# Patient Record
Sex: Male | Born: 1965
Health system: Southern US, Community
[De-identification: ages and names within clinical notes are randomized; demographics above are authoritative.]

## PROBLEM LIST (undated history)

## (undated) DIAGNOSIS — R399 Unspecified symptoms and signs involving the genitourinary system: Secondary | ICD-10-CM

## (undated) DIAGNOSIS — K449 Diaphragmatic hernia without obstruction or gangrene: Secondary | ICD-10-CM

## (undated) DIAGNOSIS — N201 Calculus of ureter: Secondary | ICD-10-CM

## (undated) DIAGNOSIS — Z9989 Dependence on other enabling machines and devices: Secondary | ICD-10-CM

## (undated) DIAGNOSIS — I4510 Unspecified right bundle-branch block: Secondary | ICD-10-CM

## (undated) DIAGNOSIS — K219 Gastro-esophageal reflux disease without esophagitis: Secondary | ICD-10-CM

## (undated) DIAGNOSIS — J189 Pneumonia, unspecified organism: Secondary | ICD-10-CM

## (undated) DIAGNOSIS — I2510 Atherosclerotic heart disease of native coronary artery without angina pectoris: Secondary | ICD-10-CM

## (undated) DIAGNOSIS — G4733 Obstructive sleep apnea (adult) (pediatric): Secondary | ICD-10-CM

## (undated) DIAGNOSIS — J309 Allergic rhinitis, unspecified: Secondary | ICD-10-CM

## (undated) DIAGNOSIS — T7840XA Allergy, unspecified, initial encounter: Secondary | ICD-10-CM

## (undated) DIAGNOSIS — E785 Hyperlipidemia, unspecified: Secondary | ICD-10-CM

## (undated) DIAGNOSIS — D649 Anemia, unspecified: Secondary | ICD-10-CM

## (undated) DIAGNOSIS — Z87442 Personal history of urinary calculi: Secondary | ICD-10-CM

## (undated) DIAGNOSIS — N529 Male erectile dysfunction, unspecified: Secondary | ICD-10-CM

## (undated) DIAGNOSIS — Z862 Personal history of diseases of the blood and blood-forming organs and certain disorders involving the immune mechanism: Secondary | ICD-10-CM

## (undated) DIAGNOSIS — G4730 Sleep apnea, unspecified: Secondary | ICD-10-CM

## (undated) DIAGNOSIS — N200 Calculus of kidney: Secondary | ICD-10-CM

## (undated) DIAGNOSIS — N400 Enlarged prostate without lower urinary tract symptoms: Secondary | ICD-10-CM

## (undated) HISTORY — PX: COLONOSCOPY: SHX174

## (undated) HISTORY — PX: POLYPECTOMY: SHX149

## (undated) HISTORY — DX: Allergy, unspecified, initial encounter: T78.40XA

## (undated) HISTORY — DX: Obstructive sleep apnea (adult) (pediatric): G47.33

## (undated) HISTORY — DX: Anemia, unspecified: D64.9

## (undated) HISTORY — DX: Sleep apnea, unspecified: G47.30

---

## 1997-05-29 HISTORY — PX: FOOT GANGLION EXCISION: SHX1660

## 2004-06-27 ENCOUNTER — Ambulatory Visit: Payer: Self-pay | Admitting: Internal Medicine

## 2004-11-01 ENCOUNTER — Ambulatory Visit: Payer: Self-pay | Admitting: Internal Medicine

## 2005-09-14 ENCOUNTER — Ambulatory Visit: Payer: Self-pay | Admitting: Internal Medicine

## 2006-06-28 ENCOUNTER — Ambulatory Visit: Payer: Self-pay | Admitting: Internal Medicine

## 2006-12-28 ENCOUNTER — Ambulatory Visit: Payer: Self-pay | Admitting: Internal Medicine

## 2006-12-28 DIAGNOSIS — K219 Gastro-esophageal reflux disease without esophagitis: Secondary | ICD-10-CM

## 2006-12-28 DIAGNOSIS — L255 Unspecified contact dermatitis due to plants, except food: Secondary | ICD-10-CM | POA: Insufficient documentation

## 2006-12-28 HISTORY — DX: Gastro-esophageal reflux disease without esophagitis: K21.9

## 2006-12-31 ENCOUNTER — Emergency Department (HOSPITAL_COMMUNITY): Admission: EM | Admit: 2006-12-31 | Discharge: 2006-12-31 | Payer: Self-pay | Admitting: Emergency Medicine

## 2007-01-01 ENCOUNTER — Ambulatory Visit: Payer: Self-pay | Admitting: Internal Medicine

## 2007-01-01 DIAGNOSIS — J309 Allergic rhinitis, unspecified: Secondary | ICD-10-CM | POA: Insufficient documentation

## 2007-07-01 DIAGNOSIS — J069 Acute upper respiratory infection, unspecified: Secondary | ICD-10-CM | POA: Insufficient documentation

## 2007-07-08 ENCOUNTER — Ambulatory Visit: Payer: Self-pay | Admitting: Family Medicine

## 2008-06-23 ENCOUNTER — Telehealth: Payer: Self-pay | Admitting: Internal Medicine

## 2008-11-11 ENCOUNTER — Telehealth: Payer: Self-pay | Admitting: Internal Medicine

## 2009-05-11 ENCOUNTER — Ambulatory Visit: Payer: Self-pay | Admitting: Internal Medicine

## 2009-05-11 DIAGNOSIS — J019 Acute sinusitis, unspecified: Secondary | ICD-10-CM

## 2010-04-25 ENCOUNTER — Ambulatory Visit: Payer: Self-pay | Admitting: Internal Medicine

## 2010-06-28 NOTE — Assessment & Plan Note (Signed)
Summary: sinuses//ccm   Vital Signs:  Patient profile:   45 year old male Weight:      186 pounds Temp:     98.1 degrees F oral BP sitting:   110 / 80  (left arm) Cuff size:   regular  Vitals Entered By: Duard Brady LPN (April 25, 2010 1:47 PM) CC: c/o head congestion, productive cough, ears stopped up Is Patient Diabetic? No   CC:  c/o head congestion, productive cough, and ears stopped up.  History of Present Illness: 45 year old patient has a history of seasonal allergic rhinitis.  He was treated about this time.  Last year and has recurrence of sinus congestion, pressure, minimal cough and rhinorrhea for the past several weeks.  He has been using OTC medications with marginal benefit.  There's been no fever or dental pain.  Denies any purulent drainage.  Denies any chest pain, shortness of breath or wheezing  Allergies: 1)  ! Penicillin G Potassium (Penicillin G Potassium) 2)  ! Sulf-10  Past History:  Past Medical History: Reviewed history from 01/01/2007 and no changes required. G E R D Allergic rhinitis  Physical Exam  General:  Well-developed,well-nourished,in no acute distress; alert,appropriate and cooperative throughout examination Head:  Normocephalic and atraumatic without obvious abnormalities. No apparent alopecia or balding. Eyes:  No corneal or conjunctival inflammation noted. EOMI. Perrla. Funduscopic exam benign, without hemorrhages, exudates or papilledema. Vision grossly normal. Ears:  External ear exam shows no significant lesions or deformities.  Otoscopic examination reveals clear canals, tympanic membranes are intact bilaterally without bulging, retraction, inflammation or discharge. Hearing is grossly normal bilaterally. Nose:  External nasal examination shows no deformity or inflammation. Nasal mucosa are pink and moist without lesions or exudates. Mouth:  Oral mucosa and oropharynx without lesions or exudates.  Teeth in good repair. Neck:   No deformities, masses, or tenderness noted. Lungs:  Normal respiratory effort, chest expands symmetrically. Lungs are clear to auscultation, no crackles or wheezes. Heart:  Normal rate and regular rhythm. S1 and S2 normal without gallop, murmur, click, rub or other extra sounds.   Impression & Recommendations:  Problem # 1:  ALLERGIC RHINITIS (ICD-477.9)  His updated medication list for this problem includes:    Fluticasone Propionate 50 Mcg/act Susp (Fluticasone propionate) ..... Use daily  His updated medication list for this problem includes:    Fluticasone Propionate 50 Mcg/act Susp (Fluticasone propionate) ..... Use daily  Complete Medication List: 1)  Prilosec 20 Mg Cpdr (Omeprazole) .Marland Kitchen.. 1 once daily 2)  Allegra-d 12 Hour 60-120 Mg Tb12 (Fexofenadine-pseudoephedrine) .Marland Kitchen.. 1 two times a day as needed 3)  Fluticasone Propionate 50 Mcg/act Susp (Fluticasone propionate) .... Use daily 4)  Prednisone 20 Mg Tabs (Prednisone) .... One twice daily  Other Orders: Depo- Medrol 80mg  (J1040) Admin of Therapeutic Inj  intramuscular or subcutaneous (29562)  Patient Instructions: 1)  Please schedule a follow-up appointment as needed. Prescriptions: FLUTICASONE PROPIONATE 50 MCG/ACT SUSP (FLUTICASONE PROPIONATE) use daily  #1 x 4   Entered and Authorized by:   Gordy Savers  MD   Signed by:   Gordy Savers  MD on 04/25/2010   Method used:   Print then Give to Patient   RxID:   [id number] PREDNISONE 20 MG TABS (PREDNISONE) one twice daily  #14 x 0   Entered and Authorized by:   Gordy Savers  MD   Signed by:   Gordy Savers  MD on 04/25/2010   Method used:   Print then  Give to Patient   RxID:   [id number] FLUTICASONE PROPIONATE 50 MCG/ACT SUSP (FLUTICASONE PROPIONATE) use daily  #1 x 4   Entered and Authorized by:   Gordy Savers  MD   Signed by:   Gordy Savers  MD on 04/25/2010   Method used:   Electronically to        [facility] [address] [id number]* (retail)       [address]       [place], [place]  [address]       Ph: [phone]       Fax: [phone]   RxID:   [id number]    Medication Administration  Injection # 1:    Medication: Depo- Medrol 80mg     Diagnosis: SINUSITIS- ACUTE-NOS (ICD-461.9)    Route: IM    Site: R deltoid    Exp Date: 11/2012    Lot #: [id number] [id number]    Mfr: Pharmacia    Patient tolerated injection without complications    Given by: Duard Brady LPN (April 25, 2010 2:28 PM)  Orders Added: 1)  Est. Patient Level III [93235] 2)  Depo- Medrol 80mg  [J1040] 3)  Admin of Therapeutic Inj  intramuscular or subcutaneous [57322]

## 2010-06-29 ENCOUNTER — Telehealth: Payer: Self-pay | Admitting: Internal Medicine

## 2010-06-29 DIAGNOSIS — K219 Gastro-esophageal reflux disease without esophagitis: Secondary | ICD-10-CM

## 2010-06-29 MED ORDER — OMEPRAZOLE 20 MG PO CPDR: 20.0000 mg | DELAYED_RELEASE_CAPSULE | Freq: Every day | ORAL | Status: DC

## 2010-06-29 NOTE — Telephone Encounter (Signed)
REFILL REQUEST:  OMEPRAZOLE

## 2010-07-01 ENCOUNTER — Other Ambulatory Visit: Payer: Self-pay | Admitting: Internal Medicine

## 2010-07-01 DIAGNOSIS — K219 Gastro-esophageal reflux disease without esophagitis: Secondary | ICD-10-CM

## 2010-07-01 MED ORDER — OMEPRAZOLE 20 MG PO CPDR: 20.0000 mg | DELAYED_RELEASE_CAPSULE | Freq: Every day | ORAL | Status: DC

## 2010-07-01 NOTE — Telephone Encounter (Signed)
Pt wants Rx for omeprazole sent to [facility]...... 90-day supply....was supposed to have been sent to [facility] not [facility].

## 2010-07-06 ENCOUNTER — Telehealth: Payer: Self-pay | Admitting: Internal Medicine

## 2010-07-06 DIAGNOSIS — K219 Gastro-esophageal reflux disease without esophagitis: Secondary | ICD-10-CM

## 2010-07-06 MED ORDER — OMEPRAZOLE 20 MG PO CPDR: 20.0000 mg | DELAYED_RELEASE_CAPSULE | Freq: Every day | ORAL | Status: DC

## 2010-07-06 NOTE — Telephone Encounter (Signed)
Pt called and said that Omeprazole was sent to incorrect pharmacy. Pt is req to pick up written script at office so he cant send to [facility]. Pt req 28month supply for 1 year. Pls notify pt when script is ready for pick up.

## 2010-07-06 NOTE — Telephone Encounter (Signed)
Pt states [facility] did not recv omeprazole efill . I resent rx today - instructed pt to check with [facility] in 2 hours to see if they recv'd it

## 2010-07-12 ENCOUNTER — Other Ambulatory Visit: Payer: Self-pay | Admitting: Internal Medicine

## 2010-07-12 DIAGNOSIS — K219 Gastro-esophageal reflux disease without esophagitis: Secondary | ICD-10-CM

## 2010-08-25 ENCOUNTER — Encounter: Payer: Self-pay | Admitting: Internal Medicine

## 2010-08-25 ENCOUNTER — Ambulatory Visit (INDEPENDENT_AMBULATORY_CARE_PROVIDER_SITE_OTHER): Payer: 59 | Admitting: Internal Medicine

## 2010-08-25 VITALS — BP 110/72 | Temp 98.5°F | Wt 189.0 lb

## 2010-08-25 DIAGNOSIS — S76219A Strain of adductor muscle, fascia and tendon of unspecified thigh, initial encounter: Secondary | ICD-10-CM

## 2010-08-25 DIAGNOSIS — IMO0002 Reserved for concepts with insufficient information to code with codable children: Secondary | ICD-10-CM

## 2010-08-25 NOTE — Progress Notes (Signed)
  Subjective:    Patient ID: Lucas Rocha, male    DOB: 01-04-1966, 45 y.o.   MRN: [id number]  HPI  45 year old patient who presents with right groin pain of 2-3 days duration. He he feels this began after he started jogging with his daughter. He describes a sharp localized paroxysmal pain in the right groin that is intermittent. Symptoms seem  to be aggravated by taking a longer stride than  normal. Review of Systems  Constitutional: Negative for fever, chills, appetite change and fatigue.  HENT: Negative for hearing loss, ear pain, congestion, sore throat, trouble swallowing, neck stiffness, dental problem, voice change and tinnitus.   Eyes: Negative for pain, discharge and visual disturbance.  Respiratory: Negative for cough, chest tightness, wheezing and stridor.   Cardiovascular: Negative for chest pain, palpitations and leg swelling.  Gastrointestinal: Negative for nausea, vomiting, abdominal pain, diarrhea, constipation, blood in stool and abdominal distention.  Genitourinary: Negative for urgency, hematuria, flank pain, discharge, difficulty urinating and genital sores.  Musculoskeletal: Positive for gait problem. Negative for myalgias, back pain, joint swelling and arthralgias.  Skin: Negative for rash.  Neurological: Negative for dizziness, syncope, speech difficulty, weakness, numbness and headaches.  Hematological: Negative for adenopathy. Does not bruise/bleed easily.  Psychiatric/Behavioral: Negative for behavioral problems and dysphoric mood. The patient is not nervous/anxious.        Objective:   Physical Exam  Constitutional: He appears well-developed and well-nourished. No distress.  Abdominal: Soft. Bowel sounds are normal.       Examination of the right groin area revealed no abnormalities. There is no evidence of hernia formation and no localized tenderness no signs of active inflammation Genitourinary exam revealed no evidence of a hernia          Assessment &  Plan:  Right groin strain. Will treat with rest gentle stretching and anti-inflammatory medications. Patient will notify us if there is not clinical improvement quickly

## 2010-08-25 NOTE — Patient Instructions (Signed)
Take Aleve 200 mg twice daily for pain or swelling  Rest and avoid strenuous activities Gentle stretching prior to exercise  Call or return to clinic prn if these symptoms worsen or fail to improve as anticipated.

## 2010-11-10 ENCOUNTER — Encounter: Payer: Self-pay | Admitting: Internal Medicine

## 2010-11-10 ENCOUNTER — Ambulatory Visit (INDEPENDENT_AMBULATORY_CARE_PROVIDER_SITE_OTHER): Payer: 59 | Admitting: Internal Medicine

## 2010-11-10 VITALS — BP 130/80 | Temp 99.9°F | Wt 180.0 lb

## 2010-11-10 DIAGNOSIS — A088 Other specified intestinal infections: Secondary | ICD-10-CM

## 2010-11-10 DIAGNOSIS — A084 Viral intestinal infection, unspecified: Secondary | ICD-10-CM

## 2010-11-10 MED ORDER — DIPHENOXYLATE-ATROPINE 2.5-0.025 MG PO TABS: 1.0000 | ORAL_TABLET | Freq: Four times a day (QID) | ORAL | Status: DC | PRN

## 2010-11-10 NOTE — Patient Instructions (Signed)
The main concern with gastroenteritis is dehydration.  Drink  plenty of  fluids, and advance your diet  slowly to solids as you feel improved.  If you are unable to keep anything down or  Show  signs of dehydration such as dry, cracked lips, or  not urinating, please notify our office.  Call or return to clinic prn if these symptoms worsen or fail to improve as anticipated.

## 2010-11-10 NOTE — Progress Notes (Signed)
  Subjective:    Patient ID: Lucas Rocha, male    DOB: 1966/05/11, 45 y.o.   MRN: [id number]  HPI   45 year old patient who presents with a five-day history of diarrhea.  He has had no vomiting but slight nausea. No significant abdominal pain. Has noticed some mild cramping associated with the diarrhea. He had nocturnal diarrhea last night x2. He has felt  a bit unwell.  No recent antibiotic use. No GI illness in the household. He has taken Pepto-Bismol recently with some improvement   Review of Systems  Constitutional: Negative for fever, chills, appetite change and fatigue.  HENT: Negative for hearing loss, ear pain, congestion, sore throat, trouble swallowing, neck stiffness, dental problem, voice change and tinnitus.   Eyes: Negative for pain, discharge and visual disturbance.  Respiratory: Negative for cough, chest tightness, wheezing and stridor.   Cardiovascular: Negative for chest pain, palpitations and leg swelling.  Gastrointestinal: Positive for diarrhea. Negative for nausea, vomiting, abdominal pain, constipation, blood in stool and abdominal distention.  Genitourinary: Negative for urgency, hematuria, flank pain, discharge, difficulty urinating and genital sores.  Musculoskeletal: Negative for myalgias, back pain, joint swelling, arthralgias and gait problem.  Skin: Negative for rash.  Neurological: Negative for dizziness, syncope, speech difficulty, weakness, numbness and headaches.  Hematological: Negative for adenopathy. Does not bruise/bleed easily.  Psychiatric/Behavioral: Negative for behavioral problems and dysphoric mood. The patient is not nervous/anxious.        Objective:   Physical Exam  Constitutional: He is oriented to person, place, and time. He appears well-developed.       Temperature 99.9. Blood pressure 130/80. Pulse 80  HENT:  Head: Normocephalic.  Right Ear: External ear normal.  Left Ear: External ear normal.  Mouth/Throat: Oropharynx is clear and  moist.  Eyes: Conjunctivae and EOM are normal.  Neck: Normal range of motion.  Cardiovascular: Normal rate and normal heart sounds.   Pulmonary/Chest: Breath sounds normal.  Abdominal: Soft. Bowel sounds are normal. He exhibits no distension. There is no tenderness. There is no guarding.  Musculoskeletal: Normal range of motion. He exhibits no edema and no tenderness.  Neurological: He is alert and oriented to person, place, and time.  Psychiatric: He has a normal mood and affect. His behavior is normal.          Assessment & Plan:   Viral gastroenteritis. Will continue Pepto-Bismol. We'll provide a prescription for antidiarrheal. We'll treat with ALIGN for 7 days and continue PPI therapy.

## 2011-06-14 ENCOUNTER — Other Ambulatory Visit: Payer: Self-pay | Admitting: Internal Medicine

## 2011-10-24 ENCOUNTER — Ambulatory Visit (INDEPENDENT_AMBULATORY_CARE_PROVIDER_SITE_OTHER): Payer: 59 | Admitting: Internal Medicine

## 2011-10-24 ENCOUNTER — Encounter: Payer: Self-pay | Admitting: Internal Medicine

## 2011-10-24 VITALS — BP 116/70 | HR 90 | Temp 98.5°F | Resp 18 | Ht 71.0 in | Wt 177.0 lb

## 2011-10-24 DIAGNOSIS — D509 Iron deficiency anemia, unspecified: Secondary | ICD-10-CM

## 2011-10-24 DIAGNOSIS — Z23 Encounter for immunization: Secondary | ICD-10-CM

## 2011-10-24 DIAGNOSIS — K219 Gastro-esophageal reflux disease without esophagitis: Secondary | ICD-10-CM

## 2011-10-24 DIAGNOSIS — J309 Allergic rhinitis, unspecified: Secondary | ICD-10-CM

## 2011-10-24 DIAGNOSIS — D649 Anemia, unspecified: Secondary | ICD-10-CM

## 2011-10-24 DIAGNOSIS — Z299 Encounter for prophylactic measures, unspecified: Secondary | ICD-10-CM

## 2011-10-24 HISTORY — DX: Iron deficiency anemia, unspecified: D50.9

## 2011-10-24 LAB — CBC WITH DIFFERENTIAL/PLATELET
Eosinophils Absolute: 0.2 10*3/uL (ref 0.0–0.7)
HCT: 33.7 % — ABNORMAL LOW (ref 39.0–52.0)
Lymphs Abs: 1.6 10*3/uL (ref 0.7–4.0)
MCHC: 30.9 g/dL (ref 30.0–36.0)
MCV: 75.5 fl — ABNORMAL LOW (ref 78.0–100.0)
Monocytes Absolute: 0.6 10*3/uL (ref 0.1–1.0)
Neutrophils Relative %: 67.2 % (ref 43.0–77.0)
Platelets: 344 10*3/uL (ref 150.0–400.0)

## 2011-10-24 LAB — FERRITIN: Ferritin: 8 ng/mL — ABNORMAL LOW (ref 22.0–322.0)

## 2011-10-24 MED ORDER — OMEPRAZOLE 20 MG PO CPDR: 20.0000 mg | DELAYED_RELEASE_CAPSULE | Freq: Every day | ORAL | Status: DC

## 2011-10-24 MED ORDER — FLUTICASONE PROPIONATE 50 MCG/ACT NA SUSP: 2.0000 | Freq: Every day | NASAL | Status: DC

## 2011-10-24 NOTE — Progress Notes (Signed)
Subjective:    Patient ID: Lucas Rocha, male    DOB: 1966/02/12, 46 y.o.   MRN: [id number]  HPI  46 year old patient who is seen today for a health maintenance examination. He had a employment physical done recently that revealed the moderate anemia.  Recent hemoglobin was noted to be 10.8. At the time of his last employment physical in May of 2011 hemoglobin 14.6.  He does complain of some diminished exercise capacity and some chronic fatigue. Prior to the diagnosis of anemia he was exercising vigorously 4 times per week but again noted some poor exercise capacity he also noted some episodes of weakness and dizziness during exercise. He has no prior history of anemia and no family history of anemia or any hematologic disorder. He does donate blood on the average of twice annually. In January or February of this year his hematocrit was borderline low but his blood was accepted for donation. In the fall of last year he was not able to donate blood and apparently due to anemia. No change in his bowel habits He does have a history of GERD and does use Prilosec daily. He has rare anti-inflammatory drug use once or twice per month laboratory screen otherwise was unremarkable.  Both parents are approximate age of 2 and in good health there is a history of diabetes with the grandparents on both sides affected. One sister is well  Past Medical History  Diagnosis Date  . ALLERGIC RHINITIS 01/01/2007  . G E R D 12/28/2006    History   Social History  . Marital Status: Married    Spouse Name: N/A    Number of Children: N/A  . Years of Education: N/A   Occupational History  . Not on file.   Social History Main Topics  . Smoking status: Never Smoker   . Smokeless tobacco: Never Used  . Alcohol Use: Not on file  . Drug Use: Not on file  . Sexually Active: Not on file   Other Topics Concern  . Not on file   Social History Narrative  . No narrative on file    Past Surgical History    Procedure Date  . Ganglion cyst excision     foot    History reviewed. No pertinent family history.  Allergies  Allergen Reactions  . Penicillins   . Sulfacetamide Sodium     Current Outpatient Prescriptions on File Prior to Visit  Medication Sig Dispense Refill  . fexofenadine (ALLEGRA) 180 MG tablet Take 180 mg by mouth 2 (two) times daily as needed.        . fluticasone (FLONASE) 50 MCG/ACT nasal spray 2 sprays by Nasal route daily.        Marland Kitchen omeprazole (PRILOSEC) 20 MG capsule TAKE 1 CAPSULE DAILY  90 capsule  1    BP 116/70  Pulse 90  Temp(Src) 98.5 F (36.9 C) (Oral)  Resp 18  Ht 5\' 11"  (1.803 m)  Wt 177 lb (80.287 kg)  BMI 24.69 kg/m2  SpO2 98%        Review of Systems  Constitutional: Positive for fatigue. Negative for fever, chills, activity change and appetite change.  HENT: Negative for hearing loss, ear pain, congestion, rhinorrhea, sneezing, mouth sores, trouble swallowing, neck pain, neck stiffness, dental problem, voice change, sinus pressure and tinnitus.   Eyes: Negative for photophobia, pain, redness and visual disturbance.  Respiratory: Negative for apnea, cough, choking, chest tightness, shortness of breath and wheezing.   Cardiovascular: Negative for  chest pain, palpitations and leg swelling.  Gastrointestinal: Negative for nausea, vomiting, abdominal pain, diarrhea, constipation, blood in stool, abdominal distention, anal bleeding and rectal pain.  Genitourinary: Negative for dysuria, urgency, frequency, hematuria, flank pain, decreased urine volume, discharge, penile swelling, scrotal swelling, difficulty urinating, genital sores and testicular pain.  Musculoskeletal: Negative for myalgias, back pain, joint swelling, arthralgias and gait problem.  Skin: Negative for color change, rash and wound.  Neurological: Positive for light-headedness. Negative for dizziness, tremors, seizures, syncope, facial asymmetry, speech difficulty, weakness, numbness  and headaches.  Hematological: Negative for adenopathy. Does not bruise/bleed easily.  Psychiatric/Behavioral: Negative for suicidal ideas, hallucinations, behavioral problems, confusion, sleep disturbance, self-injury, dysphoric mood, decreased concentration and agitation. The patient is not nervous/anxious.        Objective:   Physical Exam  Constitutional: He appears well-developed and well-nourished.  HENT:  Head: Normocephalic and atraumatic.  Right Ear: External ear normal.  Left Ear: External ear normal.  Nose: Nose normal.  Mouth/Throat: Oropharynx is clear and moist.  Eyes: Conjunctivae and EOM are normal. Pupils are equal, round, and reactive to light. No scleral icterus.  Neck: Normal range of motion. Neck supple. No JVD present. No thyromegaly present.  Cardiovascular: Regular rhythm, normal heart sounds and intact distal pulses.  Exam reveals no gallop and no friction rub.   No murmur heard. Pulmonary/Chest: Effort normal and breath sounds normal. He exhibits no tenderness.  Abdominal: Soft. Bowel sounds are normal. He exhibits no distension and no mass. There is no tenderness.  Genitourinary: Rectum normal, prostate normal and penis normal. Guaiac negative stool.  Musculoskeletal: Normal range of motion. He exhibits no edema and no tenderness.  Lymphadenopathy:    He has no cervical adenopathy.  Neurological: He is alert. He has normal reflexes. No cranial nerve deficit. Coordination normal.  Skin: Skin is warm and dry. No rash noted.  Psychiatric: He has a normal mood and affect. His behavior is normal.          Assessment & Plan:   Preventive health examination Moderate anemia. Will check stool for occult blood;  check a ferritin and B12 level Consider GI evaluation

## 2011-10-24 NOTE — Patient Instructions (Signed)
Return in one month for follow-up  

## 2011-10-25 ENCOUNTER — Other Ambulatory Visit: Payer: Self-pay | Admitting: Internal Medicine

## 2011-10-25 DIAGNOSIS — D509 Iron deficiency anemia, unspecified: Secondary | ICD-10-CM

## 2011-10-25 NOTE — Progress Notes (Signed)
Quick Note:  Spoke with wife - informed of results and GI consult to be done - order placed ______

## 2011-10-26 LAB — METHYLMALONIC ACID, SERUM: Methylmalonic Acid, Quant: 0.17 umol/L (ref <0.40)

## 2011-10-27 ENCOUNTER — Encounter: Payer: Self-pay | Admitting: Internal Medicine

## 2011-11-08 ENCOUNTER — Other Ambulatory Visit: Payer: Self-pay

## 2011-11-08 MED ORDER — OMEPRAZOLE 20 MG PO CPDR: 20.0000 mg | DELAYED_RELEASE_CAPSULE | Freq: Every day | ORAL | Status: DC

## 2011-11-08 NOTE — Telephone Encounter (Signed)
Attempt to call at # left by pt - sent new rx to [facility] - check with them later today to make sure they got it

## 2011-11-17 ENCOUNTER — Ambulatory Visit: Payer: 59 | Admitting: Internal Medicine

## 2011-11-24 ENCOUNTER — Encounter: Payer: Self-pay | Admitting: Internal Medicine

## 2011-11-27 ENCOUNTER — Encounter: Payer: Self-pay | Admitting: Internal Medicine

## 2011-11-27 ENCOUNTER — Ambulatory Visit (INDEPENDENT_AMBULATORY_CARE_PROVIDER_SITE_OTHER): Payer: 59 | Admitting: Internal Medicine

## 2011-11-27 ENCOUNTER — Other Ambulatory Visit (INDEPENDENT_AMBULATORY_CARE_PROVIDER_SITE_OTHER): Payer: 59

## 2011-11-27 VITALS — BP 118/64 | HR 80 | Ht 71.0 in | Wt 180.0 lb

## 2011-11-27 DIAGNOSIS — K219 Gastro-esophageal reflux disease without esophagitis: Secondary | ICD-10-CM

## 2011-11-27 DIAGNOSIS — D509 Iron deficiency anemia, unspecified: Secondary | ICD-10-CM

## 2011-11-27 LAB — IGA: IgA: 400 mg/dL — ABNORMAL HIGH (ref 68–378)

## 2011-11-27 LAB — TSH: TSH: 0.73 u[IU]/mL (ref 0.35–5.50)

## 2011-11-27 MED ORDER — INTEGRA PLUS PO CAPS: 1.0000 | ORAL_CAPSULE | Freq: Every day | ORAL | Status: DC

## 2011-11-27 NOTE — Patient Instructions (Addendum)
You have been scheduled for a Endoscopy/ colonoscopy with propofol. Please follow written instructions given to you at your visit today.  Please pick up your prep kit at the pharmacy within the next 1-3 days.  Your physician has requested that you go to the basement for  lab work before leaving today.  We have sent the following medications to your pharmacy for you to pick up at your convenience:integra; take as directed.

## 2011-11-27 NOTE — Progress Notes (Signed)
Patient ID: Lucas Rocha, male   DOB: 05-12-1966, 46 y.o.   MRN: [id number]  SUBJECTIVE: HPI Lucas Rocha is a 46 year old male with a past medical history of GERD, allergic rhinitis, and a relatively new anemia who seen in consultation at the request of Dr. Amador Cunas for evaluation of iron deficiency anemia. Patient reports he is feeling well has had no complaints. He reports first being told about his anemia at a work-related physical. He then attempted to donate blood, and was told he was anemic with low iron and therefore could not donate again. He denies any GI bleeding. He reports no bright red blood per rectum or melena. He reports no abdominal pain. He does have a history of heartburn, but this is completely controlled with once daily omeprazole. He denies dysphagia, odynophagia, nausea, vomiting. His appetite is good. His weight is stable. His bowel habits are normal for him. No fevers or chills. No blood in his urine. No easy bruising.  He had noted some fatigue when trying to work out.  Review of Systems  As per history of present illness, otherwise negative   Past Medical History  Diagnosis Date  . ALLERGIC RHINITIS 01/01/2007  . G E R D 12/28/2006  . Anemia     Current Outpatient Prescriptions  Medication Sig Dispense Refill  . ferrous sulfate 325 (65 FE) MG tablet Take 325 mg by mouth. Will take but not all the time      . fexofenadine (ALLEGRA) 180 MG tablet Take 180 mg by mouth 2 (two) times daily as needed.        . fluticasone (FLONASE) 50 MCG/ACT nasal spray Place 2 sprays into the nose daily.  16 g  6  . omeprazole (PRILOSEC) 20 MG capsule Take 1 capsule (20 mg total) by mouth daily.  90 capsule  6    Allergies  Allergen Reactions  . Penicillins   . Sulfacetamide Sodium     Family History  Problem Relation Age of Onset  . Colon cancer Neg Hx   . Diabetes Maternal Grandfather   . Heart disease      Dad's side     History  Substance Use Topics  . Smoking  status: Never Smoker   . Smokeless tobacco: Never Used  . Alcohol Use: No    OBJECTIVE: BP 118/64  Pulse 80  Ht 5\' 11"  (1.803 m)  Wt 180 lb (81.647 kg)  BMI 25.10 kg/m2 Constitutional: Well-developed and well-nourished. No distress. HEENT: Normocephalic and atraumatic. Oropharynx is clear and moist. No oropharyngeal exudate. Conjunctivae are normal. Pupils are equal round and reactive to light. No scleral icterus. Neck: Neck supple. Trachea midline. Cardiovascular: Normal rate, regular rhythm and intact distal pulses. No M/R/G Pulmonary/chest: Effort normal and breath sounds normal. No wheezing, rales or rhonchi. Abdominal: Soft, nontender, nondistended. Bowel sounds active throughout. There are no masses palpable. No hepatosplenomegaly. Extremities: no clubbing, cyanosis, or edema Lymphadenopathy: No cervical adenopathy noted. Neurological: Alert and oriented to person place and time. Skin: Skin is warm and dry. No rashes noted. Psychiatric: Normal mood and affect. Behavior is normal.  Labs and Imaging -- Iron/TIBC/Ferritin    Component Value Date/Time   FERRITIN 8.0* 10/24/2011 1421   CBC    Component Value Date/Time   WBC 7.1 10/24/2011 1421   RBC 4.46 10/24/2011 1421   HGB 10.4* 10/24/2011 1421   HCT 33.7* 10/24/2011 1421   PLT 344.0 10/24/2011 1421   MCV 75.5* 10/24/2011 1421   MCHC 30.9 10/24/2011  1421   RDW 16.9* 10/24/2011 1421   LYMPHSABS 1.6 10/24/2011 1421   MONOABS 0.6 10/24/2011 1421   EOSABS 0.2 10/24/2011 1421   BASOSABS 0.0 10/24/2011 1421    ASSESSMENT AND PLAN: Lucas Rocha is a 46 year old male with a past medical history of GERD, allergic rhinitis, and a relatively new anemia who seen in consultation at the request of Dr. Amador Cunas for evaluation of iron deficiency anemia.  1. IDA -- patient is currently asymptomatic from a GI standpoint, but definitely has an iron deficiency anemia. Given the fact that he is male, and this is felt to be relatively new, it  definitely warrants further investigation. I will order a celiac panel today as well as a TSH. His celiac panel is negative, we will proceed to EGD and colonoscopy for further evaluation of iron deficiency anemia. We discussed the test today including the risks and benefits and he is agreeable to proceed. If both of these were negative, we also discussed the possibility of a later video capsule endoscopy. He understands the workup and my recommendations. I have asked that he start Integra for iron replacement 1 capsule daily. He will take this for 3 months, and we can recheck his iron stores. Further recommendations after labs and procedures.  2. GERD -- his heartburn and GERD symptoms are well controlled on omeprazole. He'll continue omeprazole 20 mg daily for now.

## 2011-11-28 ENCOUNTER — Ambulatory Visit (AMBULATORY_SURGERY_CENTER): Payer: 59 | Admitting: Internal Medicine

## 2011-11-28 ENCOUNTER — Encounter: Payer: Self-pay | Admitting: Internal Medicine

## 2011-11-28 ENCOUNTER — Other Ambulatory Visit: Payer: Self-pay | Admitting: Internal Medicine

## 2011-11-28 VITALS — BP 117/81 | HR 83 | Temp 97.8°F | Resp 22 | Ht 71.0 in | Wt 180.0 lb

## 2011-11-28 DIAGNOSIS — D509 Iron deficiency anemia, unspecified: Secondary | ICD-10-CM

## 2011-11-28 DIAGNOSIS — D126 Benign neoplasm of colon, unspecified: Secondary | ICD-10-CM

## 2011-11-28 DIAGNOSIS — K317 Polyp of stomach and duodenum: Secondary | ICD-10-CM

## 2011-11-28 DIAGNOSIS — K635 Polyp of colon: Secondary | ICD-10-CM

## 2011-11-28 DIAGNOSIS — K219 Gastro-esophageal reflux disease without esophagitis: Secondary | ICD-10-CM

## 2011-11-28 DIAGNOSIS — D1330 Benign neoplasm of unspecified part of small intestine: Secondary | ICD-10-CM

## 2011-11-28 DIAGNOSIS — D131 Benign neoplasm of stomach: Secondary | ICD-10-CM

## 2011-11-28 HISTORY — PX: ESOPHAGOGASTRODUODENOSCOPY: SHX1529

## 2011-11-28 MED ORDER — SODIUM CHLORIDE 0.9 % IV SOLN: 500.0000 mL | INTRAVENOUS | Status: DC

## 2011-11-28 MED ORDER — PANTOPRAZOLE SODIUM 40 MG PO TBEC: 40.0000 mg | DELAYED_RELEASE_TABLET | Freq: Every day | ORAL | Status: DC

## 2011-11-28 NOTE — Patient Instructions (Addendum)
YOU HAD AN ENDOSCOPIC PROCEDURE TODAY AT THE [facility]: Refer to the procedure report that was given to you for any specific questions about what was found during the examination.  If the procedure report does not answer your questions, please call your gastroenterologist to clarify.  If you requested that your care partner not be given the details of your procedure findings, then the procedure report has been included in a sealed envelope for you to review at your convenience later.  YOU SHOULD EXPECT: Some feelings of bloating in the abdomen. Passage of more gas than usual.  Walking can help get rid of the air that was put into your GI tract during the procedure and reduce the bloating. If you had a lower endoscopy (such as a colonoscopy or flexible sigmoidoscopy) you may notice spotting of blood in your stool or on the toilet paper. If you underwent a bowel prep for your procedure, then you may not have a normal bowel movement for a few days.  DIET: Your first meal following the procedure should be a light meal and then it is ok to progress to your normal diet.  A half-sandwich or bowl of soup is an example of a good first meal.  Heavy or fried foods are harder to digest and may make you feel nauseous or bloated.  Likewise meals heavy in dairy and vegetables can cause extra gas to form and this can also increase the bloating.  Drink plenty of fluids but you should avoid alcoholic beverages for 24 hours.  ACTIVITY: Your care partner should take you home directly after the procedure.  You should plan to take it easy, moving slowly for the rest of the day.  You can resume normal activity the day after the procedure however you should NOT DRIVE or use heavy machinery for 24 hours (because of the sedation medicines used during the test).    SYMPTOMS TO REPORT IMMEDIATELY: A gastroenterologist can be reached at any hour.  During normal business hours, 8:30 AM to 5:00 PM Monday through Friday,  call [phone].  After hours and on weekends, please call the GI answering service at [phone] who will take a message and have the physician on call contact you.   Following lower endoscopy (colonoscopy or flexible sigmoidoscopy):  Excessive amounts of blood in the stool  Significant tenderness or worsening of abdominal pains  Swelling of the abdomen that is new, acute  Fever of 100F or higher  Following upper endoscopy (EGD)  Vomiting of blood or coffee ground material  New chest pain or pain under the shoulder blades  Painful or persistently difficult swallowing  New shortness of breath  Fever of 100F or higher  Black, tarry-looking stools  FOLLOW UP: If any biopsies were taken you will be contacted by phone or by letter within the next 1-3 weeks.  Call your gastroenterologist if you have not heard about the biopsies in 3 weeks.  Our staff will call the home number listed on your records the next business day following your procedure to check on you and address any questions or concerns that you may have at that time regarding the information given to you following your procedure. This is a courtesy call and so if there is no answer at the home number and we have not heard from you through the emergency physician on call, we will assume that you have returned to your regular daily activities without incident.  SIGNATURES/CONFIDENTIALITY: You and/or your care   partner have signed paperwork which will be entered into your electronic medical record.  These signatures attest to the fact that that the information above on your After Visit Summary has been reviewed and is understood.  Full responsibility of the confidentiality of this discharge information lies with you and/or your care-partner.  

## 2011-11-28 NOTE — Progress Notes (Signed)
Patient did not experience any of the following events: a burn prior to discharge; a fall within the facility; wrong site/side/patient/procedure/implant event; or a hospital transfer or hospital admission upon discharge from the facility. (G8907) Patient did not have preoperative order for IV antibiotic SSI prophylaxis. (G8918)  

## 2011-11-28 NOTE — Op Note (Signed)
[facility] [address] [place], [place]  [address]  ENDOSCOPY PROCEDURE REPORT  PATIENT:  Lucas, Rocha  MR#:  [id number] BIRTHDATE:  Feb 01, 1966, 46 yrs. old  GENDER:  male ENDOSCOPIST:  Carie Caddy. Jamica Woodyard, MD Referred by:  Eleonore Chiquito, M.D. PROCEDURE DATE:  11/28/2011 PROCEDURE:  EGD with biopsy, 43239 ASA CLASS:  Class I INDICATIONS:  iron deficiency anemia MEDICATIONS:    MAC sedation, administered by CRNA, propofol (Diprivan) 300 mg IV TOPICAL ANESTHETIC:  none  DESCRIPTION OF PROCEDURE:   After the risks benefits and alternatives of the procedure were thoroughly explained, informed consent was obtained.  The LB GIF-H180 [id number] endoscope was introduced through the mouth and advanced to the second portion of the duodenum, without limitations.  The instrument was slowly withdrawn as the mucosa was fully examined. <<PROCEDUREIMAGES>>  Mild, LA Grade B esophagitis was found at the gastroesophageal junction.  A 4 cm hiatal hernia was found.  There were multiple sessile polyps identified in the cardia, fundus and gastric body. Multiple biopsies were obtained and sent to pathology for sampling.  The duodenal bulb was normal in appearance, as was the postbulbar duodenum. Multiple biopsies were obtained and sent to pathology to exclude celiac disease.    Retroflexed views revealed findings as previously described.    The scope was then withdrawn from the patient and the procedure completed.  COMPLICATIONS:  None  ENDOSCOPIC IMPRESSION: 1) Mild esophagitis at the gastroesophageal junction 2) Hiatal hernia 3) Polyps, multiple in the cardia, fundus, and gastric body. Several polyps sampled and sent to pathology (likely fundic gland in nature). 4) Normal duodenum  RECOMMENDATIONS: 1) Await pathology results 2) Begin daily PPI for acid suppression given esophagitis (pantoprazole 40 mg daily 30 min - 1 hour before breakfast) 3) Colonoscopy today  Joury Allcorn M.  [name], MD  CC:  Gordy Savers, MD The Patient  n. eSIGNED:   Carie Caddy. Oseph Imburgia at 11/28/2011 10:44 AM  [name], [phone]

## 2011-11-28 NOTE — Op Note (Signed)
[facility] [address] [place], [place]  [address]  COLONOSCOPY PROCEDURE REPORT  PATIENT:  Lucas, Rocha  MR#:  [id number] BIRTHDATE:  05/12/1966, 46 yrs. old  GENDER:  male ENDOSCOPIST:  Carie Caddy. Yeriel Mineo, MD REF. BY:  Eleonore Chiquito, M.D. PROCEDURE DATE:  11/28/2011 PROCEDURE:  Colonoscopy with snare polypectomy, Colon with cold biopsy polypectomy ASA CLASS:  Class I INDICATIONS:  Iron Deficiency Anemia, 1st colonoscopy MEDICATIONS:   MAC sedation, administered by CRNA, propofol (Diprivan) 180 mg IV  DESCRIPTION OF PROCEDURE:   After the risks benefits and alternatives of the procedure were thoroughly explained, informed consent was obtained.  Digital rectal exam was performed and revealed no rectal masses.   The LB CF-H180AL [id number] endoscope was introduced through the anus and advanced to the terminal ileum which was intubated for a short distance, without limitations. The quality of the prep was good, using MoviPrep.  The instrument was then slowly withdrawn as the colon was fully examined. <<PROCEDUREIMAGES>>  FINDINGS:  Normal appearing terminal ileum.  There was a possible polyp seen in the in the proximal transverse colon which was mucus-capped and measured 2 mm. The polyp was removed using cold biopsy forceps.  Two sessile polyps were found in the recto-sigmoid colon. The polyp 2 mm was removed using cold biopsy forceps.  The 5 mm polyp was snared without cautery. Retrieval was successful.  Mild diverticulosis was found in the sigmoid colon. Retroflexed views in the rectum revealed no abnormalities.     The scope was then withdrawn from the cecum and the procedure completed.  COMPLICATIONS:  None ENDOSCOPIC IMPRESSION: 1) Normal terminal ileum. 2) Possible polyp in the proximal transverse colon. Removed and sent to pathology. 3) Two polyps in the recto-sigmoid colon. Removed and sent to pathology. 4) Mild diverticulosis in the sigmoid  colon  RECOMMENDATIONS: 1) Await pathology results. 2) High fiber diet. 3) If the polyps removed today are proven to be adenomatous (pre-cancerous) polyps, you will need a repeat colonoscopy in 5 years. Otherwise you should continue to follow colorectal cancer screening guidelines for "routine risk" patients with colonoscopy in 10 years. You will receive a letter within 1-2 weeks with the results of your biopsy as well as final recommendations. Please call my office if you have not received a letter after 3 weeks. 4) Begin Integra daily now. Repeat iron studies in 3 months.  If pathology from EGD unrevealing, then will pursue video capsule study to complete evaluation for iron deficiency anemia.  Carie Caddy. [name], MD  CC:  Gordy Savers, MD The Patient  n. eSIGNED:   Carie Caddy. [name] at 11/28/2011 10:51 AM  [name], [phone]

## 2011-11-29 ENCOUNTER — Telehealth: Payer: Self-pay | Admitting: *Deleted

## 2011-11-29 NOTE — Telephone Encounter (Signed)
No answer, message left for the patient. 

## 2011-12-04 ENCOUNTER — Ambulatory Visit: Payer: 59 | Admitting: Internal Medicine

## 2011-12-06 ENCOUNTER — Telehealth: Payer: Self-pay | Admitting: Internal Medicine

## 2011-12-06 MED ORDER — TRAMADOL HCL 50 MG PO TABS: 50.0000 mg | ORAL_TABLET | Freq: Four times a day (QID) | ORAL | Status: AC | PRN

## 2011-12-06 MED ORDER — HYOSCYAMINE SULFATE 0.125 MG SL SUBL: 0.1250 mg | SUBLINGUAL_TABLET | SUBLINGUAL | Status: DC | PRN

## 2011-12-06 NOTE — Telephone Encounter (Signed)
Spoke with patient by phone and he reports 24-36 hours of a "knot" and pain in his left abdomen. Initially he felt as if he might be constipated or "bound up" and then last night he developed profuse diarrhea lasting several hours. He had not taken laxative. He did not see any blood or melena in his diarrhea last night, but he has seen small red blood clots today. This has been a small amount of bleeding. No fevers though he has felt cold. He denies pain right now but continues to describe it as a knot-like feeling and an empty feeling. He's had some nausea without vomiting. He ate 3 pieces of toast today without vomiting. No further diarrhea throughout the day.  I think he needs to be seen tomorrow in clinic. I will prescribe Levsin 0.125 mg every 4 hours as needed for cramping/spasm. I will also prescribe tramadol 50 mg to be used every 6 hours as needed for pain not better with Levsin. I've made him aware that should he have further bleeding, worsening pain, fever, vomiting that he should seek care in the ER tonight. He voices understanding.  Aram Beecham please try to schedule with Amy tomorrow for urgent visit, a.m. appointment if available

## 2011-12-06 NOTE — Telephone Encounter (Signed)
Pt called back and stated he is really in pain, still having loose stools with blood. Explained to pt Dr [name] is in procedures and hasn't had time to answer me. If he is that bad, he needs to go to the ER. I have asked Dr [name] to call him before he leaves, but I don't know when it will be. Please go to the ER if the pain is that bad- [facility] ER; pt stated understanding.

## 2011-12-06 NOTE — Telephone Encounter (Signed)
Pt had ECLon 11/28/11 : COLON with a serrated polyp and hyperplastic; EGD with - H.Pylori and sprue. Pt reports yesterday afternoon he got a "knot" in his stomach like he was hungry so he ate something. He woke up at 1 am and stayed on the toilet on and off for 3 hours. He had profuse sweating and thought he was going to pass out. He still has the knot in his stomach and continues to have loose and watery stools that now have mucus and blood in the bowl as well as blood on the paper. Pt denies a fever. He started pantoprazole on July 7 or 8; stopped the omeprazole. Please advise. Thanks.

## 2011-12-07 ENCOUNTER — Encounter: Payer: Self-pay | Admitting: Physician Assistant

## 2011-12-07 ENCOUNTER — Other Ambulatory Visit (INDEPENDENT_AMBULATORY_CARE_PROVIDER_SITE_OTHER): Payer: 59

## 2011-12-07 ENCOUNTER — Ambulatory Visit (INDEPENDENT_AMBULATORY_CARE_PROVIDER_SITE_OTHER)
Admission: RE | Admit: 2011-12-07 | Discharge: 2011-12-07 | Disposition: A | Discharge status: Home / Self Care | Payer: 59 | Source: Ambulatory Visit | Attending: Physician Assistant | Admitting: Physician Assistant

## 2011-12-07 ENCOUNTER — Ambulatory Visit (INDEPENDENT_AMBULATORY_CARE_PROVIDER_SITE_OTHER): Payer: 59 | Admitting: Physician Assistant

## 2011-12-07 ENCOUNTER — Telehealth: Payer: Self-pay | Admitting: *Deleted

## 2011-12-07 VITALS — BP 110/80 | HR 90 | Ht 71.0 in | Wt 174.4 lb

## 2011-12-07 DIAGNOSIS — D126 Benign neoplasm of colon, unspecified: Secondary | ICD-10-CM

## 2011-12-07 DIAGNOSIS — R5383 Other fatigue: Secondary | ICD-10-CM

## 2011-12-07 DIAGNOSIS — Z86010 Personal history of colonic polyps: Secondary | ICD-10-CM | POA: Insufficient documentation

## 2011-12-07 DIAGNOSIS — R634 Abnormal weight loss: Secondary | ICD-10-CM

## 2011-12-07 DIAGNOSIS — K5790 Diverticulosis of intestine, part unspecified, without perforation or abscess without bleeding: Secondary | ICD-10-CM

## 2011-12-07 DIAGNOSIS — K635 Polyp of colon: Secondary | ICD-10-CM

## 2011-12-07 DIAGNOSIS — K5730 Diverticulosis of large intestine without perforation or abscess without bleeding: Secondary | ICD-10-CM

## 2011-12-07 DIAGNOSIS — K559 Vascular disorder of intestine, unspecified: Secondary | ICD-10-CM

## 2011-12-07 DIAGNOSIS — K921 Melena: Secondary | ICD-10-CM

## 2011-12-07 DIAGNOSIS — Z860101 Personal history of adenomatous and serrated colon polyps: Secondary | ICD-10-CM | POA: Insufficient documentation

## 2011-12-07 DIAGNOSIS — D509 Iron deficiency anemia, unspecified: Secondary | ICD-10-CM

## 2011-12-07 DIAGNOSIS — R5381 Other malaise: Secondary | ICD-10-CM

## 2011-12-07 HISTORY — DX: Personal history of adenomatous and serrated colon polyps: Z86.0101

## 2011-12-07 HISTORY — DX: Diverticulosis of intestine, part unspecified, without perforation or abscess without bleeding: K57.90

## 2011-12-07 LAB — CBC WITH DIFFERENTIAL/PLATELET
Basophils Absolute: 0 10*3/uL (ref 0.0–0.1)
Basophils Relative: 0.4 % (ref 0.0–3.0)
Eosinophils Absolute: 0.2 10*3/uL (ref 0.0–0.7)
Lymphocytes Relative: 20.9 % (ref 12.0–46.0)
MCHC: 31.4 g/dL (ref 30.0–36.0)
MCV: 76.3 fl — ABNORMAL LOW (ref 78.0–100.0)
Monocytes Absolute: 0.8 10*3/uL (ref 0.1–1.0)
Neutro Abs: 6.2 10*3/uL (ref 1.4–7.7)
Neutrophils Relative %: 67.5 % (ref 43.0–77.0)
RDW: 19.8 % — ABNORMAL HIGH (ref 11.5–14.6)

## 2011-12-07 LAB — COMPREHENSIVE METABOLIC PANEL
AST: 23 U/L (ref 0–37)
Albumin: 4.2 g/dL (ref 3.5–5.2)
Alkaline Phosphatase: 75 U/L (ref 39–117)
BUN: 18 mg/dL (ref 6–23)
Creatinine, Ser: 1.2 mg/dL (ref 0.4–1.5)
Glucose, Bld: 97 mg/dL (ref 70–99)
Potassium: 3.9 mEq/L (ref 3.5–5.1)
Total Bilirubin: 0.9 mg/dL (ref 0.3–1.2)

## 2011-12-07 MED ORDER — IOHEXOL 300 MG/ML  SOLN
100.0000 mL | Freq: Once | INTRAMUSCULAR | Status: AC | PRN
  Administered 2011-12-07: 100 mL via INTRAVENOUS

## 2011-12-07 NOTE — Telephone Encounter (Signed)
See notes from 12/07/11 encounter.

## 2011-12-07 NOTE — Patient Instructions (Addendum)
  You have been scheduled for a CT scan of the abdomen and pelvis at [facility] ([address]---this is in the same building as [facility]).   You are scheduled on 12/07/2011 at 4:30. You should arrive 15 minutes prior to your appointment time for registration. Please follow the written instructions below on the day of your exam:  WARNING: IF YOU ARE ALLERGIC TO IODINE/X-RAY DYE, PLEASE NOTIFY RADIOLOGY IMMEDIATELY AT [phone] YOU WILL BE GIVEN A 13 HOUR PREMEDICATION PREP.   2) You have been given 2 bottles of oral contrast to drink. The solution may taste better if refrigerated, but do NOT add ice or any other liquid to this solution. Shake  well before drinking.    Drink 1 bottle of contrast @ 3:10 (2 hours prior to your exam)  Drink 1 bottle of contrast @ 3:50 (1 hour prior to your exam)  You may take any medications as prescribed with a small amount of water except for the following: Metformin, Glucophage, Glucovance, Avandamet, Riomet, Fortamet, Actoplus Met, Janumet, Glumetza or Metaglip. The above medications must be held the day of the exam AND 48 hours after the exam.  The purpose of you drinking the oral contrast is to aid in the visualization of your intestinal tract. The contrast solution may cause some diarrhea. Before your exam is started, you will be given a small amount of fluid to drink. Depending on your individual set of symptoms, you may also receive an intravenous injection of x-ray contrast/dye. Plan on being at [facility] for 30 minutes or long, depending on the type of exam you are having performed.  If you have any questions regarding your exam or if you need to reschedule, you may call the CT department at [phone] between the hours of 8:00 am and 5:00 pm, Monday-Friday.  Continue taking your Ultram as needed.   ________________________________________________________________________

## 2011-12-07 NOTE — Telephone Encounter (Signed)
Per Dr [name], called to check on pt after Dr [name] ordered Levsin and Tramadol for his pain last pm. Pt reports he's " lots better " this am and denies cramping; he has seen no blood in his stool since last night. Pt reports he is drinking and eating and the only complaint is a headache. Informed him, if he wasn't any better, Dr [name] was going to order a CT scan. Pt will see Amy Esterwood today at 2:30pm and he will have his labs drawn before this appt. Pt stated understanding.

## 2011-12-07 NOTE — Progress Notes (Addendum)
Subjective:    Patient ID: Lucas Rocha, male    DOB: 09/29/1965, 46 y.o.   MRN: [id number]  HPI Lucas Rocha is a pleasant 46 year old white male recently known to Dr. [name] , deferred for evaluation of iron deficiency anemia. Patient underwent colonoscopy and upper endoscopy on 11/28/2011 with finding of a grade B. distal esophagitis on endoscopy and sigmoid diverticulosis on colonoscopy as well as 2 small polyps which were removed, one of these was hyperplastic the other a serrated adenoma. Biopsies were also taken of benign fundic gland polyps and duodenal biopsies were done to rule out celiac disease and these were negative . Patient says he felt fine after the colonoscopy no did not have a bowel movement for several days. His wife says that his energy level has been decreased in the past week in general. He had acute onset early Wednesday morning this week 12/06/2011 with clamminess and diaphoresis which woke him up followed by severe nausea and severe abdominal pain. He says he felt so bad he thought he was going to pass out and actually lay down on the bathroom floor. He did not wind up vomiting but did have several episodes of stools which became progressively more loose and then bloody. He says he had several episodes of with dark blood and clots noted in the commode. This went on through most of yesterday. He called last evening and was started on Levsin when necessary as well as tramadol for pain. Since then he has been feeling a bit better. He has not had any further bowel movements today he is still cramping and uncomfortable but says it's much improved over yesterday. Has not had any fever or chills. He was able to eat a little bit today. He and his wife are both concerned because he has been quite fatigued over the past couple weeks, the patient is also concerned because he has lost weight. Initially he stated he had that he had lost 12 pounds but by our records it looks like 6 pounds. He says  his appetite has been good and he been eating normally.    Review of Systems  Constitutional: Positive for activity change, fatigue and unexpected weight change.  HENT: Negative.   Eyes: Negative.   Respiratory: Negative.   Cardiovascular: Negative.   Gastrointestinal: Positive for nausea, abdominal pain, diarrhea and blood in stool.  Genitourinary: Negative.   Musculoskeletal: Negative.   Neurological: Negative.   Hematological: Negative.   Psychiatric/Behavioral: Negative.    Outpatient Prescriptions Prior to Visit  Medication Sig Dispense Refill  . FeFum-FePoly-FA-B Cmp-C-Biot (INTEGRA PLUS) CAPS Take 1 capsule by mouth daily.  30 capsule  3  . fexofenadine (ALLEGRA) 180 MG tablet Take 180 mg by mouth 2 (two) times daily as needed.        . fluticasone (FLONASE) 50 MCG/ACT nasal spray Place 2 sprays into the nose daily.  16 g  6  . hyoscyamine (LEVSIN SL) 0.125 MG SL tablet Place 1 tablet (0.125 mg total) under the tongue every 4 (four) hours as needed for cramping.  30 tablet  0  . pantoprazole (PROTONIX) 40 MG tablet Take 1 tablet (40 mg total) by mouth daily.  30 tablet  1  . traMADol (ULTRAM) 50 MG tablet Take 1 tablet (50 mg total) by mouth every 6 (six) hours as needed for pain.  20 tablet  0   Allergies  Allergen Reactions  . Penicillins Hives    HIVES AND HYPERACTIVITY  . Sulfacetamide Sodium Other (  See Comments)    PT UNSURE-TOLD REACTED AS A CHILD   Patient Active Problem List  Diagnosis  . ALLERGIC RHINITIS  . G E R D  . Iron deficiency anemia  . Diverticulosis  . Colon polyps   History   Social History  . Marital Status: Married    Spouse Name: N/A    Number of Children: 2  . Years of Education: N/A   Occupational History  . Fire Engineer, mining     Social History Main Topics  . Smoking status: Never Smoker   . Smokeless tobacco: Never Used  . Alcohol Use: No  . Drug Use: No  . Sexually Active: Not on file   Other Topics Concern  . Not on file    Social History Narrative   Daily caffeine        Objective:   Physical Exam well-developed white male in no acute distress, accompanied by his wife, blood pressure 110/80 pulse 90 height 5 foot 11 weight 174 down 6 pounds over the past week. HEENT; nontraumatic normocephalic EOMI PERRLA sclera anicteric,Neck; Supple no JVD, Cardiovascular; regular rate and rhythm with S1-S2 no murmur or gallop, Pulmonary; clear bilaterally, Abdomen; soft he is tender in the left lower quadrant and mildly in the left mid quadrant there is no guarding no rebound no palpable mass or hepatosplenomegaly, bowel sounds are present, Rectal; exam not done-patient showed me pictures on his phone of dark blood in the commode., Extremities; no clubbing cyanosis or edema skin warm dry, Psych; mood and affect normal and appropriate        Assessment & Plan:  #50 46 year old male status post colonoscopy with polypectomy and upper endoscopy with small bowel biopsies and gastric biopsies on 11/28/2011, done for evaluation of iron deficiency anemia. He has an adenomatous and hyperplastic polyp, sigmoid diverticulosis, and distal esophagitis which is mild. None of these definitely explain his iron deficiency anemia. #2 acute illness over the past 36 hours with diaphoresis, nausea intense abdominal pain followed by diarrhea and then bloody diarrhea. Clinically and bi-exam this seems most consistent with a segmental ischemic colitis. His symptoms have improved over the past 12 hours. Precipitating factor is not clear at this time #3 weight loss #4 fatigue  Plan; CBC was done in the office today his WBC is 9, hemoglobin is actually up at 12.2 which may represent some hemoconcentration. Will schedule for CT scan of the abdomen and pelvis, both to confirm the diagnosis of segmental ischemic colitis and also to further evaluate the small bowel and other abdominal organs given weight loss etc. We will continue when necessary Levsin and  Ultram as needed for pain Push fluids, soft diet as tolerated Continue iron supplement with food Continue Protonix 40 mg by mouth every morning Will discuss with Dr. [name], and review CT scan, he may need capsule endoscopy  Addendum: Reviewed and agree with initial management. Beverley Fiedler, MD

## 2011-12-08 ENCOUNTER — Encounter: Payer: Self-pay | Admitting: Internal Medicine

## 2011-12-12 ENCOUNTER — Encounter: Payer: Self-pay | Admitting: Internal Medicine

## 2011-12-12 ENCOUNTER — Ambulatory Visit (INDEPENDENT_AMBULATORY_CARE_PROVIDER_SITE_OTHER): Payer: 59 | Admitting: Internal Medicine

## 2011-12-12 VITALS — BP 120/80 | Wt 176.0 lb

## 2011-12-12 DIAGNOSIS — D126 Benign neoplasm of colon, unspecified: Secondary | ICD-10-CM

## 2011-12-12 DIAGNOSIS — K317 Polyp of stomach and duodenum: Secondary | ICD-10-CM

## 2011-12-12 DIAGNOSIS — K635 Polyp of colon: Secondary | ICD-10-CM

## 2011-12-12 DIAGNOSIS — D131 Benign neoplasm of stomach: Secondary | ICD-10-CM

## 2011-12-12 HISTORY — DX: Polyp of stomach and duodenum: K31.7

## 2011-12-12 NOTE — Progress Notes (Signed)
  Subjective:    Patient ID: Lucas Rocha, male    DOB: 07-22-1965, 46 y.o.   MRN: [id number]  HPI  46 year old patient who is seen today for followup. Since his last visit here he is had a full GI workup the 2 iron deficiency anemia. He is now on iron supplements and feels much improved. Hemoglobin 5 days ago had improved to 12.0 g percent. He has had upper and lower panendoscopy which revealed both gastric and colonic polyps. There is no evidence of celiac disease. There was very mild diverticular disease. He had an abdominal and pelvic CT scan 5 days ago due to acute onset of crampy abdominal pain with diarrhea. Radiographically there appeared to be very mild wall thickening of the descending colon consistent with mild colitis. There was an incidental 3 mm nonobstructing right upper pole renal stone. He has had no recurrent issues.    Review of Systems  Constitutional: Negative for fever, chills, appetite change and fatigue.  HENT: Negative for hearing loss, ear pain, congestion, sore throat, trouble swallowing, neck stiffness, dental problem, voice change and tinnitus.   Eyes: Negative for pain, discharge and visual disturbance.  Respiratory: Negative for cough, chest tightness, wheezing and stridor.   Cardiovascular: Negative for chest pain, palpitations and leg swelling.  Gastrointestinal: Positive for abdominal pain (Resolved). Negative for nausea, vomiting, diarrhea, constipation, blood in stool and abdominal distention.  Genitourinary: Negative for urgency, hematuria, flank pain, discharge, difficulty urinating and genital sores.  Musculoskeletal: Negative for myalgias, back pain, joint swelling, arthralgias and gait problem.  Skin: Negative for rash.  Neurological: Negative for dizziness, syncope, speech difficulty, weakness, numbness and headaches.  Hematological: Negative for adenopathy. Does not bruise/bleed easily.  Psychiatric/Behavioral: Negative for behavioral problems and  dysphoric mood. The patient is not nervous/anxious.        Objective:   Physical Exam  Constitutional: He is oriented to person, place, and time. He appears well-developed.  HENT:  Head: Normocephalic.  Right Ear: External ear normal.  Left Ear: External ear normal.  Eyes: Conjunctivae and EOM are normal.  Neck: Normal range of motion.  Cardiovascular: Normal rate and normal heart sounds.   Pulmonary/Chest: Breath sounds normal.  Abdominal: Soft. Bowel sounds are normal. He exhibits no distension and no mass. There is no tenderness. There is no rebound and no guarding.  Musculoskeletal: Normal range of motion. He exhibits no edema and no tenderness.  Neurological: He is alert and oriented to person, place, and time.  Psychiatric: He has a normal mood and affect. His behavior is normal.          Assessment & Plan:   Iron deficiency anemia improving Colonic and gastric polyps  GI followup next month as scheduled. We'll followup hemoglobin at that time

## 2011-12-12 NOTE — Patient Instructions (Signed)
GI follow-up as scheduled  Call or return to clinic prn if these symptoms worsen or fail to improve as anticipated.  

## 2011-12-19 ENCOUNTER — Ambulatory Visit: Payer: 59 | Admitting: Internal Medicine

## 2012-01-11 ENCOUNTER — Encounter: Payer: Self-pay | Admitting: Internal Medicine

## 2012-01-15 ENCOUNTER — Ambulatory Visit (INDEPENDENT_AMBULATORY_CARE_PROVIDER_SITE_OTHER): Payer: 59 | Admitting: Internal Medicine

## 2012-01-15 ENCOUNTER — Other Ambulatory Visit (INDEPENDENT_AMBULATORY_CARE_PROVIDER_SITE_OTHER): Payer: 59

## 2012-01-15 ENCOUNTER — Encounter: Payer: Self-pay | Admitting: Internal Medicine

## 2012-01-15 VITALS — BP 110/66 | HR 92 | Ht 71.0 in | Wt 176.0 lb

## 2012-01-15 DIAGNOSIS — K317 Polyp of stomach and duodenum: Secondary | ICD-10-CM

## 2012-01-15 DIAGNOSIS — D509 Iron deficiency anemia, unspecified: Secondary | ICD-10-CM

## 2012-01-15 DIAGNOSIS — D126 Benign neoplasm of colon, unspecified: Secondary | ICD-10-CM

## 2012-01-15 DIAGNOSIS — D131 Benign neoplasm of stomach: Secondary | ICD-10-CM

## 2012-01-15 DIAGNOSIS — K219 Gastro-esophageal reflux disease without esophagitis: Secondary | ICD-10-CM

## 2012-01-15 DIAGNOSIS — K559 Vascular disorder of intestine, unspecified: Secondary | ICD-10-CM

## 2012-01-15 LAB — CBC WITH DIFFERENTIAL/PLATELET
Basophils Relative: 0.4 % (ref 0.0–3.0)
Hemoglobin: 14.5 g/dL (ref 13.0–17.0)
Lymphocytes Relative: 27.4 % (ref 12.0–46.0)
Monocytes Relative: 10.6 % (ref 3.0–12.0)
Neutro Abs: 3.5 10*3/uL (ref 1.4–7.7)
RBC: 5.28 Mil/uL (ref 4.22–5.81)

## 2012-01-15 LAB — IBC PANEL
Saturation Ratios: 29.5 % (ref 20.0–50.0)
Transferrin: 242 mg/dL (ref 212.0–360.0)

## 2012-01-15 MED ORDER — INTEGRA PLUS PO CAPS: 1.0000 | ORAL_CAPSULE | Freq: Every day | ORAL | Status: DC

## 2012-01-15 NOTE — Progress Notes (Signed)
Subjective:    Patient ID: Lucas Rocha, male    DOB: 1965-06-17, 46 y.o.   MRN: [id number]  HPI Mr. Senger is a 46 yo male with history of iron deficiency anemia who returns for followup. The patient underwent upper endoscopy and colonoscopy on 11/28/2011. Upper endoscopy revealed distal esophagitis, while the colonoscopy revealed 3 polyps, one of which was a serrated adenoma. He then presented on 12/08/2011 with an acute illness that was felt to be ischemic colitis. At that time he was having nausea with intense lower left-sided abdominal pain along with bloody diarrhea. CT scan was performed which did show a segment of likely ischemic colitis in the left colon. He was managed supportively and returns for followup. He reports that he is doing well. His bowel habits have returned to normal and he is feeling well. He reports significantly improved energy level, and he has been able to travel and do what he wishes. His bowel habits have normalized, and he is having no blood or melena. Over last 3 or 4 days he has noticed a dull ache in his epigastrium. This is usually after eating, And can be associated with belching and upper gas. He feels that this may be due to the fact that his Integra plus was changed to Integra. He denies nausea and vomiting. No dysphagia. He does continue pantoprazole.  Review of Systems As per HPI, otherwise unremarkable  Current Medications, Allergies, Past Medical History, Past Surgical History, Family History and Social History were reviewed in [employer] record.     Objective:   Physical Exam BP 110/66  Pulse 92  Ht 5\' 11"  (1.803 m)  Wt 176 lb (79.833 kg)  BMI 24.55 kg/m2 Constitutional: Well-developed and well-nourished. No distress. HEENT: Normocephalic and atraumatic. Oropharynx is clear and moist. No oropharyngeal exudate. Conjunctivae are normal. No scleral icterus. Neck: Neck supple. Trachea midline. Cardiovascular: Normal rate,  regular rhythm and intact distal pulses. No M/R/G Pulmonary/chest: Effort normal and breath sounds normal. No wheezing, rales or rhonchi. Abdominal: Soft, nontender, nondistended. Bowel sounds active throughout. There are no masses palpable. No hepatosplenomegaly. Extremities: no clubbing, cyanosis, or edema Lymphadenopathy: No cervical adenopathy noted. Neurological: Alert and oriented to person place and time. Skin: Skin is warm and dry. No rashes noted. Psychiatric: Normal mood and affect. Behavior is normal.     Assessment & Plan:  46 year old male with history of iron deficiency anemia, one episode of ischemic colitis now improved, and history of adenomatous colon polyps  1. IDA -- he did have mild reflux esophagitis, and he has been maintained on pantoprazole. His epigastric pain at present may be due to the fact that he is taking his Protonix with meals, rather than before. I've asked that he take this 30 minutes to one hour before his first meal of the day. It is also possible that he tolerates Integra plus, rather than Integra. We will ask the pharmacy to supply Integra plus from now on. I would like for him to continue the iron supplementation for another one to 2 months. We will check CBC and iron studies today. If his iron studies failed to normalize, then we will proceed with VCE.  2. Ischemic colitis -- this has resolved, and the etiology is unclear. There were no other concerning findings on his CT scan performed in mid July. At this point no further workup is felt necessary. We discussed how this is unlikely after colonoscopy, but given the relation to these procedures, I cannot  exclude that it was related.  3. History of adenomatous colon polyp -- the patient did have one sessile serrated adenoma, and I have recommended repeat colonoscopy in 5 years. He understands this recommendation  I will see him back in 3 months time.

## 2012-01-15 NOTE — Patient Instructions (Addendum)
Your physician has requested that you go to the basement for the following lab work before leaving today: CBC, Ferratin, TIBC  Dr. [name] recommends you take protonix 30 min. Prior to your first meal of the day.  We have sent the following medications to your pharmacy for you to pick up at your convenience: Integra plus  Follow up with Dr. [name] in 3 months

## 2012-01-22 ENCOUNTER — Other Ambulatory Visit: Payer: Self-pay | Admitting: *Deleted

## 2012-01-22 DIAGNOSIS — D649 Anemia, unspecified: Secondary | ICD-10-CM

## 2012-02-28 ENCOUNTER — Ambulatory Visit (INDEPENDENT_AMBULATORY_CARE_PROVIDER_SITE_OTHER): Payer: 59 | Admitting: Internal Medicine

## 2012-02-28 ENCOUNTER — Encounter: Payer: Self-pay | Admitting: Internal Medicine

## 2012-02-28 VITALS — BP 144/80 | HR 95 | Temp 97.9°F | Wt 181.0 lb

## 2012-02-28 DIAGNOSIS — J019 Acute sinusitis, unspecified: Secondary | ICD-10-CM

## 2012-02-28 MED ORDER — CEFUROXIME AXETIL 500 MG PO TABS: 500.0000 mg | ORAL_TABLET | Freq: Two times a day (BID) | ORAL | Status: AC

## 2012-02-28 NOTE — Progress Notes (Signed)
  Subjective:    Patient ID: Lucas Rocha, male    DOB: 04/06/1966, 46 y.o.   MRN: [id number]  HPI  46 year old white male complains of sinus congestion and pressure for the last 2 weeks. Patient has tried multiple over-the-counter remedies including netty pot without any improvement. Patient reports purulent nasal drainage. He denies fever or chills. He notes phlegm can be blood-tinged. He has pressure in both maxillary sinuses and frontal sinus area.  We reviewed his allergy to penicillin. This was discovered as a child. He was told that he had hives. He denies any history of anaphylaxis with penicillin.  Review of Systems  See HPI  Past Medical History  Diagnosis Date  . ALLERGIC RHINITIS 01/01/2007  . G E R D 12/28/2006  . Anemia     History   Social History  . Marital Status: Married    Spouse Name: N/A    Number of Children: 2  . Years of Education: N/A   Occupational History  . Fire Engineer, mining     Social History Main Topics  . Smoking status: Never Smoker   . Smokeless tobacco: Never Used  . Alcohol Use: No  . Drug Use: No  . Sexually Active: Not on file   Other Topics Concern  . Not on file   Social History Narrative   Daily caffeine     Past Surgical History  Procedure Date  . Ganglion cyst excision     foot    Family History  Problem Relation Age of Onset  . Colon cancer Neg Hx   . Diabetes Maternal Grandfather   . Heart disease      Dad's side     Allergies  Allergen Reactions  . Penicillins Hives    HIVES AND HYPERACTIVITY  . Sulfacetamide Sodium Other (See Comments)    PT UNSURE-TOLD REACTED AS A CHILD    Current Outpatient Prescriptions on File Prior to Visit  Medication Sig Dispense Refill  . FeFum-FePoly-FA-B Cmp-C-Biot (INTEGRA PLUS) CAPS Take 1 capsule by mouth daily.  30 capsule  3  . fexofenadine (ALLEGRA) 180 MG tablet Take 180 mg by mouth 2 (two) times daily as needed.        . fluticasone (FLONASE) 50 MCG/ACT nasal spray Place  2 sprays into the nose daily.  16 g  6  . hyoscyamine (LEVSIN SL) 0.125 MG SL tablet Place 1 tablet (0.125 mg total) under the tongue every 4 (four) hours as needed for cramping.  30 tablet  0  . omeprazole (PRILOSEC) 20 MG capsule       . pantoprazole (PROTONIX) 40 MG tablet Take 1 tablet (40 mg total) by mouth daily.  30 tablet  1    BP 144/80  Pulse 95  Temp 97.9 F (36.6 C) (Oral)  Wt 181 lb (82.101 kg)  SpO2 98%     Objective:   Physical Exam  Constitutional: He appears well-developed and well-nourished.  HENT:  Head: Normocephalic and atraumatic.  Right Ear: External ear normal.  Left Ear: External ear normal.       Signs of postnasal drip Boggy and swollen nasal turbinates bilaterally  Neck: Neck supple.  Cardiovascular: Normal rate, regular rhythm and normal heart sounds.   Pulmonary/Chest: Effort normal and breath sounds normal. He has no wheezes. He has no rales.  Lymphadenopathy:    He has no cervical adenopathy.          Assessment & Plan:

## 2012-02-28 NOTE — Patient Instructions (Addendum)
Use intranasal saline 3-4 times daily Take antibiotics as directed Please call our office if your symptoms do not improve or gets worse.

## 2012-02-28 NOTE — Assessment & Plan Note (Signed)
46 year old white male with signs symptoms of acute sinusitis. Treat with cefuroxime 500 mg twice daily for 10 days. Patient also advised to use intranasal saline spray 3-4 times daily.  Patient advised to call office if symptoms persist or worsen.

## 2012-04-19 ENCOUNTER — Telehealth: Payer: Self-pay | Admitting: *Deleted

## 2012-04-19 NOTE — Telephone Encounter (Signed)
Message copied by Florene Glen on Fri Apr 19, 2012  1:07 PM ------      Message from: Daphine Deutscher      Created: Mon Jan 22, 2012  3:47 PM       Due for 3 month labs for [facility]. (cbc, iron study, ferritin, TIBC)

## 2012-04-19 NOTE — Telephone Encounter (Signed)
Informed pt he needs labs repeated to ensure his anemia has resolved. Pt stated understanding and reports he will need to send the doc for the [place] a letter stating the problem has been resolved. Pt will bring the doc's name when he comes for labs.

## 2012-04-19 NOTE — Telephone Encounter (Signed)
lmom for pt to call back. Pt needs repeat labs to ensure his anemia has resolved.

## 2012-06-06 ENCOUNTER — Telehealth: Payer: Self-pay | Admitting: *Deleted

## 2012-06-06 ENCOUNTER — Other Ambulatory Visit (INDEPENDENT_AMBULATORY_CARE_PROVIDER_SITE_OTHER): Payer: 59

## 2012-06-06 DIAGNOSIS — D649 Anemia, unspecified: Secondary | ICD-10-CM

## 2012-06-06 LAB — CBC WITH DIFFERENTIAL/PLATELET
Basophils Absolute: 0 10*3/uL (ref 0.0–0.1)
Basophils Relative: 0.3 % (ref 0.0–3.0)
Hemoglobin: 14.8 g/dL (ref 13.0–17.0)
Lymphocytes Relative: 29 % (ref 12.0–46.0)
Monocytes Relative: 8.9 % (ref 3.0–12.0)
Neutro Abs: 3.9 10*3/uL (ref 1.4–7.7)
RBC: 4.86 Mil/uL (ref 4.22–5.81)

## 2012-06-06 LAB — IBC PANEL
Iron: 122 ug/dL (ref 42–165)
Saturation Ratios: 34 % (ref 20.0–50.0)
Transferrin: 256 mg/dL (ref 212.0–360.0)

## 2012-06-06 LAB — FERRITIN: Ferritin: 49.2 ng/mL (ref 22.0–322.0)

## 2012-06-06 NOTE — Telephone Encounter (Signed)
Informed pt of his results and he requested we send a letter with labs to the company doctor, Dr Bethena Midget, [address], [place] [address]; address was supplied by the pt.

## 2012-06-06 NOTE — Telephone Encounter (Signed)
Message copied by Florene Glen on Thu Jun 06, 2012  1:54 PM ------      Message from: Beverley Fiedler      Created: Thu Jun 06, 2012 10:58 AM       Anemia has resolved.      Iron studies are normal      He can stop oral iron, multivitamin daily is recommended.      OV PRN

## 2012-12-04 ENCOUNTER — Telehealth: Payer: Self-pay | Admitting: Internal Medicine

## 2012-12-04 MED ORDER — OMEPRAZOLE 20 MG PO CPDR: 20.0000 mg | DELAYED_RELEASE_CAPSULE | Freq: Every day | ORAL | Status: DC

## 2012-12-04 NOTE — Telephone Encounter (Signed)
Pt need refill omeprazole 20 mg #90 with 3 refill sent to [facility].

## 2012-12-04 NOTE — Telephone Encounter (Signed)
Spoke to pt told him will send Rx for Omeprazole to [facility] but needs to make an appt for his CPE. Pt verbalized understanding.

## 2013-02-07 ENCOUNTER — Emergency Department (HOSPITAL_BASED_OUTPATIENT_CLINIC_OR_DEPARTMENT_OTHER): Payer: 59

## 2013-02-07 ENCOUNTER — Encounter (HOSPITAL_BASED_OUTPATIENT_CLINIC_OR_DEPARTMENT_OTHER): Payer: Self-pay | Admitting: *Deleted

## 2013-02-07 ENCOUNTER — Emergency Department (HOSPITAL_BASED_OUTPATIENT_CLINIC_OR_DEPARTMENT_OTHER)
Admission: EM | Admit: 2013-02-07 | Discharge: 2013-02-07 | Disposition: A | Discharge status: Home / Self Care | Payer: 59 | Attending: Emergency Medicine | Admitting: Emergency Medicine

## 2013-02-07 DIAGNOSIS — Z880 Allergy status to penicillin: Secondary | ICD-10-CM | POA: Insufficient documentation

## 2013-02-07 DIAGNOSIS — N201 Calculus of ureter: Secondary | ICD-10-CM | POA: Diagnosis present

## 2013-02-07 DIAGNOSIS — Z79899 Other long term (current) drug therapy: Secondary | ICD-10-CM | POA: Insufficient documentation

## 2013-02-07 DIAGNOSIS — IMO0002 Reserved for concepts with insufficient information to code with codable children: Secondary | ICD-10-CM | POA: Insufficient documentation

## 2013-02-07 DIAGNOSIS — K219 Gastro-esophageal reflux disease without esophagitis: Secondary | ICD-10-CM | POA: Insufficient documentation

## 2013-02-07 DIAGNOSIS — D649 Anemia, unspecified: Secondary | ICD-10-CM | POA: Insufficient documentation

## 2013-02-07 HISTORY — DX: Calculus of ureter: N20.1

## 2013-02-07 LAB — URINALYSIS W MICROSCOPIC + REFLEX CULTURE
Glucose, UA: NEGATIVE mg/dL
Ketones, ur: 15 mg/dL — AB
Protein, ur: 100 mg/dL — AB
Urobilinogen, UA: 1 mg/dL (ref 0.0–1.0)

## 2013-02-07 LAB — BASIC METABOLIC PANEL
CO2: 31 mEq/L (ref 19–32)
Calcium: 10.2 mg/dL (ref 8.4–10.5)
Creatinine, Ser: 1.2 mg/dL (ref 0.50–1.35)
GFR calc Af Amer: 83 mL/min — ABNORMAL LOW (ref >90)
GFR calc non Af Amer: 72 mL/min — ABNORMAL LOW (ref >90)

## 2013-02-07 MED ORDER — CEPHALEXIN 500 MG PO CAPS: 1000.0000 mg | ORAL_CAPSULE | Freq: Two times a day (BID) | ORAL | Status: DC

## 2013-02-07 MED ORDER — KETOROLAC TROMETHAMINE 30 MG/ML IJ SOLN
30.0000 mg | Freq: Once | INTRAMUSCULAR | Status: AC
  Administered 2013-02-07: 30 mg via INTRAVENOUS
  Filled 2013-02-07: qty 1

## 2013-02-07 MED ORDER — TAMSULOSIN HCL 0.4 MG PO CAPS: 0.4000 mg | ORAL_CAPSULE | Freq: Every day | ORAL | Status: DC

## 2013-02-07 MED ORDER — OXYCODONE-ACETAMINOPHEN 5-325 MG PO TABS: 1.0000 | ORAL_TABLET | ORAL | Status: DC | PRN

## 2013-02-07 MED ORDER — SODIUM CHLORIDE 0.9 % IV BOLUS (SEPSIS)
1000.0000 mL | INTRAVENOUS | Status: AC
  Administered 2013-02-07: 1000 mL via INTRAVENOUS

## 2013-02-07 MED ORDER — OXYCODONE-ACETAMINOPHEN 5-325 MG PO TABS
2.0000 | ORAL_TABLET | Freq: Once | ORAL | Status: AC
  Administered 2013-02-07: 2 via ORAL
  Filled 2013-02-07 (×2): qty 2

## 2013-02-07 MED ORDER — DEXTROSE 5 % IV SOLN
1.0000 g | Freq: Once | INTRAVENOUS | Status: AC
  Administered 2013-02-07: 1 g via INTRAVENOUS
  Filled 2013-02-07: qty 10

## 2013-02-07 MED ORDER — HYDROMORPHONE HCL PF 1 MG/ML IJ SOLN
0.5000 mg | Freq: Once | INTRAMUSCULAR | Status: AC
  Administered 2013-02-07: 0.5 mg via INTRAVENOUS
  Filled 2013-02-07: qty 1

## 2013-02-07 MED ORDER — ONDANSETRON HCL 4 MG/2ML IJ SOLN
4.0000 mg | Freq: Once | INTRAMUSCULAR | Status: AC
  Administered 2013-02-07: 4 mg via INTRAVENOUS
  Filled 2013-02-07: qty 2

## 2013-02-07 NOTE — ED Notes (Signed)
Pt vomiting  At present Iv infusing

## 2013-02-07 NOTE — ED Notes (Signed)
Right flank pain since 0430- vomited x 1

## 2013-02-07 NOTE — ED Provider Notes (Signed)
CSN: [id number]     Arrival date & time 02/07/13  4132 History   First MD Initiated Contact with Patient 02/07/13 0700     Chief Complaint  Patient presents with  . Flank Pain   (Consider location/radiation/quality/duration/timing/severity/associated sxs/prior Treatment) Patient is a 47 y.o. male presenting with flank pain. The history is provided by the patient.  Flank Pain This is a new problem. The current episode started 3 to 5 hours ago. The problem occurs constantly. The problem has not changed since onset.Pertinent negatives include no chest pain, no abdominal pain, no headaches and no shortness of breath. Nothing aggravates the symptoms. Nothing relieves the symptoms. He has tried nothing for the symptoms. The treatment provided no relief.    Past Medical History  Diagnosis Date  . ALLERGIC RHINITIS 01/01/2007  . G E R D 12/28/2006  . Anemia    Past Surgical History  Procedure Laterality Date  . Ganglion cyst excision      foot   Family History  Problem Relation Age of Onset  . Colon cancer Neg Hx   . Diabetes Maternal Grandfather   . Heart disease      Dad's side    History  Substance Use Topics  . Smoking status: Never Smoker   . Smokeless tobacco: Never Used  . Alcohol Use: No    Review of Systems  Constitutional: Negative for fever.  HENT: Negative for rhinorrhea, drooling and neck pain.   Eyes: Negative for pain.  Respiratory: Negative for cough and shortness of breath.   Cardiovascular: Negative for chest pain and leg swelling.  Gastrointestinal: Positive for nausea and vomiting. Negative for abdominal pain and diarrhea.  Genitourinary: Positive for flank pain. Negative for dysuria and hematuria.  Musculoskeletal: Negative for gait problem.  Skin: Negative for color change.  Neurological: Negative for numbness and headaches.  Hematological: Negative for adenopathy.  Psychiatric/Behavioral: Negative for behavioral problems.  All other systems reviewed and  are negative.    Allergies  Penicillins and Sulfacetamide sodium  Home Medications   Current Outpatient Rx  Name  Route  Sig  Dispense  Refill  . omeprazole (PRILOSEC) 20 MG capsule   Oral   Take 1 capsule (20 mg total) by mouth daily.   90 capsule   0   . FeFum-FePoly-FA-B Cmp-C-Biot (INTEGRA PLUS) CAPS   Oral   Take 1 capsule by mouth daily.   30 capsule   3     Please Dispense Integra plus   . fexofenadine (ALLEGRA) 180 MG tablet   Oral   Take 180 mg by mouth 2 (two) times daily as needed.           . fluticasone (FLONASE) 50 MCG/ACT nasal spray   Nasal   Place 2 sprays into the nose daily.   16 g   6   . EXPIRED: hyoscyamine (LEVSIN SL) 0.125 MG SL tablet   Sublingual   Place 1 tablet (0.125 mg total) under the tongue every 4 (four) hours as needed for cramping.   30 tablet   0   . EXPIRED: pantoprazole (PROTONIX) 40 MG tablet   Oral   Take 1 tablet (40 mg total) by mouth daily.   30 tablet   1    BP 129/78  Pulse 62  Temp(Src) 97.9 F (36.6 C) (Oral)  Resp 20  Ht 6' (1.829 m)  Wt 175 lb (79.379 kg)  BMI 23.73 kg/m2  SpO2 100% Physical Exam  Nursing note and vitals reviewed.  Constitutional: He is oriented to person, place, and time. He appears well-developed and well-nourished.  HENT:  Head: Normocephalic and atraumatic.  Right Ear: External ear normal.  Left Ear: External ear normal.  Nose: Nose normal.  Mouth/Throat: Oropharynx is clear and moist. No oropharyngeal exudate.  Eyes: Conjunctivae and EOM are normal. Pupils are equal, round, and reactive to light.  Neck: Normal range of motion. Neck supple.  Cardiovascular: Normal rate, regular rhythm, normal heart sounds and intact distal pulses.  Exam reveals no gallop and no friction rub.   No murmur heard. Pulmonary/Chest: Effort normal and breath sounds normal. No respiratory distress. He has no wheezes.  Abdominal: Soft. Bowel sounds are normal. He exhibits no distension. There is  tenderness (Mild right flank ttp). There is no rebound and no guarding.  Musculoskeletal: Normal range of motion. He exhibits no edema and no tenderness.  Mild right CVA ttp.   Neurological: He is alert and oriented to person, place, and time.  Skin: Skin is warm and dry.  Psychiatric: He has a normal mood and affect. His behavior is normal.    ED Course  Procedures (including critical care time) Labs Review Labs Reviewed  BASIC METABOLIC PANEL - Abnormal; Notable for the following:    Glucose, Bld 128 (*)    GFR calc non Af Amer 72 (*)    GFR calc Af Amer 83 (*)    All other components within normal limits  URINALYSIS W MICROSCOPIC + REFLEX CULTURE - Abnormal; Notable for the following:    Color, Urine RED (*)    APPearance CLOUDY (*)    Hgb urine dipstick LARGE (*)    Bilirubin Urine SMALL (*)    Ketones, ur 15 (*)    Protein, ur 100 (*)    Leukocytes, UA SMALL (*)    Bacteria, UA MANY (*)    Squamous Epithelial / LPF FEW (*)    All other components within normal limits  URINE CULTURE   Imaging Review Ct Abdomen Pelvis Wo Contrast  02/07/2013   *RADIOLOGY REPORT*  Clinical Data: Right flank pain  CT ABDOMEN AND PELVIS WITHOUT CONTRAST  Technique:  Multidetector CT imaging of the abdomen and pelvis was performed following the standard protocol without intravenous contrast.  Comparison: CT 12/07/2011  Findings: Obstruction of the right kidney by a right ureteral calculus.  This stone measures 4 x 5 mm at the  iliac vessel crossing.  This stone previously was in the right upper pole.  No other renal calculi identified.  No renal mass.  Left kidney is not obstructed.  Lung bases are clear.  Liver gallbladder and bile ducts are normal. Splenic granulomata are noted.  The pancreas is normal.  No bowel obstruction or bowel thickening.  Appendix is normal.  Negative for mass or adenopathy  IMPRESSION: 4 x 5 mm obstructing stone in the right ureter at the level iliac crossing.   Original  Report Authenticated By: Janeece Riggers, M.D.    MDM   1. Ureterolithiasis    7:07 AM 47 y.o. male who presents with sudden onset right flank pain which began approximately 4 AM this morning. Patient has nausea and one episode of vomiting. He denies any fever. The patient is afebrile and vital signs are unremarkable here. He is right flank pain and right CVA tenderness on exam. No history of kidney stones, but I suspect this is the source of his pain. Will get pain control, labs, CT.  11:24 AM: Questionable infection of Urine  w/ copious RBC's. Will give rocephin here and cover w/ keflex.   I have discussed the diagnosis/risks/treatment options with the patient and believe the pt to be eligible for discharge home to follow-up with urology next week as needed. We also discussed returning to the ED immediately if new or worsening sx occur. We discussed the sx which are most concerning (e.g., worsening pain, fever, inability to tolerate po) that necessitate immediate return. Any new prescriptions provided to the patient are listed below.  Discharge Medication List as of 02/07/2013 11:32 AM    START taking these medications   Details  cephALEXin (KEFLEX) 500 MG capsule Take 2 capsules (1,000 mg total) by mouth 2 (two) times daily., Starting 02/07/2013, Until Discontinued, Print    oxyCODONE-acetaminophen (PERCOCET) 5-325 MG per tablet Take 1 tablet by mouth every 4 (four) hours as needed for pain., Starting 02/07/2013, Until Discontinued, Print    tamsulosin (FLOMAX) 0.4 MG CAPS capsule Take 1 capsule (0.4 mg total) by mouth daily., Starting 02/07/2013, Until Discontinued, Print         Junius Argyle, MD 02/08/13 1346

## 2013-02-08 LAB — URINE CULTURE

## 2013-02-10 ENCOUNTER — Other Ambulatory Visit: Payer: Self-pay | Admitting: Urology

## 2013-02-11 ENCOUNTER — Encounter (HOSPITAL_COMMUNITY): Payer: Self-pay

## 2013-02-11 ENCOUNTER — Encounter (HOSPITAL_COMMUNITY): Payer: Self-pay | Admitting: Pharmacy Technician

## 2013-02-13 ENCOUNTER — Ambulatory Visit (HOSPITAL_COMMUNITY)
Admission: RE | Admit: 2013-02-13 | Discharge: 2013-02-13 | Disposition: A | Discharge status: Home / Self Care | Payer: 59 | Source: Ambulatory Visit | Attending: Urology | Admitting: Urology

## 2013-02-13 ENCOUNTER — Encounter (HOSPITAL_COMMUNITY)
Admission: RE | Disposition: A | Discharge status: Home / Self Care | Payer: Self-pay | Source: Ambulatory Visit | Attending: Urology

## 2013-02-13 ENCOUNTER — Encounter (HOSPITAL_COMMUNITY): Payer: Self-pay | Admitting: *Deleted

## 2013-02-13 DIAGNOSIS — N201 Calculus of ureter: Secondary | ICD-10-CM | POA: Insufficient documentation

## 2013-02-13 HISTORY — PX: EXTRACORPOREAL SHOCK WAVE LITHOTRIPSY: SHX1557

## 2013-02-13 SURGERY — LITHOTRIPSY, ESWL
Anesthesia: LOCAL | Laterality: Right

## 2013-02-13 MED ORDER — DIAZEPAM 5 MG PO TABS
10.0000 mg | ORAL_TABLET | ORAL | Status: AC
  Administered 2013-02-13: 10 mg via ORAL
  Filled 2013-02-13: qty 2

## 2013-02-13 MED ORDER — SODIUM CHLORIDE 0.9 % IV SOLN
INTRAVENOUS | Status: DC
  Administered 2013-02-13: 12:00:00 via INTRAVENOUS

## 2013-02-13 MED ORDER — DIPHENHYDRAMINE HCL 25 MG PO CAPS
25.0000 mg | ORAL_CAPSULE | ORAL | Status: AC
  Administered 2013-02-13: 25 mg via ORAL
  Filled 2013-02-13: qty 1

## 2013-02-13 MED ORDER — GENTAMICIN SULFATE 40 MG/ML IJ SOLN
400.0000 mg | INTRAVENOUS | Status: AC
  Administered 2013-02-13: 400 mg via INTRAVENOUS
  Filled 2013-02-13: qty 10

## 2013-02-13 MED ORDER — GENTAMICIN SULFATE 40 MG/ML IJ SOLN
400.0000 mg | INTRAVENOUS | Status: DC
  Filled 2013-02-13: qty 10

## 2013-02-13 NOTE — H&P (Signed)
Lucas Rocha is an 47 y.o. male.    Chief Complaint: Pre-OP Rt Shockwave Lithotripsy  HPI:   1 - Rt Ureteral Stone - Pt wtih 5mm Rt mid ureteral stone near SI joints / iliac crossing by ER CT 9/12 on w/u right flank pain. 400 HU, SSD 9.5cm. NO additional stones. Cr 1.2. UCX from ER with low-growth strep viridins (?contaminant).   Placed on medical therapy wtih tamsulsoin + percocet and reports no interval passage. Stone visible in similar locatyion by KUB today.  2 - Lower Urinary Tract Symptoms - pt with slowly progresive bother from weak stream, hesitancy, as well as nocturia x 2-3. DRE 01/2013 30gm.  PMH sig for vasectomy, foot surgery. No CV disease or blood thinners.  Today Lucas Rocha is seen to proceed with rt shockwave lithotripsy for his symptomatic ureteral stone. No interval fevers. No interval passage. Recent UCX negative.   Past Medical History  Diagnosis Date  . ALLERGIC RHINITIS 01/01/2007  . G E R D 12/28/2006  . Anemia   . Kidney stone 01/2013    rt ureteral stone    Past Surgical History  Procedure Laterality Date  . Ganglion cyst excision      foot    Family History  Problem Relation Age of Onset  . Colon cancer Neg Hx   . Diabetes Maternal Grandfather   . Heart disease      Dad's side    Social History:  reports that he has never smoked. He has never used smokeless tobacco. He reports that he does not drink alcohol or use illicit drugs.  Allergies:  Allergies  Allergen Reactions  . Penicillins Hives    HIVES AND HYPERACTIVITY  . Sulfacetamide Sodium Other (See Comments)    PT UNSURE-TOLD REACTED AS A CHILD    No prescriptions prior to admission    No results found for this or any previous visit (from the past 48 hour(s)). No results found.  Review of Systems  Constitutional: Negative.  Negative for fever and chills.  HENT: Negative.   Eyes: Negative.   Respiratory: Negative.   Cardiovascular: Negative.   Gastrointestinal: Negative for nausea.   Genitourinary: Positive for flank pain.  Skin: Negative.   Neurological: Negative.   Endo/Heme/Allergies: Negative.   Psychiatric/Behavioral: Negative.     There were no vitals taken for this visit. Physical Exam  Constitutional: He is oriented to person, place, and time. He appears well-developed and well-nourished.  HENT:  Head: Normocephalic and atraumatic.  Eyes: EOM are normal. Pupils are equal, round, and reactive to light.  Neck: Normal range of motion. Neck supple.  Cardiovascular: Normal rate and regular rhythm.   Respiratory: Effort normal.  GI: Soft. Bowel sounds are normal.  Genitourinary: Penis normal.  Mild Rt CVAT  Musculoskeletal: Normal range of motion.  Neurological: He is alert and oriented to person, place, and time.  Skin: Skin is warm and dry.  Psychiatric: He has a normal mood and affect. His behavior is normal. Judgment and thought content normal.     Assessment/Plan    1 - Rt Ureteral Stone - We rediscussed shockwave lithotripsy in detail as well as my "rule of 9s" with stones <52mm, less than 900 HU, and skin to stone distance <9cm having approximately 90% treatment success with single session of treatment. We then readdressed how stones that are larger, more dense, and in patients with less favorable anatomy have incrementally decreased success rates. We rediscussed risks including, bleeding, infection, hematoma, loss of kidney,  need for staged therapy, need for adjunctive therapy and requirement to refrain from any anticoagulants, anti-platelet or aspirin-like products peri-procedureally. After careful consideration, the patient has chosen to proceed today as planned.   2 - Lower Urinary Tract Symptoms -continue trial of tamsulosin.   Jalaina Salyers 02/13/2013, 6:00 AM

## 2013-02-24 ENCOUNTER — Other Ambulatory Visit: Payer: Self-pay | Admitting: Internal Medicine

## 2013-04-17 ENCOUNTER — Other Ambulatory Visit: Payer: Self-pay | Admitting: Urology

## 2013-04-18 ENCOUNTER — Telehealth: Payer: Self-pay | Admitting: Internal Medicine

## 2013-04-18 NOTE — Telephone Encounter (Signed)
Pt needs cpx before end of year.  Can I use 2 slots anywhere, anyday for this patient? Please advise.

## 2013-04-18 NOTE — Telephone Encounter (Signed)
Yes, that is fine. 

## 2013-05-02 NOTE — Telephone Encounter (Signed)
lmom on machine to call and schedule annual cpx/kar 12.5.14

## 2013-05-02 NOTE — Telephone Encounter (Signed)
Pt scheduled 05/26/13 @ 815am

## 2013-05-12 ENCOUNTER — Encounter (HOSPITAL_BASED_OUTPATIENT_CLINIC_OR_DEPARTMENT_OTHER): Payer: Self-pay | Admitting: *Deleted

## 2013-05-12 NOTE — Progress Notes (Signed)
NPO AFTER MN. ARRIVE AT 0700. NEEDS HG. WILL TAKE PRILOSEC AM DOS W/ SIPS OF WATER AND IF NEEDED OXYCODONE.

## 2013-05-13 ENCOUNTER — Encounter (HOSPITAL_BASED_OUTPATIENT_CLINIC_OR_DEPARTMENT_OTHER): Payer: Self-pay | Admitting: Anesthesiology

## 2013-05-13 NOTE — Anesthesia Preprocedure Evaluation (Signed)
Anesthesia Evaluation  Patient identified by MRN, date of birth, ID band Patient awake    Reviewed: Allergy & Precautions, H&P , NPO status , Patient's Chart, lab work & pertinent test results  Airway Mallampati: II TM Distance: >3 FB Neck ROM: Full    Dental no notable dental hx.    Pulmonary neg pulmonary ROS,  breath sounds clear to auscultation  Pulmonary exam normal       Cardiovascular negative cardio ROS  Rhythm:Regular Rate:Normal     Neuro/Psych negative neurological ROS  negative psych ROS   GI/Hepatic Neg liver ROS, GERD-  Medicated,  Endo/Other  negative endocrine ROS  Renal/GU negative Renal ROS  negative genitourinary   Musculoskeletal negative musculoskeletal ROS (+)   Abdominal   Peds negative pediatric ROS (+)  Hematology  (+) anemia ,   Anesthesia Other Findings   Reproductive/Obstetrics negative OB ROS                           Anesthesia Physical Anesthesia Plan  ASA: II  Anesthesia Plan: General   Post-op Pain Management:    Induction: Intravenous  Airway Management Planned: LMA  Additional Equipment:   Intra-op Plan:   Post-operative Plan: Extubation in OR  Informed Consent: I have reviewed the patients History and Physical, chart, labs and discussed the procedure including the risks, benefits and alternatives for the proposed anesthesia with the patient or authorized representative who has indicated his/her understanding and acceptance.   Dental advisory given  Plan Discussed with: CRNA  Anesthesia Plan Comments:         Anesthesia Quick Evaluation

## 2013-05-14 ENCOUNTER — Ambulatory Visit (HOSPITAL_BASED_OUTPATIENT_CLINIC_OR_DEPARTMENT_OTHER)
Admission: RE | Admit: 2013-05-14 | Discharge: 2013-05-14 | Disposition: A | Discharge status: Home / Self Care | Payer: 59 | Source: Ambulatory Visit | Attending: Urology | Admitting: Urology

## 2013-05-14 ENCOUNTER — Ambulatory Visit (HOSPITAL_BASED_OUTPATIENT_CLINIC_OR_DEPARTMENT_OTHER): Payer: 59 | Admitting: Anesthesiology

## 2013-05-14 ENCOUNTER — Encounter (HOSPITAL_BASED_OUTPATIENT_CLINIC_OR_DEPARTMENT_OTHER): Payer: Self-pay | Admitting: *Deleted

## 2013-05-14 ENCOUNTER — Encounter (HOSPITAL_BASED_OUTPATIENT_CLINIC_OR_DEPARTMENT_OTHER): Payer: 59 | Admitting: Anesthesiology

## 2013-05-14 ENCOUNTER — Encounter (HOSPITAL_BASED_OUTPATIENT_CLINIC_OR_DEPARTMENT_OTHER)
Admission: RE | Disposition: A | Discharge status: Home / Self Care | Payer: Self-pay | Source: Ambulatory Visit | Attending: Urology

## 2013-05-14 DIAGNOSIS — N401 Enlarged prostate with lower urinary tract symptoms: Secondary | ICD-10-CM | POA: Insufficient documentation

## 2013-05-14 DIAGNOSIS — N201 Calculus of ureter: Secondary | ICD-10-CM | POA: Insufficient documentation

## 2013-05-14 DIAGNOSIS — N138 Other obstructive and reflux uropathy: Secondary | ICD-10-CM | POA: Insufficient documentation

## 2013-05-14 DIAGNOSIS — Z9852 Vasectomy status: Secondary | ICD-10-CM | POA: Insufficient documentation

## 2013-05-14 DIAGNOSIS — K219 Gastro-esophageal reflux disease without esophagitis: Secondary | ICD-10-CM | POA: Insufficient documentation

## 2013-05-14 HISTORY — DX: Allergic rhinitis, unspecified: J30.9

## 2013-05-14 HISTORY — PX: HOLMIUM LASER APPLICATION: SHX5852

## 2013-05-14 HISTORY — DX: Benign prostatic hyperplasia without lower urinary tract symptoms: N40.0

## 2013-05-14 HISTORY — DX: Unspecified symptoms and signs involving the genitourinary system: R39.9

## 2013-05-14 HISTORY — DX: Gastro-esophageal reflux disease without esophagitis: K21.9

## 2013-05-14 HISTORY — DX: Calculus of ureter: N20.1

## 2013-05-14 HISTORY — PX: CYSTOSCOPY WITH RETROGRADE PYELOGRAM, URETEROSCOPY AND STENT PLACEMENT: SHX5789

## 2013-05-14 HISTORY — DX: Personal history of diseases of the blood and blood-forming organs and certain disorders involving the immune mechanism: Z86.2

## 2013-05-14 LAB — POCT I-STAT, CHEM 8
BUN: 17 mg/dL (ref 6–23)
Chloride: 103 mEq/L (ref 96–112)
Creatinine, Ser: 1.1 mg/dL (ref 0.50–1.35)
Glucose, Bld: 108 mg/dL — ABNORMAL HIGH (ref 70–99)
HCT: 45 % (ref 39.0–52.0)
Hemoglobin: 15.3 g/dL (ref 13.0–17.0)
Potassium: 4 mEq/L (ref 3.5–5.1)
Sodium: 140 mEq/L (ref 135–145)

## 2013-05-14 SURGERY — CYSTOURETEROSCOPY, WITH RETROGRADE PYELOGRAM AND STENT INSERTION
Anesthesia: General | Site: Ureter | Laterality: Right

## 2013-05-14 MED ORDER — SENNOSIDES 8.6 MG PO TABS: 1.0000 | ORAL_TABLET | Freq: Two times a day (BID) | ORAL | Status: DC

## 2013-05-14 MED ORDER — OXYCODONE-ACETAMINOPHEN 5-325 MG PO TABS
ORAL_TABLET | ORAL | Status: AC
  Filled 2013-05-14: qty 1

## 2013-05-14 MED ORDER — GENTAMICIN IN SALINE 1.6-0.9 MG/ML-% IV SOLN
80.0000 mg | INTRAVENOUS | Status: AC
  Administered 2013-05-14: 400 mg via INTRAVENOUS
  Filled 2013-05-14: qty 50

## 2013-05-14 MED ORDER — FENTANYL CITRATE 0.05 MG/ML IJ SOLN
INTRAMUSCULAR | Status: AC
  Filled 2013-05-14: qty 2

## 2013-05-14 MED ORDER — STERILE WATER FOR IRRIGATION IR SOLN
Status: DC | PRN
  Administered 2013-05-14: 500 mL

## 2013-05-14 MED ORDER — MIDAZOLAM HCL 5 MG/5ML IJ SOLN
INTRAMUSCULAR | Status: DC | PRN
  Administered 2013-05-14: 2 mg via INTRAVENOUS

## 2013-05-14 MED ORDER — LACTATED RINGERS IV SOLN
INTRAVENOUS | Status: DC
  Administered 2013-05-14: 08:00:00 via INTRAVENOUS
  Filled 2013-05-14: qty 1000

## 2013-05-14 MED ORDER — ONDANSETRON HCL 4 MG/2ML IJ SOLN
INTRAMUSCULAR | Status: DC | PRN
  Administered 2013-05-14: 4 mg via INTRAVENOUS

## 2013-05-14 MED ORDER — OXYBUTYNIN CHLORIDE 5 MG PO TABS
ORAL_TABLET | ORAL | Status: AC
  Filled 2013-05-14: qty 1

## 2013-05-14 MED ORDER — BELLADONNA ALKALOIDS-OPIUM 16.2-60 MG RE SUPP
RECTAL | Status: AC
  Filled 2013-05-14: qty 1

## 2013-05-14 MED ORDER — IOHEXOL 350 MG/ML SOLN
INTRAVENOUS | Status: DC | PRN
  Administered 2013-05-14: 10 mL

## 2013-05-14 MED ORDER — LIDOCAINE HCL (CARDIAC) 20 MG/ML IV SOLN
INTRAVENOUS | Status: DC | PRN
  Administered 2013-05-14: 80 mg via INTRAVENOUS

## 2013-05-14 MED ORDER — ACETAMINOPHEN 10 MG/ML IV SOLN
INTRAVENOUS | Status: DC | PRN
  Administered 2013-05-14: 1000 mg via INTRAVENOUS

## 2013-05-14 MED ORDER — PROMETHAZINE HCL 25 MG/ML IJ SOLN
6.2500 mg | INTRAMUSCULAR | Status: DC | PRN
  Filled 2013-05-14: qty 1

## 2013-05-14 MED ORDER — CIPROFLOXACIN HCL 500 MG PO TABS: 500.0000 mg | ORAL_TABLET | Freq: Two times a day (BID) | ORAL | Status: DC

## 2013-05-14 MED ORDER — GENTAMICIN SULFATE 40 MG/ML IJ SOLN
400.0000 mg | Freq: Once | INTRAVENOUS | Status: DC
  Filled 2013-05-14: qty 10

## 2013-05-14 MED ORDER — TAMSULOSIN HCL 0.4 MG PO CAPS
0.4000 mg | ORAL_CAPSULE | Freq: Every day | ORAL | Status: DC
  Administered 2013-05-14: 0.4 mg via ORAL
  Filled 2013-05-14: qty 1

## 2013-05-14 MED ORDER — SODIUM CHLORIDE 0.9 % IR SOLN
Status: DC | PRN
  Administered 2013-05-14: 2000 mL

## 2013-05-14 MED ORDER — OXYBUTYNIN CHLORIDE 5 MG PO TABS: 5.0000 mg | ORAL_TABLET | Freq: Three times a day (TID) | ORAL | Status: DC | PRN

## 2013-05-14 MED ORDER — FENTANYL CITRATE 0.05 MG/ML IJ SOLN
25.0000 ug | INTRAMUSCULAR | Status: DC | PRN
  Filled 2013-05-14: qty 1

## 2013-05-14 MED ORDER — PROPOFOL 10 MG/ML IV BOLUS
INTRAVENOUS | Status: DC | PRN
  Administered 2013-05-14: 100 mg via INTRAVENOUS
  Administered 2013-05-14: 200 mg via INTRAVENOUS

## 2013-05-14 MED ORDER — OXYCODONE-ACETAMINOPHEN 5-325 MG PO TABS
1.0000 | ORAL_TABLET | ORAL | Status: DC | PRN
  Administered 2013-05-14: 1 via ORAL
  Filled 2013-05-14: qty 1

## 2013-05-14 MED ORDER — DEXAMETHASONE SODIUM PHOSPHATE 4 MG/ML IJ SOLN
INTRAMUSCULAR | Status: DC | PRN
  Administered 2013-05-14: 10 mg via INTRAVENOUS

## 2013-05-14 MED ORDER — OXYBUTYNIN CHLORIDE 5 MG PO TABS
5.0000 mg | ORAL_TABLET | Freq: Three times a day (TID) | ORAL | Status: DC | PRN
  Administered 2013-05-14: 5 mg via ORAL
  Filled 2013-05-14: qty 1

## 2013-05-14 MED ORDER — MIDAZOLAM HCL 2 MG/2ML IJ SOLN
INTRAMUSCULAR | Status: AC
  Filled 2013-05-14: qty 2

## 2013-05-14 MED ORDER — FENTANYL CITRATE 0.05 MG/ML IJ SOLN
INTRAMUSCULAR | Status: DC | PRN
  Administered 2013-05-14 (×2): 50 ug via INTRAVENOUS

## 2013-05-14 MED ORDER — OXYCODONE-ACETAMINOPHEN 5-325 MG PO TABS: 1.0000 | ORAL_TABLET | ORAL | Status: DC | PRN

## 2013-05-14 MED ORDER — KETOROLAC TROMETHAMINE 30 MG/ML IJ SOLN
INTRAMUSCULAR | Status: DC | PRN
  Administered 2013-05-14: 30 mg via INTRAVENOUS

## 2013-05-14 MED ORDER — TAMSULOSIN HCL 0.4 MG PO CAPS
ORAL_CAPSULE | ORAL | Status: AC
  Filled 2013-05-14: qty 1

## 2013-05-14 SURGICAL SUPPLY — 42 items
BAG DRAIN URO-CYSTO SKYTR STRL (DRAIN) ×2 IMPLANT
BAG DRN UROCATH (DRAIN) ×1
BASKET LASER NITINOL 1.9FR (BASKET) ×2 IMPLANT
BASKET STNLS GEMINI 4WIRE 3FR (BASKET) IMPLANT
BASKET ZERO TIP NITINOL 2.4FR (BASKET) IMPLANT
BRUSH URET BIOPSY 3F (UROLOGICAL SUPPLIES) IMPLANT
BSKT STON RTRVL 120 1.9FR (BASKET) ×1
BSKT STON RTRVL GEM 120X11 3FR (BASKET)
BSKT STON RTRVL ZERO TP 2.4FR (BASKET)
CANISTER SUCT LVC 12 LTR MEDI- (MISCELLANEOUS) ×2 IMPLANT
CATH INTERMIT  6FR 70CM (CATHETERS) ×2 IMPLANT
CATH URET 5FR 28IN CONE TIP (BALLOONS)
CATH URET 5FR 28IN OPEN ENDED (CATHETERS) IMPLANT
CATH URET 5FR 70CM CONE TIP (BALLOONS) IMPLANT
CLOTH BEACON ORANGE TIMEOUT ST (SAFETY) ×2 IMPLANT
DRAPE CAMERA CLOSED 9X96 (DRAPES) ×2 IMPLANT
ELECT REM PT RETURN 9FT ADLT (ELECTROSURGICAL)
ELECTRODE REM PT RTRN 9FT ADLT (ELECTROSURGICAL) IMPLANT
FIBER LASER FLEXIVA 200 (UROLOGICAL SUPPLIES) ×1 IMPLANT
FIBER LASER FLEXIVA 365 (UROLOGICAL SUPPLIES) IMPLANT
GLOVE BIO SURGEON STRL SZ7 (GLOVE) ×1 IMPLANT
GLOVE BIO SURGEON STRL SZ7.5 (GLOVE) ×2 IMPLANT
GLOVE INDICATOR 7.5 STRL GRN (GLOVE) ×2 IMPLANT
GOWN PREVENTION PLUS LG XLONG (DISPOSABLE) ×4 IMPLANT
GOWN STRL REIN XL XLG (GOWN DISPOSABLE) ×2 IMPLANT
GUIDEWIRE 0.038 PTFE COATED (WIRE) IMPLANT
GUIDEWIRE ANG ZIPWIRE 038X150 (WIRE) ×2 IMPLANT
GUIDEWIRE STR DUAL SENSOR (WIRE) ×2 IMPLANT
IV NS 1000ML (IV SOLUTION) ×4
IV NS 1000ML BAXH (IV SOLUTION) IMPLANT
IV NS IRRIG 3000ML ARTHROMATIC (IV SOLUTION) ×2 IMPLANT
KIT BALLIN UROMAX 15FX10 (LABEL) IMPLANT
KIT BALLN UROMAX 15FX4 (MISCELLANEOUS) IMPLANT
KIT BALLN UROMAX 26 75X4 (MISCELLANEOUS)
PACK CYSTOSCOPY (CUSTOM PROCEDURE TRAY) ×2 IMPLANT
SET HIGH PRES BAL DIL (LABEL)
SHEATH URET ACCESS 12FR/35CM (UROLOGICAL SUPPLIES) IMPLANT
SHEATH URET ACCESS 12FR/55CM (UROLOGICAL SUPPLIES) IMPLANT
STENT POLARIS 6X26 (STENTS) ×1 IMPLANT
SYRINGE 10CC LL (SYRINGE) ×2 IMPLANT
SYRINGE IRR TOOMEY STRL 70CC (SYRINGE) IMPLANT
TUBE FEEDING 8FR 16IN STR KANG (MISCELLANEOUS) IMPLANT

## 2013-05-14 NOTE — Transfer of Care (Signed)
Immediate Anesthesia Transfer of Care Note  Patient: Lucas Rocha  Procedure(s) Performed: Procedure(s): CYSTOSCOPY WITH RETROGRADE PYELOGRAM, URETEROSCOPY AND STENT PLACEMENT (Right) HOLMIUM LASER APPLICATION (Right)  Patient Location: PACU  Anesthesia Type:General  Level of Consciousness: sedated and responds to stimulation  Airway & Oxygen Therapy: Patient Spontanous Breathing and Patient connected to nasal cannula oxygen  Post-op Assessment: Report given to PACU RN  Post vital signs: Reviewed and stable  Complications: No apparent anesthesia complications

## 2013-05-14 NOTE — H&P (Signed)
Lucas Rocha is an 48 y.o. male.    Chief Complaint: Pre-Op Rt Ureteroscopic Stone Manipulation  HPI:   1 - Rt Ureteral Stone - s/p shockwave lithotripsy 02/13/2013 for medical refractory 5mm Rt mid ureteral stone near SI joints / iliac crossing by ER CT 9/12 on w/u right flank pain. 400 HU, SSD 9.5cm. NO additional stones. Cr 1.2. Interval KUB 02/2013 with persistent distal fragment.  PMH sig for vasectomy, foot surgery. No CV disease or blood thinners.  Today Lucas Rocha is seen to proceed with ureteroscopy for his retained rt distal fragement. NO interval fevers. Most recent UA withtou infectious parameters.  Past Medical History  Diagnosis Date  . Allergic rhinitis   . Right ureteral stone   . GERD (gastroesophageal reflux disease)   . History of iron deficiency anemia   . BPH (benign prostatic hyperplasia)   . Lower urinary tract symptoms (LUTS)     Past Surgical History  Procedure Laterality Date  . Foot ganglion excision Left 1999  . Extracorporeal shock wave lithotripsy Right 02-13-2013    Family History  Problem Relation Age of Onset  . Colon cancer Neg Hx   . Diabetes Maternal Grandfather   . Heart disease      Dad's side    Social History:  reports that he has never smoked. He has never used smokeless tobacco. He reports that he does not drink alcohol or use illicit drugs.  Allergies:  Allergies  Allergen Reactions  . Penicillins Hives     AND HYPERACTIVITY  . Sulfa Antibiotics Hives    CHILDHOOD REACTION    No prescriptions prior to admission    No results found for this or any previous visit (from the past 48 hour(s)). No results found.  Review of Systems  Constitutional: Negative.  Negative for fever and chills.  HENT: Negative.   Eyes: Negative.   Respiratory: Negative.   Cardiovascular: Negative.   Gastrointestinal: Negative.   Genitourinary: Negative.   Musculoskeletal: Negative.   Skin: Negative.   Neurological: Negative.    Endo/Heme/Allergies: Negative.   Psychiatric/Behavioral: Negative.     Height 6' 1.5" (1.867 m), weight 81.647 kg (180 lb). Physical Exam  Constitutional: He is oriented to person, place, and time. He appears well-developed and well-nourished.  HENT:  Head: Normocephalic and atraumatic.  Eyes: EOM are normal. Pupils are equal, round, and reactive to light.  Neck: Normal range of motion. Neck supple.  Cardiovascular: Normal rate.   Respiratory: Effort normal.  GI: Soft.  Genitourinary: Penis normal.  Very mild Rt CVAT  Musculoskeletal: Normal range of motion.  Neurological: He is alert and oriented to person, place, and time.  Skin: Skin is warm and dry.  Psychiatric: He has a normal mood and affect. His behavior is normal. Judgment and thought content normal.     Assessment/Plan  1 - Rt Ureteral Stone - Partial response to SWL. Symptoms controlled. Discussed continued medical therapy v. repeat SWL v. ureteroscopy and pt has opted for ureteroscopy. I think this is the appropriate plan.  We rediscussed ureteroscopic stone manipulation with basketing and laser-lithotripsy in detail.  We rediscussed risks including bleeding, infection, damage to kidney / ureter  bladder, rarely loss of kidney. We rediscussed anesthetic risks and rare but serious surgical complications including DVT, PE, MI, and mortality. We specifically readdressed that in 5-10% of cases a staged approach is required with stenting followed by re-attempt ureteroscopy if anatomy unfavorable. The patient voiced understanding and wises to proceed today as planned.  Josceline Chenard 05/14/2013, 4:49 AM

## 2013-05-14 NOTE — Anesthesia Postprocedure Evaluation (Signed)
  Anesthesia Post-op Note  Patient: Lucas Rocha  Procedure(s) Performed: Procedure(s) (LRB): CYSTOSCOPY WITH RETROGRADE PYELOGRAM, URETEROSCOPY AND STENT PLACEMENT (Right) HOLMIUM LASER APPLICATION (Right)  Patient Location: PACU  Anesthesia Type: General  Level of Consciousness: awake and alert   Airway and Oxygen Therapy: Patient Spontanous Breathing  Post-op Pain: mild  Post-op Assessment: Post-op Vital signs reviewed, Patient's Cardiovascular Status Stable, Respiratory Function Stable, Patent Airway and No signs of Nausea or vomiting  Last Vitals:  Filed Vitals:   05/14/13 1000  BP: 107/67  Pulse: 73  Temp:   Resp: 15    Post-op Vital Signs: stable   Complications: No apparent anesthesia complications

## 2013-05-14 NOTE — Anesthesia Procedure Notes (Signed)
Procedure Name: LMA Insertion Date/Time: 05/14/2013 8:33 AM Performed by: Maris Berger T Pre-anesthesia Checklist: Patient identified, Emergency Drugs available, Suction available and Patient being monitored Patient Re-evaluated:Patient Re-evaluated prior to inductionOxygen Delivery Method: Circle System Utilized Preoxygenation: Pre-oxygenation with 100% oxygen Intubation Type: IV induction Ventilation: Mask ventilation without difficulty LMA: LMA inserted LMA Size: 5.0 Number of attempts: 1 Airway Equipment and Method: bite block Placement Confirmation: positive ETCO2 Dental Injury: Teeth and Oropharynx as per pre-operative assessment

## 2013-05-14 NOTE — Brief Op Note (Signed)
05/14/2013  9:12 AM  PATIENT:  Porfirio Oar  47 y.o. male  PRE-OPERATIVE DIAGNOSIS:  RIGHT URETERAL STONE  POST-OPERATIVE DIAGNOSIS:  RIGHT URETERAL STONE  PROCEDURE:  Procedure(s): CYSTOSCOPY WITH RETROGRADE PYELOGRAM, URETEROSCOPY AND STENT PLACEMENT (Right) HOLMIUM LASER APPLICATION (Right)  SURGEON:  Surgeon(s) and Role:    * Sebastian Ache, MD - Primary  PHYSICIAN ASSISTANT:   ASSISTANTS: none   ANESTHESIA:   general  EBL:  Total I/O In: 100 [I.V.:100] Out: -   BLOOD ADMINISTERED:none  DRAINS: none   LOCAL MEDICATIONS USED:  NONE  SPECIMEN:  Source of Specimen:  Rt ureteral stone  DISPOSITION OF SPECIMEN:  [facility] for compositional analysis  COUNTS:  YES  TOURNIQUET:  * No tourniquets in log *  DICTATION: .Other Dictation: Dictation Number [id number]  PLAN OF CARE: Discharge to home after PACU  PATIENT DISPOSITION:  PACU - hemodynamically stable.   Delay start of Pharmacological VTE agent (>24hrs) due to surgical blood loss or risk of bleeding: not applicable

## 2013-05-15 ENCOUNTER — Encounter (HOSPITAL_BASED_OUTPATIENT_CLINIC_OR_DEPARTMENT_OTHER): Payer: Self-pay | Admitting: Urology

## 2013-05-16 NOTE — Op Note (Signed)
NAMERASHARD, RYLE              ACCOUNT NO.:  [id number]  MEDICAL RECORD NO.:  [id number]  LOCATION:                               FACILITY:  [id number]  PHYSICIAN:  Sebastian Ache, MD     DATE OF BIRTH:  12/26/1965  DATE OF PROCEDURE: 05/14/2013 DATE OF DISCHARGE:  05/14/2013                              OPERATIVE REPORT   DIAGNOSIS:  Right distal ureteral stone.  PROCEDURE: 1. Cystoscopy with right retrograde pyelogram interpretation. 2. Right ureteroscopy with laser lithotripsy. 3. Insertion of right ureteral stent, 6 x 26, Polaris with tether to     the dorsum of the penis.  FINDINGS: 1. Retained right distal ureteral stone, too large for basketing, this     required laser lithotripsy. 2. Relative narrowing of the right intramural ureter without focal     stricture. 3. No additional ureteral or renal calculi on the right side.  SPECIMEN:  Right distal ureteral stone for compositional analysis.  COMPLICATIONS:  None.  INDICATION:  Mr. Ludlam is a very pleasant 47 year old gentleman with recent history of right renal colic.  He had a right distal ureteral stone that appeared to be favorable for shockwave lithotripsy and he underwent this approximately 2 months ago.  He unfortunately passed minimal stone material, had persistent mild colic and persistent what appeared to be distal fragment on his KUB.  Additional interval medical therapy was trialed, however, the fragments were persistent. Options were discussed for further management including repeat shockwave versus ureteroscopy and wished to proceed with the latter.  Informed consent was obtained and placed in medical record.  PROCEDURE IN DETAIL:  The patient being Athanasius Kesling was verified. Procedure being right ureteroscopic stone manipulation was confirmed. Procedure was carried out.  Time-out was performed.  Intravenous antibiotics were administered.  General LMA anesthesia was introduced. The patient was placed  into a low lithotomy position.  Sterile field was created by prepping and draping the patient's penis, perineum, and proximal thighs using iodine x3.  Next, cystourethroscopy was performed using a 22-French rigid scope with 12-degree offset lens.  Inspection of the anterior and posterior urethra were unremarkable.  Inspection of the urinary bladder revealed no diverticula, calcifications, papular lesions.  The right ureteral orifice was cannulated with a 6-French end- hole catheter and right retrograde pyelogram was obtained.  Right retrograde pyelogram demonstrated a single right ureter, single system, right kidney.  There was a filling defect in distal ureter consistent with known stone.  There was minimal hydroureteronephrosis. This was performed using 5 mL of iodinated contrast.  A 0.038 Glidewire was advanced at the level of the upper pole and set aside as a safety wire.  Next, semi-rigid ureteroscopy was performed in the distal right ureter alongside a separate Sensor working wire with an 8-French feeding tube in urinary bladder for pressure release.  This revealed distal right stone as expected.  Some relative narrowing at the right intramural ureter without focal stricture and a single attempt was made to basket the stone, however, it appeared to be too large.  Therefore, holmium laser energy was applied using a 200 nanometer fiber at settings of 0.5 joules and 5 hertz fragmenting the stone into smaller  fragments. Each of which was easily then removed and set aside for compositional analysis.  Semi-rigid ureteroscopy was then performed of the distal two- thirds of the ureter.  No mucosal abnormalities were found.  To verify stone free status on this side, the semi-rigid ureteroscope was exchanged for a 6-French nondigital ureteroscope over the sensor working wire using fluoroscopic guidance to the level of the kidney. Panendoscopy of right kidney revealed no residual  calcifications.  No evidence of perforation.  There was excellent hemostasis.  The ureteroscope was removed under continuous ureteroscopic vision and no mucosal abnormalities were found.  Given the relative narrowing distally, decision was made to proceed with right ureteral stenting.  As such, a 6 x 26, Polaris-type stent was placed over the remaining safety wire using cystoscopic and fluoroscopic guidance.  Good proximal and distal curl were noted.  Bladder was emptied per cystoscope.  Tether was fashioned in the dorsum of the penis.  Procedure was then terminated. The patient tolerated the procedure well.  There were no immediate periprocedural complications.  The patient was taken to postanesthesia care unit in stable condition.          ______________________________ Sebastian Ache, MD     TM/MEDQ  D:  05/14/2013  T:  05/15/2013  Job:  [id number]

## 2013-05-26 ENCOUNTER — Ambulatory Visit (INDEPENDENT_AMBULATORY_CARE_PROVIDER_SITE_OTHER): Payer: 59 | Admitting: Internal Medicine

## 2013-05-26 ENCOUNTER — Encounter: Payer: Self-pay | Admitting: Internal Medicine

## 2013-05-26 VITALS — BP 114/80 | Temp 98.5°F | Ht 72.5 in | Wt 186.0 lb

## 2013-05-26 DIAGNOSIS — N201 Calculus of ureter: Secondary | ICD-10-CM

## 2013-05-26 DIAGNOSIS — Z0000 Encounter for general adult medical examination without abnormal findings: Secondary | ICD-10-CM

## 2013-05-26 DIAGNOSIS — K219 Gastro-esophageal reflux disease without esophagitis: Secondary | ICD-10-CM

## 2013-05-26 DIAGNOSIS — J309 Allergic rhinitis, unspecified: Secondary | ICD-10-CM

## 2013-05-26 LAB — CBC WITH DIFFERENTIAL/PLATELET
Basophils Relative: 0.6 % (ref 0.0–3.0)
Eosinophils Relative: 3.3 % (ref 0.0–5.0)
Hemoglobin: 15.1 g/dL (ref 13.0–17.0)
Lymphocytes Relative: 27.4 % (ref 12.0–46.0)
MCHC: 33.7 g/dL (ref 30.0–36.0)
Monocytes Relative: 9.2 % (ref 3.0–12.0)
Neutro Abs: 3.6 10*3/uL (ref 1.4–7.7)
RBC: 4.98 Mil/uL (ref 4.22–5.81)

## 2013-05-26 LAB — LIPID PANEL
Cholesterol: 171 mg/dL (ref 0–200)
HDL: 47.8 mg/dL (ref >39.00)
LDL Cholesterol: 104 mg/dL — ABNORMAL HIGH (ref 0–99)
Total CHOL/HDL Ratio: 4
Triglycerides: 97 mg/dL (ref 0.0–149.0)

## 2013-05-26 LAB — COMPREHENSIVE METABOLIC PANEL
ALT: 39 U/L (ref 0–53)
AST: 24 U/L (ref 0–37)
Alkaline Phosphatase: 82 U/L (ref 39–117)
BUN: 16 mg/dL (ref 6–23)
CO2: 27 mEq/L (ref 19–32)
Calcium: 9.6 mg/dL (ref 8.4–10.5)
Chloride: 105 mEq/L (ref 96–112)
Creatinine, Ser: 1.1 mg/dL (ref 0.4–1.5)
GFR: 80.99 mL/min (ref >60.00)
Total Bilirubin: 0.7 mg/dL (ref 0.3–1.2)
Total Protein: 7.5 g/dL (ref 6.0–8.3)

## 2013-05-26 MED ORDER — OMEPRAZOLE 20 MG PO CPDR: DELAYED_RELEASE_CAPSULE | ORAL | Status: DC

## 2013-05-26 NOTE — Progress Notes (Signed)
Subjective:    Patient ID: Lucas Rocha, male    DOB: Sep 05, 1965, 47 y.o.   MRN: [id number]  HPI  47 year old patient who is seen today for a health maintenance examination. He has required recent intervention for right renal colic and presently doing quite well.  Current Allergies:  ! PENICILLIN G POTASSIUM (PENICILLIN G POTASSIUM)  ! SULF-10 (SULFACETAMIDE SODIUM)   Past Medical History:  G E R D  Allergic rhinitis   Family History:  both parents are  in good health although seen infrequent by health care providers  one sister is well   Social History:  Married two children,    Past Medical History  Diagnosis Date  . Allergic rhinitis   . Right ureteral stone   . GERD (gastroesophageal reflux disease)   . History of iron deficiency anemia   . BPH (benign prostatic hyperplasia)   . Lower urinary tract symptoms (LUTS)     History   Social History  . Marital Status: Married    Spouse Name: N/A    Number of Children: 2  . Years of Education: N/A   Occupational History  . Fire Engineer, mining     Social History Main Topics  . Smoking status: Never Smoker   . Smokeless tobacco: Never Used  . Alcohol Use: No  . Drug Use: No  . Sexual Activity: Not on file   Other Topics Concern  . Not on file   Social History Narrative   Daily caffeine     Past Surgical History  Procedure Laterality Date  . Foot ganglion excision Left 1999  . Extracorporeal shock wave lithotripsy Right 02-13-2013  . Cystoscopy with retrograde pyelogram, ureteroscopy and stent placement Right 05/14/2013    Procedure: CYSTOSCOPY WITH RETROGRADE PYELOGRAM, URETEROSCOPY AND STENT PLACEMENT;  Surgeon: Sebastian Ache, MD;  Location: [facility];  Service: Urology;  Laterality: Right;  . Holmium laser application Right 05/14/2013    Procedure: HOLMIUM LASER APPLICATION;  Surgeon: Sebastian Ache, MD;  Location: [facility];  Service: Urology;  Laterality: Right;     Family History  Problem Relation Age of Onset  . Colon cancer Neg Hx   . Diabetes Maternal Grandfather   . Heart disease      Dad's side     Allergies  Allergen Reactions  . Penicillins Hives     AND HYPERACTIVITY  . Sulfa Antibiotics Hives    CHILDHOOD REACTION    Current Outpatient Prescriptions on File Prior to Visit  Medication Sig Dispense Refill  . omeprazole (PRILOSEC) 20 MG capsule Take 1 capsule by mouth  daily-- TAKES IN MORNING      . oxymetazoline (AFRIN) 0.05 % nasal spray Place 2 sprays into the nose 2 (two) times daily.      . tamsulosin (FLOMAX) 0.4 MG CAPS capsule Take 1 capsule (0.4 mg total) by mouth daily.  20 capsule  0   No current facility-administered medications on file prior to visit.    BP 114/80  Temp(Src) 98.5 F (36.9 C) (Oral)  Ht 6' 0.5" (1.842 m)  Wt 186 lb (84.369 kg)  BMI 24.87 kg/m2       Review of Systems  Constitutional: Negative for fever, chills, activity change, appetite change and fatigue.  HENT: Negative for congestion, dental problem, ear pain, hearing loss, mouth sores, rhinorrhea, sinus pressure, sneezing, tinnitus, trouble swallowing and voice change.   Eyes: Negative for photophobia, pain, redness and visual disturbance.  Respiratory: Negative for apnea,  cough, choking, chest tightness, shortness of breath and wheezing.   Cardiovascular: Negative for chest pain, palpitations and leg swelling.  Gastrointestinal: Negative for nausea, vomiting, abdominal pain, diarrhea, constipation, blood in stool, abdominal distention, anal bleeding and rectal pain.  Genitourinary: Negative for dysuria, urgency, frequency, hematuria, flank pain, decreased urine volume, discharge, penile swelling, scrotal swelling, difficulty urinating, genital sores and testicular pain.  Musculoskeletal: Negative for arthralgias, back pain, gait problem, joint swelling, myalgias, neck pain and neck stiffness.  Skin: Negative for color change, rash and  wound.  Neurological: Negative for dizziness, tremors, seizures, syncope, facial asymmetry, speech difficulty, weakness, light-headedness, numbness and headaches.  Hematological: Negative for adenopathy. Does not bruise/bleed easily.  Psychiatric/Behavioral: Negative for suicidal ideas, hallucinations, behavioral problems, confusion, sleep disturbance, self-injury, dysphoric mood, decreased concentration and agitation. The patient is not nervous/anxious.        Objective:   Physical Exam  Constitutional: He appears well-developed and well-nourished.  HENT:  Head: Normocephalic and atraumatic.  Right Ear: External ear normal.  Left Ear: External ear normal.  Nose: Nose normal.  Mouth/Throat: Oropharynx is clear and moist.  Eyes: Conjunctivae and EOM are normal. Pupils are equal, round, and reactive to light. No scleral icterus.  Neck: Normal range of motion. Neck supple. No JVD present. No thyromegaly present.  Cardiovascular: Regular rhythm, normal heart sounds and intact distal pulses.  Exam reveals no gallop and no friction rub.   No murmur heard. Pulmonary/Chest: Effort normal and breath sounds normal. He exhibits no tenderness.  Abdominal: Soft. Bowel sounds are normal. He exhibits no distension and no mass. There is no tenderness.  Genitourinary: Prostate normal and penis normal. Guaiac negative stool.  Musculoskeletal: Normal range of motion. He exhibits no edema and no tenderness.  Lymphadenopathy:    He has no cervical adenopathy.  Neurological: He is alert. He has normal reflexes. No cranial nerve deficit. Coordination normal.  Skin: Skin is warm and dry. No rash noted.  Psychiatric: He has a normal mood and affect. His behavior is normal.          Assessment & Plan:   Preventive health examination Status post renal colic History of gastroesophageal reflux disease stable allergic rhinitis stable  Medications updated Laboratory studies will be reviewed Recheck one  year or as needed

## 2013-05-26 NOTE — Progress Notes (Signed)
Pre visit review using our clinic review tool, if applicable. No additional management support is needed unless otherwise documented below in the visit note. 

## 2013-05-26 NOTE — Patient Instructions (Addendum)
Limit your sodium (Salt) intake  Avoids foods high in acid such as tomatoes citrus juices, and spicy foods.  Avoid eating within two hours of lying down or before exercising.  Do not overheat.  Try smaller more frequent meals.  If symptoms persist, elevate the head of her bed 12 inches while sleeping.     It is important that you exercise regularly, at least 20 minutes 3 to 4 times per week.  If you develop chest pain or shortness of breath seek  medical attention.  Return in one year for follow-up

## 2013-12-03 ENCOUNTER — Ambulatory Visit (INDEPENDENT_AMBULATORY_CARE_PROVIDER_SITE_OTHER): Payer: 59 | Admitting: Internal Medicine

## 2013-12-03 ENCOUNTER — Encounter: Payer: Self-pay | Admitting: Internal Medicine

## 2013-12-03 VITALS — BP 116/80 | HR 110 | Temp 97.6°F | Resp 20 | Ht 72.5 in | Wt 181.0 lb

## 2013-12-03 DIAGNOSIS — J309 Allergic rhinitis, unspecified: Secondary | ICD-10-CM

## 2013-12-03 MED ORDER — FLUTICASONE PROPIONATE 50 MCG/ACT NA SUSP: 2.0000 | Freq: Every day | NASAL | Status: DC

## 2013-12-03 NOTE — Patient Instructions (Signed)
Minimize use of Afrin Allergic Rhinitis Allergic rhinitis is when the mucous membranes in the nose respond to allergens. Allergens are particles in the air that cause your body to have an allergic reaction. This causes you to release allergic antibodies. Through a chain of events, these eventually cause you to release histamine into the blood stream. Although meant to protect the body, it is this release of histamine that causes your discomfort, such as frequent sneezing, congestion, and an itchy, runny nose.  CAUSES  Seasonal allergic rhinitis (hay fever) is caused by pollen allergens that may come from grasses, trees, and weeds. Year-round allergic rhinitis (perennial allergic rhinitis) is caused by allergens such as house dust mites, pet dander, and mold spores.  SYMPTOMS   Nasal stuffiness (congestion).  Itchy, runny nose with sneezing and tearing of the eyes. DIAGNOSIS  Your health care provider can help you determine the allergen or allergens that trigger your symptoms. If you and your health care provider are unable to determine the allergen, skin or blood testing may be used. TREATMENT  Allergic rhinitis does not have a cure, but it can be controlled by:  Medicines and allergy shots (immunotherapy).  Avoiding the allergen. Hay fever may often be treated with antihistamines in pill or nasal spray forms. Antihistamines block the effects of histamine. There are over-the-counter medicines that may help with nasal congestion and swelling around the eyes. Check with your health care provider before taking or giving this medicine.  If avoiding the allergen or the medicine prescribed do not work, there are many new medicines your health care provider can prescribe. Stronger medicine may be used if initial measures are ineffective. Desensitizing injections can be used if medicine and avoidance does not work. Desensitization is when a patient is given ongoing shots until the body becomes less  sensitive to the allergen. Make sure you follow up with your health care provider if problems continue. HOME CARE INSTRUCTIONS It is not possible to completely avoid allergens, but you can reduce your symptoms by taking steps to limit your exposure to them. It helps to know exactly what you are allergic to so that you can avoid your specific triggers. SEEK MEDICAL CARE IF:   You have a fever.  You develop a cough that does not stop easily (persistent).  You have shortness of breath.  You start wheezing.  Symptoms interfere with normal daily activities. Document Released: 02/07/2001 Document Revised: 05/20/2013 Document Reviewed: 01/20/2013 [facility] Patient Information 2015 [place], [place]. This information is not intended to replace advice given to you by your health care provider. Make sure you discuss any questions you have with your health care provider.

## 2013-12-03 NOTE — Progress Notes (Signed)
Subjective:    Patient ID: Lucas Rocha, male    DOB: 11/30/1965, 48 y.o.   MRN: [id number]  HPI   48 year old patient who has a number of concerns.  He has a persistent nodule involving the medial aspect of the right fourth PIP joint.  This has been present since trauma to this area.  It seems to be slowly improving over the past 2 weeks.  No real inflammatory changes or pain It also complains of loud snoring.  He does have a history of significant allergic rhinitis and chronic nasal stuffiness.  Past Medical History  Diagnosis Date  . Allergic rhinitis   . Right ureteral stone   . GERD (gastroesophageal reflux disease)   . History of iron deficiency anemia   . BPH (benign prostatic hyperplasia)   . Lower urinary tract symptoms (LUTS)     History   Social History  . Marital Status: Married    Spouse Name: N/A    Number of Children: 2  . 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 Smoking status: Never Smoker   . Smokeless tobacco: Never Used  . Alcohol Use: No  . Drug Use: No  . Sexual Activity: Not on file   Other Topics Concern  . Not on file   Social History Narrative   Daily caffeine     Past Surgical History  Procedure Laterality Date  . Foot ganglion excision Left 1999  . Extracorporeal shock wave lithotripsy Right 02-13-2013  . Cystoscopy with retrograde pyelogram, ureteroscopy and stent placement Right 05/14/2013    Procedure: [place], URETEROSCOPY AND STENT PLACEMENT;  Surgeon: Alexis Frock, MD;  Location: [facility];  Service: Urology;  Laterality: Right;  . Holmium laser application Right 22/06/5425    Procedure: HOLMIUM LASER APPLICATION;  Surgeon: Alexis Frock, MD;  Location: [facility];  Service: Urology;  Laterality: Right;    Family History  Problem Relation Age of Onset  . Colon cancer Neg Hx   . Diabetes Maternal  Grandfather   . Heart disease      Dad's side     Allergies  Allergen Reactions  . Penicillins Hives     AND HYPERACTIVITY  . Sulfa Antibiotics Hives    CHILDHOOD REACTION    Current Outpatient Prescriptions on File Prior to Visit  Medication Sig Dispense Refill  . omeprazole (PRILOSEC) 20 MG capsule Take 1 capsule by mouth  daily-- TAKES IN MORNING  90 capsule  6  . oxymetazoline (AFRIN) 0.05 % nasal spray Place 2 sprays into the nose 2 (two) times daily.      . tamsulosin (FLOMAX) 0.4 MG CAPS capsule Take 1 capsule (0.4 mg total) by mouth daily.  20 capsule  0   No current facility-administered medications on file prior to visit.    BP 116/80  Pulse 110  Temp(Src) 97.6 F (36.4 C) (Oral)  Resp 20  Ht 6' 0.5" (1.842 m)  Wt 181 lb (82.101 kg)  BMI 24.20 kg/m2  SpO2 97%     Review of Systems  Constitutional: Negative for fever, chills, appetite change and fatigue.  HENT: Negative for congestion, dental problem, ear pain, hearing loss, sore throat, tinnitus, trouble swallowing and voice change.   Eyes: Negative for pain, discharge and visual disturbance.  Respiratory: Negative for cough, chest tightness, wheezing and stridor.   Cardiovascular: Negative for chest pain, palpitations  and leg swelling.  Gastrointestinal: Negative for nausea, vomiting, abdominal pain, diarrhea, constipation, blood in stool and abdominal distention.  Genitourinary: Negative for urgency, hematuria, flank pain, discharge, difficulty urinating and genital sores.  Musculoskeletal: Negative for arthralgias, back pain, gait problem, joint swelling, myalgias and neck stiffness.  Skin: Positive for wound. Negative for rash.  Neurological: Negative for dizziness, syncope, speech difficulty, weakness, numbness and headaches.  Hematological: Negative for adenopathy. Does not bruise/bleed easily.  Psychiatric/Behavioral: Negative for behavioral problems and dysphoric mood. The patient is not  nervous/anxious.        Objective:   Physical Exam  Constitutional: He is oriented to person, place, and time. He appears well-developed.  HENT:  Head: Normocephalic.  Right Ear: External ear normal.  Left Ear: External ear normal.  Nasal stuffiness No pharyngeal crowding  Eyes: Conjunctivae and EOM are normal.  Neck: Normal range of motion.  Cardiovascular: Normal rate and normal heart sounds.   Pulmonary/Chest: Breath sounds normal.  Abdominal: Bowel sounds are normal.  Musculoskeletal: Normal range of motion. He exhibits no edema and no tenderness.  Neurological: He is alert and oriented to person, place, and time.  Skin:  1 cm cystic nodule involving the medial aspect of the right PIP joint  Psychiatric: He has a normal mood and affect. His behavior is normal.          Assessment & Plan:   Snoring.  Will treat allergic rhinitis, aggressively and observe.  We'll discuss a home sleep study with his wife Traumatic  cystic nodule right hand.  This seems to be improving.  Will observe at this point in

## 2013-12-03 NOTE — Progress Notes (Signed)
Pre visit review using our clinic review tool, if applicable. No additional management support is needed unless otherwise documented below in the visit note. 

## 2014-03-05 ENCOUNTER — Ambulatory Visit (INDEPENDENT_AMBULATORY_CARE_PROVIDER_SITE_OTHER): Payer: 59 | Admitting: Family Medicine

## 2014-03-05 ENCOUNTER — Encounter: Payer: Self-pay | Admitting: Family Medicine

## 2014-03-05 VITALS — BP 110/76 | HR 107 | Temp 98.3°F | Ht 72.05 in | Wt 180.5 lb

## 2014-03-05 DIAGNOSIS — J329 Chronic sinusitis, unspecified: Secondary | ICD-10-CM

## 2014-03-05 NOTE — Progress Notes (Signed)
No chief complaint on file.   HPI:  -started:yeasterday, but has chronic allergy issues -symptoms:nasal congestion, PND, some maxillary sinus pressure and pain last night -denies:fever, SOB, NVD, tooth pain -has tried: flonase intermitently -sick contacts/travel/risks: denies flu exposure, tick exposure or or Ebola risks -Hx of: allergies   ROS: See pertinent positives and negatives per HPI.  Past Medical History  Diagnosis Date  . Allergic rhinitis   . Right ureteral stone   . GERD (gastroesophageal reflux disease)   . History of iron deficiency anemia   . BPH (benign prostatic hyperplasia)   . Lower urinary tract symptoms (LUTS)     Past Surgical History  Procedure Laterality Date  . Foot ganglion excision Left 1999  . Extracorporeal shock wave lithotripsy Right 02-13-2013  . Cystoscopy with retrograde pyelogram, ureteroscopy and stent placement Right 05/14/2013    Procedure: [place], URETEROSCOPY AND STENT PLACEMENT;  Surgeon: Alexis Frock, MD;  Location: [facility];  Service: Urology;  Laterality: Right;  . Holmium laser application Right 02/54/2706    Procedure: HOLMIUM LASER APPLICATION;  Surgeon: Alexis Frock, MD;  Location: [facility];  Service: Urology;  Laterality: Right;    Family History  Problem Relation Age of Onset  . Colon cancer Neg Hx   . Diabetes Maternal Grandfather   . Heart disease      Dad's side     History   Social History  . Marital Status: Married    Spouse Name: N/A    Number of Children: 2  . 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 Smoking status: Never Smoker   . Smokeless tobacco: Never Used  . Alcohol Use: No  . Drug Use: No  . Sexual Activity: None   Other Topics Concern  . None   Social History Narrative   Daily caffeine     Current outpatient prescriptions:fluticasone (FLONASE) 50 MCG/ACT nasal  spray, Place 2 sprays into both nostrils daily., Disp: 16 g, Rfl: 6;  omeprazole (PRILOSEC) 20 MG capsule, Take 1 capsule by mouth  daily-- TAKES IN MORNING, Disp: 90 capsule, Rfl: 6  EXAM:  Filed Vitals:   03/05/14 1306  BP: 110/76  Pulse: 107  Temp: 98.3 F (36.8 C)    Body mass index is 24.45 kg/(m^2).  GENERAL: vitals reviewed and listed above, alert, oriented, appears well hydrated and in no acute distress  HEENT: atraumatic, conjunttiva clear, no obvious abnormalities on inspection of external nose and ears, normal appearance of ear canals and TMs, clear nasal congestion, mild post oropharyngeal erythema with PND, no tonsillar edema or exudate, no sinus TTP  NECK: no obvious masses on inspection  LUNGS: clear to auscultation bilaterally, no wheezes, rales or rhonchi, good air movement  CV: HRRR, no peripheral edema  MS: moves all extremities without noticeable abnormality  PSYCH: pleasant and cooperative, no obvious depression or anxiety  ASSESSMENT AND PLAN:  Discussed the following assessment and plan:  Rhinosinusitis  -given HPI and exam findings today, a serious infection or illness is unlikely. We discussed potential etiologies, with VURI being most likely, and advised supportive care and monitoring. We discussed treatment side effects, likely course, antibiotic misuse, transmission, and signs of developing a serious illness. -of course, we advised to return or notify a doctor immediately if symptoms worsen or persist or new concerns arise.    Patient Instructions  INSTRUCTIONS FOR UPPER RESPIRATORY INFECTION:  -  plenty of rest and fluids  -nasal saline wash 2-3 times daily (use prepackaged nasal saline or bottled/distilled water if making your own)   -AFRIN 2x daily for 4 days - then STOP - do not use this longer  -flonase daily  -claritin daily  -can use tylenol or ibuprofen as directed for aches and sorethroat  -in the winter time, using a humidifier  at night is helpful (please follow cleaning instructions)  -follow up if you have fevers, facial pain persists, tooth pain, difficulty breathing or are worsening or not getting better in 5-7 days      Meryl Hubers R.

## 2014-03-05 NOTE — Progress Notes (Signed)
Pre visit review using our clinic review tool, if applicable. No additional management support is needed unless otherwise documented below in the visit note. 

## 2014-03-05 NOTE — Patient Instructions (Signed)
INSTRUCTIONS FOR UPPER RESPIRATORY INFECTION:  -plenty of rest and fluids  -nasal saline wash 2-3 times daily (use prepackaged nasal saline or bottled/distilled water if making your own)   -AFRIN 2x daily for 4 days - then STOP - do not use this longer  -flonase daily  -claritin daily  -can use tylenol or ibuprofen as directed for aches and sorethroat  -in the winter time, using a humidifier at night is helpful (please follow cleaning instructions)  -follow up if you have fevers, facial pain persists, tooth pain, difficulty breathing or are worsening or not getting better in 5-7 days

## 2014-05-29 ENCOUNTER — Other Ambulatory Visit: Payer: Self-pay | Admitting: Internal Medicine

## 2014-08-03 ENCOUNTER — Other Ambulatory Visit: Payer: Self-pay | Admitting: Internal Medicine

## 2014-08-25 LAB — LIPID PANEL
Cholesterol: 189 mg/dL (ref 0–200)
HDL: 54 mg/dL (ref 35–70)
LDL CALC: 119 mg/dL
LDl/HDL Ratio: 2.2
Triglycerides: 79 mg/dL (ref 40–160)

## 2014-08-25 LAB — CBC AND DIFFERENTIAL
HCT: 47 % (ref 41–53)
Hemoglobin: 16.2 g/dL (ref 13.5–17.5)
NEUTROS ABS: 3 /uL
Platelets: 290 10*3/uL (ref 150–399)
WBC: 6 10^3/mL

## 2014-08-25 LAB — PSA: PSA: 0.6

## 2014-08-25 LAB — BASIC METABOLIC PANEL
BUN: 15 mg/dL (ref 4–21)
Creatinine: 1.1 mg/dL (ref 0.6–1.3)
Glucose: 112 mg/dL
Potassium: 4.8 mmol/L (ref 3.4–5.3)
Sodium: 140 mmol/L (ref 137–147)

## 2014-08-25 LAB — TSH: TSH: 1.51 u[IU]/mL (ref 0.41–5.90)

## 2014-08-25 LAB — HEPATIC FUNCTION PANEL
ALK PHOS: 89 U/L (ref 25–125)
ALT: 24 U/L (ref 10–40)
AST: 22 U/L (ref 14–40)
Bilirubin, Total: 0.7 mg/dL

## 2014-10-22 ENCOUNTER — Other Ambulatory Visit: Payer: Self-pay | Admitting: Internal Medicine

## 2014-11-05 ENCOUNTER — Telehealth: Payer: Self-pay | Admitting: Internal Medicine

## 2014-11-05 DIAGNOSIS — J309 Allergic rhinitis, unspecified: Secondary | ICD-10-CM

## 2014-11-05 MED ORDER — OMEPRAZOLE 20 MG PO CPDR: DELAYED_RELEASE_CAPSULE | ORAL | Status: DC

## 2014-11-05 NOTE — Telephone Encounter (Signed)
Left message on voicemail Rx sent to pharmacy as requested. 

## 2014-11-05 NOTE — Telephone Encounter (Signed)
Pt needs refill on omeprazole 20 mg #90 send to [facility]. Pt has a physical sch for 12/21/14

## 2014-11-16 MED ORDER — OMEPRAZOLE 20 MG PO CPDR: DELAYED_RELEASE_CAPSULE | ORAL | Status: DC

## 2014-11-16 MED ORDER — FLUTICASONE PROPIONATE 50 MCG/ACT NA SUSP: 2.0000 | Freq: Every day | NASAL | Status: DC

## 2014-11-16 NOTE — Addendum Note (Signed)
Addended by: Marian Sorrow on: 11/16/2014 10:55 AM   Modules accepted: Orders

## 2014-11-16 NOTE — Telephone Encounter (Signed)
Pt notified Rx's sent to [facility] and Rx sent to [facility]. Pt verbalized understanding.

## 2014-11-16 NOTE — Telephone Encounter (Signed)
Pt states [facility] never  received the omeprazole (PRILOSEC) 20 MG capsule.  His wife works for [employer] and she confirmed they never got. Also, pt would like to add the fluticasone (FLONASE) 50 MCG/ACT nasal spray  Pt would like a 30 day omeprazole (PRILOSEC) 20 MG capsule sent to  [facility] [address].

## 2014-12-14 ENCOUNTER — Other Ambulatory Visit: Payer: Self-pay

## 2014-12-21 ENCOUNTER — Encounter: Payer: Self-pay | Admitting: Internal Medicine

## 2014-12-23 ENCOUNTER — Ambulatory Visit (INDEPENDENT_AMBULATORY_CARE_PROVIDER_SITE_OTHER): Payer: 59 | Admitting: Internal Medicine

## 2014-12-23 ENCOUNTER — Encounter: Payer: Self-pay | Admitting: Internal Medicine

## 2014-12-23 VITALS — BP 124/82 | HR 92 | Temp 98.1°F | Resp 20 | Ht 71.75 in | Wt 186.0 lb

## 2014-12-23 DIAGNOSIS — Z0000 Encounter for general adult medical examination without abnormal findings: Secondary | ICD-10-CM

## 2014-12-23 NOTE — Patient Instructions (Signed)

## 2014-12-23 NOTE — Progress Notes (Signed)
Pre visit review using our clinic review tool, if applicable. No additional management support is needed unless otherwise documented below in the visit note. 

## 2014-12-23 NOTE — Progress Notes (Signed)
Subjective:    Patient ID: Lucas Rocha, male    DOB: 10/24/1965, 49 y.o.   MRN: [id number]  HPI    Subjective:    Patient ID: Lucas Rocha, male    DOB: Sep 07, 1965, 49 y.o.   MRN: [id number]  HPI  49 year-old patient who is seen today for a health maintenance examination. He has required  intervention for right renal colic in the past and presently doing quite well. Patient has had EDG and colonoscopy in 2013.  One of 3 polyps at that time was adenomatous  He had a work related physical including lab and PFTs in March, which was reviewed.   Current Allergies:  ! PENICILLIN G POTASSIUM (PENICILLIN G POTASSIUM)  ! SULF-10 (SULFACETAMIDE SODIUM)   Past Medical History:  G E R D  Allergic rhinitis   Family History:  Father died of complications of metastatic cancer unclear etiology.  Mother has arthritis.  Otherwise, doing well one sister is well   Social History:  Married two children,    Past Medical History  Diagnosis Date  . Allergic rhinitis   . Right ureteral stone   . GERD (gastroesophageal reflux disease)   . History of iron deficiency anemia   . BPH (benign prostatic hyperplasia)   . Lower urinary tract symptoms (LUTS)     History   Social History  . Marital Status: Married    Spouse Name: N/A  . Number of Children: 2  . 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 Smoking status: Never Smoker   . Smokeless tobacco: Never Used  . Alcohol Use: No  . Drug Use: No  . Sexual Activity: Not on file   Other Topics Concern  . Not on file   Social History Narrative   Daily caffeine     Past Surgical History  Procedure Laterality Date  . Foot ganglion excision Left 1999  . Extracorporeal shock wave lithotripsy Right 02-13-2013  . Cystoscopy with retrograde pyelogram, ureteroscopy and stent placement Right 05/14/2013    Procedure: [place], URETEROSCOPY AND  STENT PLACEMENT;  Surgeon: Alexis Frock, MD;  Location: [facility];  Service: Urology;  Laterality: Right;  . Holmium laser application Right 78/29/5621    Procedure: HOLMIUM LASER APPLICATION;  Surgeon: Alexis Frock, MD;  Location: [facility];  Service: Urology;  Laterality: Right;    Family History  Problem Relation Age of Onset  . Colon cancer Neg Hx   . Diabetes Maternal Grandfather   . Heart disease      Dad's side     Allergies  Allergen Reactions  . Penicillins Hives     AND HYPERACTIVITY  . Sulfa Antibiotics Hives    CHILDHOOD REACTION    Current Outpatient Prescriptions on File Prior to Visit  Medication Sig Dispense Refill  . fluticasone (FLONASE) 50 MCG/ACT nasal spray Place 2 sprays into both nostrils daily. 48 g 3  . omeprazole (PRILOSEC) 20 MG capsule Take 1 capsule by mouth  daily in the morning 30 capsule 0   No current facility-administered medications on file prior to visit.    BP 140/90 mmHg  Pulse 92  Temp(Src) 98.1 F (36.7 C) (Oral)  Resp 20  Ht 5' 11.75" (1.822 m)  Wt 186 lb (84.369 kg)  BMI 25.41 kg/m2  SpO2 98%       Review of Systems  Constitutional:  Negative for fever, chills, activity change, appetite change and fatigue.  HENT: Negative for congestion, dental problem, ear pain, hearing loss, mouth sores, rhinorrhea, sinus pressure, sneezing, tinnitus, trouble swallowing and voice change.   Eyes: Negative for photophobia, pain, redness and visual disturbance.  Respiratory: Negative for apnea, cough, choking, chest tightness, shortness of breath and wheezing.   Cardiovascular: Negative for chest pain, palpitations and leg swelling.  Gastrointestinal: Negative for nausea, vomiting, abdominal pain, diarrhea, constipation, blood in stool, abdominal distention, anal bleeding and rectal pain.  Genitourinary: Negative for dysuria, urgency, frequency, hematuria, flank pain, decreased urine volume, discharge,  penile swelling, scrotal swelling, difficulty urinating, genital sores and testicular pain.  Musculoskeletal: Negative for arthralgias, back pain, gait problem, joint swelling, myalgias, neck pain and neck stiffness.  Skin: Negative for color change, rash and wound.  Neurological: Negative for dizziness, tremors, seizures, syncope, facial asymmetry, speech difficulty, weakness, light-headedness, numbness and headaches.  Hematological: Negative for adenopathy. Does not bruise/bleed easily.  Psychiatric/Behavioral: Negative for suicidal ideas, hallucinations, behavioral problems, confusion, sleep disturbance, self-injury, dysphoric mood, decreased concentration and agitation. The patient is not nervous/anxious.        Objective:   Physical Exam  Constitutional: He appears well-developed and well-nourished.  HENT:  Head: Normocephalic and atraumatic.  Right Ear: External ear normal.  Left Ear: External ear normal.  Nose: Nose normal.  Mouth/Throat: Oropharynx is clear and moist.  Eyes: Conjunctivae and EOM are normal. Pupils are equal, round, and reactive to light. No scleral icterus.  Neck: Normal range of motion. Neck supple. No JVD present. No thyromegaly present.  Cardiovascular: Regular rhythm, normal heart sounds and intact distal pulses.  Exam reveals no gallop and no friction rub.   No murmur heard. Pulmonary/Chest: Effort normal and breath sounds normal. He exhibits no tenderness.  Abdominal: Soft. Bowel sounds are normal. He exhibits no distension and no mass. There is no tenderness.  Genitourinary: Unremarkable  Musculoskeletal: Normal range of motion. He exhibits no edema and no tenderness.  Lymphadenopathy:    He has no cervical adenopathy.  Neurological: He is alert. He has normal reflexes. No cranial nerve deficit. Coordination normal.  Skin: Skin is warm and dry. No rash noted.  Psychiatric: He has a normal mood and affect. His behavior is normal.          Assessment  & Plan:   Preventive health examination Status post renal colic History of gastroesophageal reflux disease stable allergic rhinitis stable History colonic polyps.  Follow-up colonoscopy 2018  Medications updated Laboratory studies will be reviewed Recheck one year or as needed   Review of Systems    as above Objective:   Physical Exam  Constitutional:  Repeat blood pressure 124/82    As above      Assessment & Plan:   As above

## 2014-12-24 ENCOUNTER — Encounter: Payer: Self-pay | Admitting: Internal Medicine

## 2015-09-25 ENCOUNTER — Other Ambulatory Visit: Payer: Self-pay | Admitting: Internal Medicine

## 2015-10-18 ENCOUNTER — Telehealth: Payer: Self-pay | Admitting: Internal Medicine

## 2015-10-18 NOTE — Telephone Encounter (Signed)
Okay 

## 2015-10-18 NOTE — Telephone Encounter (Signed)
Fine with me- he could schedule his first visit as physical/establish together as appears reasonably healthy

## 2015-10-18 NOTE — Telephone Encounter (Signed)
Pt would like to switch to Dr Yong Channel from Dr Burnice Logan.  Pt states due to provider availability and he does not want to wait until the last minute to change over if/when dr k cuts back even more. Ok with you Dr Yong Channel? OK with you Dr Raliegh Ip?

## 2015-10-20 NOTE — Telephone Encounter (Signed)
Left message for pt to call back  °

## 2015-10-21 NOTE — Telephone Encounter (Signed)
Pt has been sch

## 2016-01-19 ENCOUNTER — Ambulatory Visit (INDEPENDENT_AMBULATORY_CARE_PROVIDER_SITE_OTHER): Payer: 59 | Admitting: Family Medicine

## 2016-01-19 ENCOUNTER — Encounter: Payer: Self-pay | Admitting: Family Medicine

## 2016-01-19 VITALS — BP 108/82 | HR 91 | Temp 97.9°F | Ht 72.25 in | Wt 186.2 lb

## 2016-01-19 DIAGNOSIS — K219 Gastro-esophageal reflux disease without esophagitis: Secondary | ICD-10-CM | POA: Diagnosis not present

## 2016-01-19 DIAGNOSIS — N400 Enlarged prostate without lower urinary tract symptoms: Secondary | ICD-10-CM | POA: Diagnosis not present

## 2016-01-19 DIAGNOSIS — Z0000 Encounter for general adult medical examination without abnormal findings: Secondary | ICD-10-CM | POA: Diagnosis not present

## 2016-01-19 DIAGNOSIS — G4733 Obstructive sleep apnea (adult) (pediatric): Secondary | ICD-10-CM | POA: Diagnosis not present

## 2016-01-19 LAB — COMPREHENSIVE METABOLIC PANEL
ALK PHOS: 98 U/L (ref 39–117)
ALT: 24 U/L (ref 0–53)
AST: 19 U/L (ref 0–37)
Albumin: 4.3 g/dL (ref 3.5–5.2)
BUN: 10 mg/dL (ref 6–23)
CHLORIDE: 101 meq/L (ref 96–112)
CO2: 32 mEq/L (ref 19–32)
Calcium: 9.4 mg/dL (ref 8.4–10.5)
Creatinine, Ser: 1.02 mg/dL (ref 0.40–1.50)
GFR: 82.79 mL/min (ref >60.00)
GLUCOSE: 100 mg/dL — AB (ref 70–99)
POTASSIUM: 4.1 meq/L (ref 3.5–5.1)
SODIUM: 139 meq/L (ref 135–145)
TOTAL PROTEIN: 7.4 g/dL (ref 6.0–8.3)
Total Bilirubin: 0.6 mg/dL (ref 0.2–1.2)

## 2016-01-19 LAB — POC URINALSYSI DIPSTICK (AUTOMATED)
BILIRUBIN UA: NEGATIVE
Blood, UA: NEGATIVE
Glucose, UA: NEGATIVE
KETONES UA: NEGATIVE
Leukocytes, UA: NEGATIVE
Nitrite, UA: NEGATIVE
Protein, UA: NEGATIVE
Spec Grav, UA: 1.01
Urobilinogen, UA: 0.2
pH, UA: 7

## 2016-01-19 LAB — CBC
HEMATOCRIT: 45.8 % (ref 39.0–52.0)
Hemoglobin: 15.6 g/dL (ref 13.0–17.0)
MCHC: 34.1 g/dL (ref 30.0–36.0)
MCV: 89 fl (ref 78.0–100.0)
Platelets: 324 10*3/uL (ref 150.0–400.0)
RBC: 5.15 Mil/uL (ref 4.22–5.81)
RDW: 13.5 % (ref 11.5–15.5)
WBC: 6.2 10*3/uL (ref 4.0–10.5)

## 2016-01-19 LAB — LIPID PANEL
Cholesterol: 177 mg/dL (ref 0–200)
HDL: 53.2 mg/dL (ref >39.00)
LDL CALC: 104 mg/dL — AB (ref 0–99)
NONHDL: 124.24
Total CHOL/HDL Ratio: 3
Triglycerides: 100 mg/dL (ref 0.0–149.0)
VLDL: 20 mg/dL (ref 0.0–40.0)

## 2016-01-19 LAB — PSA: PSA: 0.6 ng/mL (ref 0.10–4.00)

## 2016-01-19 NOTE — Assessment & Plan Note (Signed)
S: controlled with prilosec. Has been on at least 20 years.  A/P: discussed option of zantac, wants to continue PPI for now

## 2016-01-19 NOTE — Progress Notes (Signed)
Phone: [phone]  Subjective:  Patient presents today for their annual physical. Chief complaint-noted.   See problem oriented charting- ROS- full  review of systems was completed and negative including No chest pain or shortness of breath. No headache or blurry vision.   The following were reviewed and entered/updated in epic: Past Medical History:  Diagnosis Date  . Allergic rhinitis   . BPH (benign prostatic hyperplasia)    some nocturia  . GERD (gastroesophageal reflux disease)   . History of iron deficiency anemia   . Lower urinary tract symptoms (LUTS)   . OSA (obstructive sleep apnea)    oral appliance  . Right ureteral stone    Patient Active Problem List   Diagnosis Date Noted  . G E R D 12/28/2006    Priority: Medium  . Ureterolithiasis 02/07/2013    Priority: Low  . Gastric polyp 12/12/2011    Priority: Low  . Diverticulosis 12/07/2011    Priority: Low  . Hx of adenomatous colonic polyps 12/07/2011    Priority: Low  . Iron deficiency anemia 10/24/2011    Priority: Low  . Allergic rhinitis 01/01/2007    Priority: Low  . BPH (benign prostatic hyperplasia)   . OSA (obstructive sleep apnea)    Past Surgical History:  Procedure Laterality Date  . CYSTOSCOPY WITH RETROGRADE PYELOGRAM, URETEROSCOPY AND STENT PLACEMENT Right 05/14/2013   Procedure: CYSTOSCOPY WITH RETROGRADE PYELOGRAM, URETEROSCOPY AND STENT PLACEMENT;  Surgeon: Alexis Frock, MD;  Location: [facility];  Service: Urology;  Laterality: Right;  . EXTRACORPOREAL SHOCK WAVE LITHOTRIPSY Right 02-13-2013  . FOOT GANGLION EXCISION Left 1999  . HOLMIUM LASER APPLICATION Right [id number]   Procedure: HOLMIUM LASER APPLICATION;  Surgeon: Alexis Frock, MD;  Location: [facility];  Service: Urology;  Laterality: Right;    Family History  Problem Relation Age of Onset  . Arthritis Mother   . Cancer Father     unknown- not worked up 87  . Diabetes Maternal Grandfather    . Heart disease      Dad's side - grandfather, grandmother (big MI)  . Colon cancer Neg Hx     Medications- reviewed and updated Current Outpatient Prescriptions  Medication Sig Dispense Refill  . fluticasone (FLONASE) 50 MCG/ACT nasal spray Place 2 sprays into both nostrils daily. 48 g 3  . omeprazole (PRILOSEC) 20 MG capsule Take 1 capsule by mouth  daily in the morning 90 capsule 1   No current facility-administered medications for this visit.     Allergies-reviewed and updated Allergies  Allergen Reactions  . Penicillins Hives     AND HYPERACTIVITY  . Sulfa Antibiotics Hives    CHILDHOOD REACTION    Social History   Social History  . Marital status: Married    Spouse name: N/A  . Number of children: 2  . Years of education: N/A   Occupational History  . [employer]   Social History Main Topics  . Smoking status: Never Smoker  . Smokeless tobacco: Never Used  . Alcohol use Yes     Comment: rare alcohol  . Drug use: No  . Sexual activity: Not Asked   Other Topics Concern  . None   Social History Narrative   Married. 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 Oropharynx normal Neck: no thyromegaly CV: RRR no murmurs rubs or gallops Lungs: CTAB no crackles, wheeze, rhonchi Abdomen: soft/nontender/nondistended/normal bowel sounds. No rebound or guarding.  Rectal: normal tone, diffusely enlarged prostate, no masses or tenderness Ext: no edema Skin: warm, dry Neuro: grossly normal, moves all extremities, PERRLA  Assessment/Plan:  50 y.o. male presenting for  annual physical.  Health Maintenance counseling: 1. Anticipatory guidance: Patient counseled regarding regular dental exams, eye exams (wears readers no other issues), wearing seatbelts.  2. Risk factor reduction:  Advised patient of need for regular exercise and diet rich and fruits and vegetables to reduce risk of heart attack and stroke. Exercise below what he would want due to work schedule. Weight reasonable- has crept up some other years.  3. Immunizations/screenings/ancillary studies Immunization History  Administered Date(s) Administered  . Influenza Split 02/26/2013  . Tdap 10/24/2011   Health Maintenance Due  Topic Date Due  . HIV Screening - advised, has already given blood to [facility] 11/17/1982  . INFLUENZA VACCINE - advised, will get through work 12/28/2015   4. Prostate cancer screening- had PSA last year, will update with BPH symptoms. Nocturia stable though at twice a night Lab Results  Component Value Date   PSA 0.6 08/25/2014   5. Colon cancer screening - 11/2011 with serrated adenoma- 5 year follow up 6. Skin cancer screening- has an area of growth appears of cartilage on right eat- prior scab on this. We discussed if grows, changes, worsens, let me know and can refer to dermatology  Status of chronic or acute concerns   G E R D S: controlled with prilosec. Has been on at least 20 years.  A/P: discussed option of zantac, wants to continue PPI for now  Allergies- controlled on flonase  Return in about 1 year (around 01/18/2017) for physical.  Orders Placed This Encounter  Procedures  . CBC    [place]  . Comprehensive metabolic panel    [place]    Order Specific Question:   Has the patient fasted?    Answer:   No  . Lipid panel    [place]    Order Specific Question:   Has the patient fasted?    Answer:   No  . PSA  . POCT Urinalysis Dipstick (Automated)   Return precautions advised.   Garret Reddish, MD

## 2016-01-19 NOTE — Patient Instructions (Addendum)
Doing well- update labs before you leave  Maybe knock off 5 lbs but weight pretty reasonable.   Watch the spot on right ear- if it grows or changes let me know- we can refer to dermatology

## 2016-01-19 NOTE — Progress Notes (Signed)
Pre visit review using our clinic review tool, if applicable. No additional management support is needed unless otherwise documented below in the visit note. 

## 2016-02-19 ENCOUNTER — Other Ambulatory Visit: Payer: Self-pay | Admitting: Internal Medicine

## 2016-06-26 ENCOUNTER — Encounter: Payer: Self-pay | Admitting: Family Medicine

## 2016-06-26 ENCOUNTER — Ambulatory Visit (INDEPENDENT_AMBULATORY_CARE_PROVIDER_SITE_OTHER): Payer: 59 | Admitting: Family Medicine

## 2016-06-26 VITALS — BP 108/72 | HR 107 | Temp 98.1°F | Ht 72.25 in | Wt 188.6 lb

## 2016-06-26 DIAGNOSIS — J0100 Acute maxillary sinusitis, unspecified: Secondary | ICD-10-CM | POA: Diagnosis not present

## 2016-06-26 MED ORDER — DOXYCYCLINE HYCLATE 100 MG PO TABS: 100.0000 mg | ORAL_TABLET | Freq: Two times a day (BID) | ORAL | 0 refills | Status: DC

## 2016-06-26 MED ORDER — PREDNISONE 20 MG PO TABS: ORAL_TABLET | ORAL | 0 refills | Status: DC

## 2016-06-26 NOTE — Progress Notes (Signed)
Pre visit review using our clinic review tool, if applicable. No additional management support is needed unless otherwise documented below in the visit note. 

## 2016-06-26 NOTE — Patient Instructions (Signed)
Sinsusitis Viral based on <10 days, no double sickening, lack of severity of symptoms in first 3 days. Educated on signs that bacterial infection may have developed (symptoms over 10 days, double sickening). With that being said he has a plane flight Wednesday to Friday and we are worried about increasing pressure in sinuses being on plane so opted to be more aggressive in treatment with prednisone and doxycycline today. Discussed may be viral and antibiotic may not be effective. Use mask on plane, good handwiashing  Treatment: -considered steroid: we opted in -other symptomatic care with OTC allergy medicine -Antibiotic indicated: see discussion above  Finally, we reviewed reasons to return to care including if symptoms worsen or persist or new concerns arise (particularly fever or shortness of breath)  Meds ordered this encounter  Medications  . predniSONE (DELTASONE) 20 MG tablet    Sig: Take 1 tablet by mouth daily for 5 days, then 1/2 tablet daily for 2 days    Dispense:  6 tablet    Refill:  0  . doxycycline (VIBRA-TABS) 100 MG tablet    Sig: Take 1 tablet (100 mg total) by mouth 2 (two) times daily.    Dispense:  14 tablet    Refill:  0

## 2016-06-26 NOTE — Progress Notes (Signed)
PCP: Garret Reddish, MD  Subjective:  Lucas Rocha is a 51 y.o. year old very pleasant male patient who presents with sinusitis symptoms including nasal congestion, sinus tenderness left frontal and maxillary -other symptoms include: yellow discharge from nose -day of illness:4  -Symptoms are worsening -previous treatments: afrin, fexofenadine -sick contacts/travel/risks: denies flu exposure.  -Hx of: allergies- yes on baseline flonase  ROS-denies fever, SOB, NVD, tooth pain  Pertinent Past Medical History-  Patient Active Problem List   Diagnosis Date Noted  . G E R D 12/28/2006    Priority: Medium  . Ureterolithiasis 02/07/2013    Priority: Low  . Gastric polyp 12/12/2011    Priority: Low  . Diverticulosis 12/07/2011    Priority: Low  . Hx of adenomatous colonic polyps 12/07/2011    Priority: Low  . Iron deficiency anemia 10/24/2011    Priority: Low  . Allergic rhinitis 01/01/2007    Priority: Low  . BPH (benign prostatic hyperplasia)   . OSA (obstructive sleep apnea)     Medications- reviewed  Current Outpatient Prescriptions  Medication Sig Dispense Refill  . fluticasone (FLONASE) 50 MCG/ACT nasal spray Place 2 sprays into both nostrils daily. 48 g 3  . omeprazole (PRILOSEC) 20 MG capsule Take 1 capsule by mouth  daily in the morning 90 capsule 1   No current facility-administered medications for this visit.     Objective: BP 108/72 (BP Location: Left Arm, Patient Position: Sitting, Cuff Size: Large)   Pulse (!) 107   Temp 98.1 F (36.7 C) (Oral)   Ht 6' 0.25" (1.835 m)   Wt 188 lb 9.6 oz (85.5 kg)   SpO2 97%   BMI 25.40 kg/m  Gen: NAD, resting comfortably HEENT: Turbinates erythematous with yellow drainage, TM normal, pharynx mildly erythematous with no tonsilar exudate or edema, maxillary left sided sinus tenderness CV: RRR no murmurs rubs or gallops Lungs: CTAB no crackles, wheeze, rhonchi Abdomen: soft/nontender/nondistended/normal bowel sounds. No  rebound or guarding.  Ext: no edema Skin: warm, dry, no rash Neuro: grossly normal, moves all extremities  Assessment/Plan:  Sinsusitis Viral based on <10 days, no double sickening, lack of severity of symptoms in first 3 days. Educated on signs that bacterial infection may have developed (symptoms over 10 days, double sickening). With that being said he has a plane flight Wednesday to Friday and we are worried about increasing pressure in sinuses being on plane so opted to be more aggressive in treatment with prednisone and doxycycline today. Discussed may be viral and antibiotic may not be effective. Use mask on plane, good handwiashing  Treatment: -considered steroid: we opted in -other symptomatic care with OTC allergy medicine -Antibiotic indicated: see discussion above  Finally, we reviewed reasons to return to care including if symptoms worsen or persist or new concerns arise (particularly fever or shortness of breath)  Meds ordered this encounter  Medications  . predniSONE (DELTASONE) 20 MG tablet    Sig: Take 1 tablet by mouth daily for 5 days, then 1/2 tablet daily for 2 days    Dispense:  6 tablet    Refill:  0  . doxycycline (VIBRA-TABS) 100 MG tablet    Sig: Take 1 tablet (100 mg total) by mouth 2 (two) times daily.    Dispense:  14 tablet    Refill:  0    Garret Reddish, MD

## 2016-07-31 ENCOUNTER — Other Ambulatory Visit: Payer: Self-pay | Admitting: Family Medicine

## 2016-09-21 ENCOUNTER — Encounter: Payer: Self-pay | Admitting: *Deleted

## 2016-10-17 ENCOUNTER — Encounter: Payer: Self-pay | Admitting: Internal Medicine

## 2016-12-09 DIAGNOSIS — J018 Other acute sinusitis: Secondary | ICD-10-CM | POA: Diagnosis not present

## 2017-01-02 ENCOUNTER — Other Ambulatory Visit: Payer: Self-pay | Admitting: Family Medicine

## 2017-06-19 ENCOUNTER — Other Ambulatory Visit: Payer: Self-pay | Admitting: Family Medicine

## 2017-07-05 ENCOUNTER — Telehealth: Payer: Self-pay | Admitting: Family Medicine

## 2017-07-05 MED ORDER — OMEPRAZOLE 20 MG PO CPDR: 20.0000 mg | DELAYED_RELEASE_CAPSULE | Freq: Every day | ORAL | 0 refills | Status: DC

## 2017-07-05 NOTE — Telephone Encounter (Signed)
Copied from [place]. Topic: Quick Communication - Rx Refill/Question >> Jul 05, 2017  2:30 PM Ahmed Prima L wrote: Medication: omeprazole (PRILOSEC) 20 MG capsule  Has physical sch for 4/22  Has the patient contacted their pharmacy?yes   (Agent: If no, request that the patient contact the pharmacy for the refill.)   Preferred Pharmacy (with phone number or street name): [facility], [address]   Agent: Please be advised that RX refills may take up to 3 business days. We ask that you follow-up with your pharmacy.

## 2017-08-01 DIAGNOSIS — J019 Acute sinusitis, unspecified: Secondary | ICD-10-CM | POA: Diagnosis not present

## 2017-08-28 ENCOUNTER — Other Ambulatory Visit: Payer: Self-pay | Admitting: Family Medicine

## 2017-09-17 ENCOUNTER — Ambulatory Visit (INDEPENDENT_AMBULATORY_CARE_PROVIDER_SITE_OTHER): Payer: 59 | Admitting: Family Medicine

## 2017-09-17 ENCOUNTER — Encounter: Payer: Self-pay | Admitting: Family Medicine

## 2017-09-17 VITALS — BP 126/82 | HR 104 | Temp 98.4°F | Ht 72.25 in | Wt 194.0 lb

## 2017-09-17 DIAGNOSIS — K219 Gastro-esophageal reflux disease without esophagitis: Secondary | ICD-10-CM

## 2017-09-17 DIAGNOSIS — E785 Hyperlipidemia, unspecified: Secondary | ICD-10-CM

## 2017-09-17 DIAGNOSIS — N529 Male erectile dysfunction, unspecified: Secondary | ICD-10-CM

## 2017-09-17 DIAGNOSIS — Z0000 Encounter for general adult medical examination without abnormal findings: Secondary | ICD-10-CM | POA: Diagnosis not present

## 2017-09-17 DIAGNOSIS — Z79899 Other long term (current) drug therapy: Secondary | ICD-10-CM | POA: Diagnosis not present

## 2017-09-17 DIAGNOSIS — R351 Nocturia: Secondary | ICD-10-CM

## 2017-09-17 DIAGNOSIS — N401 Enlarged prostate with lower urinary tract symptoms: Secondary | ICD-10-CM | POA: Diagnosis not present

## 2017-09-17 DIAGNOSIS — Z1283 Encounter for screening for malignant neoplasm of skin: Secondary | ICD-10-CM

## 2017-09-17 HISTORY — DX: Male erectile dysfunction, unspecified: N52.9

## 2017-09-17 MED ORDER — SILDENAFIL CITRATE 100 MG PO TABS: 100.0000 mg | ORAL_TABLET | Freq: Every day | ORAL | 11 refills | Status: DC | PRN

## 2017-09-17 NOTE — Patient Instructions (Addendum)
Health Maintenance Due  Topic Date Due  . COLONOSCOPY - Patient will make an appointment with his GI doctor. Call # on letter from Dr. Hilarie Fredrickson 11/27/2016   We will call you within a week or two about your referral to dermatology. If you do not hear within 3 weeks, give [place] a call.   Schedule a lab visit at the check out desk within 2-3 weeks. Return for future fasting labs meaning nothing but water after midnight please. Ok to take your medications with water.   Try viagra half tab to start. If doesn't work can use full tab

## 2017-09-17 NOTE — Assessment & Plan Note (Signed)
He prefers continued PPI (apparently zantac more expensive and doesn't help as much)- we have discussed zantac trial in past. We will check b12 as well given long term use

## 2017-09-17 NOTE — Progress Notes (Signed)
Phone: [phone]  Subjective:  Patient presents today for their annual physical. Chief complaint-noted.   See problem oriented charting- ROS- full  review of systems was completed and negative except for: far sighted, urinary frequency with nocturia, seasonal allergies  The following were reviewed and entered/updated in epic: Past Medical History:  Diagnosis Date  . Allergic rhinitis   . BPH (benign prostatic hyperplasia)    some nocturia  . GERD (gastroesophageal reflux disease)   . History of iron deficiency anemia   . Lower urinary tract symptoms (LUTS)   . OSA (obstructive sleep apnea)    oral appliance  . Right ureteral stone    Patient Active Problem List   Diagnosis Date Noted  . BPH (benign prostatic hyperplasia)     Priority: Medium  . G E R D 12/28/2006    Priority: Medium  . Erectile dysfunction 09/17/2017    Priority: Low  . OSA (obstructive sleep apnea)     Priority: Low  . Ureterolithiasis 02/07/2013    Priority: Low  . Gastric polyp 12/12/2011    Priority: Low  . Diverticulosis 12/07/2011    Priority: Low  . Hx of adenomatous colonic polyps 12/07/2011    Priority: Low  . Iron deficiency anemia 10/24/2011    Priority: Low  . Allergic rhinitis 01/01/2007    Priority: Low   Past Surgical History:  Procedure Laterality Date  . CYSTOSCOPY WITH RETROGRADE PYELOGRAM, URETEROSCOPY AND STENT PLACEMENT Right 05/14/2013   Procedure: CYSTOSCOPY WITH RETROGRADE PYELOGRAM, URETEROSCOPY AND STENT PLACEMENT;  Surgeon: Alexis Frock, MD;  Location: [facility];  Service: Urology;  Laterality: Right;  . EXTRACORPOREAL SHOCK WAVE LITHOTRIPSY Right 02-13-2013  . FOOT GANGLION EXCISION Left 1999  . HOLMIUM LASER APPLICATION Right 54/00/8676   Procedure: HOLMIUM LASER APPLICATION;  Surgeon: Alexis Frock, MD;  Location: [facility];  Service: Urology;  Laterality: Right;    Family History  Problem Relation Age of Onset  . Arthritis  Mother   . Cancer Father        unknown- not worked up 16  . Diabetes Maternal Grandfather   . Heart disease Unknown        Dad's side - grandfather, grandmother (big MI)  . Colon cancer Neg Hx     Medications- reviewed and updated Current Outpatient Medications  Medication Sig Dispense Refill  . fluticasone (FLONASE) 50 MCG/ACT nasal spray Place 2 sprays into both nostrils daily. 48 g 3  . omeprazole (PRILOSEC) 20 MG capsule TAKE 1 CAPSULE BY MOUTH  DAILY 90 capsule 0  . sildenafil (VIAGRA) 100 MG tablet Take 1 tablet (100 mg total) by mouth daily as needed for erectile dysfunction. 10 tablet 11   No current facility-administered medications for this visit.     Allergies-reviewed and updated Allergies  Allergen Reactions  . Penicillins Hives     AND HYPERACTIVITY  . Sulfa Antibiotics Hives    CHILDHOOD REACTION    Social History   Social History Narrative   Married. 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 Oropharynx normal Neck: no thyromegaly CV: RRR no murmurs rubs or gallops. HR normal range on my exam Lungs: CTAB no crackles, wheeze, rhonchi Abdomen: soft/nontender/nondistended/normal bowel sounds. No rebound or guarding.  Ext: no edema Skin: warm, dry Neuro: grossly normal, moves all extremities, PERRLA Rectal: normal tone, diffusely enlarged prostate, no masses or tenderness  Assessment/Plan:  52 y.o. male presenting for annual physical.  Health Maintenance counseling: 1. Anticipatory guidance: Patient counseled regarding regular dental exams -q6 months, eye exams - will be seeing Dr. Ellie Lunch- only wears  readers though, wearing seatbelts.  2. Risk factor reduction:  Advised patient of need for regular exercise and diet rich and fruits and vegetables to reduce risk of heart attack and stroke.  Weight still creeping up some.  Exercise- trying to get things in order at work to build more time for himself to exercise. Diet-discussed tightening diet up some. .  Wt Readings from Last 3 Encounters:  09/17/17 194 lb (88 kg)  06/26/16 188 lb 9.6 oz (85.5 kg)  01/19/16 186 lb 3.2 oz (84.5 kg)  3. Immunizations/screenings/ancillary studies- discussed shingrix next year Immunization History  Administered Date(s) Administered  . Influenza Split 02/26/2013  . Influenza-Unspecified 02/16/2016  . Tdap 10/24/2011   4. Prostate cancer screening-  he requests screening - discussed age 57 guidelines. Does have some BPH so ok to follow. Nocturia twice a night - up to 4x a night. Has seen Dr. Tresa Moore in past Lab Results  Component Value Date   PSA 0.60 01/19/2016   PSA 0.6 08/25/2014   5. Colon cancer screening - printed letter for him to follow up with GI. 11/2011 with serrated adenoma- 5 year follow up. He is overdue 6. Skin cancer screening/prevention- no dermatologist- wants a referral to dermatology.  advised regular sunscreen use. Denies worrisome, changing, or new skin lesions.  7. Testicular cancer screening- advised monthly self exams  8. STD screening- patient opts out- monogomous  Status of chronic or acute concerns   Mild hyperlipidemia and hyperglycemia- check lipid panel and glucose fasting Seasonal allergies- using otc nasal spray  Erectile dysfunction Still getting erections but not quite as firm. Still with AM erections. Trial viagra. Discussed potential sign of atherosclerosis  G E R D He prefers continued PPI (apparently zantac more expensive and doesn't help as much)- we have discussed zantac trial in past. We will check b12 as well given long term use   Return in about 1 year (around  09/18/2018) for physical.  Lab/Order associations: Preventative health care - Plan: Ambulatory referral to Dermatology, Lipid panel, CBC, Comprehensive metabolic panel, PSA, POCT Urinalysis Dipstick (Automated), Vitamin B12  Skin cancer screening - Plan: Ambulatory referral to Dermatology  Encounter for long-term current use of medication - Plan: Vitamin B12  Hyperlipidemia, unspecified hyperlipidemia type - Plan: Lipid panel, CBC, Comprehensive metabolic panel, POCT Urinalysis Dipstick (Automated)  Benign prostatic hyperplasia with nocturia - Plan: PSA, POCT Urinalysis Dipstick (Automated)  Meds ordered this encounter  Medications  . sildenafil (VIAGRA) 100 MG tablet    Sig: Take 1 tablet (100 mg total) by mouth daily as needed for erectile dysfunction.    Dispense:  10 tablet    Refill:  11   Return precautions advised.   Garret Reddish, MD Except that

## 2017-09-17 NOTE — Assessment & Plan Note (Signed)
Still getting erections but not quite as firm. Still with AM erections. Trial viagra. Discussed potential sign of atherosclerosis

## 2017-09-19 ENCOUNTER — Other Ambulatory Visit (INDEPENDENT_AMBULATORY_CARE_PROVIDER_SITE_OTHER): Payer: 59

## 2017-09-19 DIAGNOSIS — R351 Nocturia: Secondary | ICD-10-CM | POA: Diagnosis not present

## 2017-09-19 DIAGNOSIS — N401 Enlarged prostate with lower urinary tract symptoms: Secondary | ICD-10-CM | POA: Diagnosis not present

## 2017-09-19 DIAGNOSIS — E785 Hyperlipidemia, unspecified: Secondary | ICD-10-CM | POA: Diagnosis not present

## 2017-09-19 DIAGNOSIS — Z79899 Other long term (current) drug therapy: Secondary | ICD-10-CM | POA: Diagnosis not present

## 2017-09-19 DIAGNOSIS — Z0000 Encounter for general adult medical examination without abnormal findings: Secondary | ICD-10-CM

## 2017-09-19 LAB — COMPREHENSIVE METABOLIC PANEL
ALK PHOS: 90 U/L (ref 39–117)
ALT: 26 U/L (ref 0–53)
AST: 21 U/L (ref 0–37)
Albumin: 4.2 g/dL (ref 3.5–5.2)
BUN: 10 mg/dL (ref 6–23)
CALCIUM: 9.8 mg/dL (ref 8.4–10.5)
CO2: 29 mEq/L (ref 19–32)
Chloride: 103 mEq/L (ref 96–112)
Creatinine, Ser: 0.95 mg/dL (ref 0.40–1.50)
GFR: 89.25 mL/min (ref >60.00)
Glucose, Bld: 94 mg/dL (ref 70–99)
POTASSIUM: 3.9 meq/L (ref 3.5–5.1)
SODIUM: 139 meq/L (ref 135–145)
TOTAL PROTEIN: 7.1 g/dL (ref 6.0–8.3)
Total Bilirubin: 1.1 mg/dL (ref 0.2–1.2)

## 2017-09-19 LAB — CBC
HCT: 44.2 % (ref 39.0–52.0)
HEMOGLOBIN: 15.4 g/dL (ref 13.0–17.0)
MCHC: 35 g/dL (ref 30.0–36.0)
MCV: 91.2 fl (ref 78.0–100.0)
Platelets: 282 10*3/uL (ref 150.0–400.0)
RBC: 4.84 Mil/uL (ref 4.22–5.81)
RDW: 12.5 % (ref 11.5–15.5)
WBC: 8.2 10*3/uL (ref 4.0–10.5)

## 2017-09-19 LAB — LIPID PANEL
CHOLESTEROL: 163 mg/dL (ref 0–200)
HDL: 50.4 mg/dL (ref >39.00)
LDL Cholesterol: 96 mg/dL (ref 0–99)
NONHDL: 112.67
Total CHOL/HDL Ratio: 3
Triglycerides: 84 mg/dL (ref 0.0–149.0)
VLDL: 16.8 mg/dL (ref 0.0–40.0)

## 2017-09-19 LAB — PSA: PSA: 0.39 ng/mL (ref 0.10–4.00)

## 2017-09-19 LAB — VITAMIN B12: Vitamin B-12: 238 pg/mL (ref 211–911)

## 2017-09-21 DIAGNOSIS — J019 Acute sinusitis, unspecified: Secondary | ICD-10-CM | POA: Diagnosis not present

## 2017-10-29 DIAGNOSIS — H43813 Vitreous degeneration, bilateral: Secondary | ICD-10-CM | POA: Diagnosis not present

## 2017-10-30 ENCOUNTER — Telehealth: Payer: Self-pay | Admitting: Family Medicine

## 2017-10-30 NOTE — Telephone Encounter (Signed)
Copied from [place] [id number]. Topic: General - Other >> Oct 30, 2017 12:33 PM Synthia Innocent wrote: Reason for CRM: Requesting to have a sleep apnea machine. Had sleep study last year. Please advise

## 2017-10-30 NOTE — Telephone Encounter (Signed)
Called and spoke to patient as I can not locate a sleep study in the chart. Patient stated he would find the results and drop them off at our office. We can proceed after he drops off results

## 2017-11-01 DIAGNOSIS — G4733 Obstructive sleep apnea (adult) (pediatric): Secondary | ICD-10-CM | POA: Diagnosis not present

## 2017-11-04 DIAGNOSIS — G4733 Obstructive sleep apnea (adult) (pediatric): Secondary | ICD-10-CM | POA: Diagnosis not present

## 2017-11-08 DIAGNOSIS — G4733 Obstructive sleep apnea (adult) (pediatric): Secondary | ICD-10-CM | POA: Diagnosis not present

## 2017-11-25 ENCOUNTER — Other Ambulatory Visit: Payer: Self-pay | Admitting: Family Medicine

## 2017-12-08 DIAGNOSIS — G4733 Obstructive sleep apnea (adult) (pediatric): Secondary | ICD-10-CM | POA: Diagnosis not present

## 2018-01-03 DIAGNOSIS — L814 Other melanin hyperpigmentation: Secondary | ICD-10-CM | POA: Diagnosis not present

## 2018-01-03 DIAGNOSIS — D1801 Hemangioma of skin and subcutaneous tissue: Secondary | ICD-10-CM | POA: Diagnosis not present

## 2018-01-03 DIAGNOSIS — L821 Other seborrheic keratosis: Secondary | ICD-10-CM | POA: Diagnosis not present

## 2018-01-03 DIAGNOSIS — L570 Actinic keratosis: Secondary | ICD-10-CM | POA: Diagnosis not present

## 2018-01-08 DIAGNOSIS — G4733 Obstructive sleep apnea (adult) (pediatric): Secondary | ICD-10-CM | POA: Diagnosis not present

## 2018-01-09 DIAGNOSIS — G4733 Obstructive sleep apnea (adult) (pediatric): Secondary | ICD-10-CM | POA: Diagnosis not present

## 2018-02-04 DIAGNOSIS — G4733 Obstructive sleep apnea (adult) (pediatric): Secondary | ICD-10-CM | POA: Diagnosis not present

## 2018-02-08 DIAGNOSIS — G4733 Obstructive sleep apnea (adult) (pediatric): Secondary | ICD-10-CM | POA: Diagnosis not present

## 2018-03-10 DIAGNOSIS — G4733 Obstructive sleep apnea (adult) (pediatric): Secondary | ICD-10-CM | POA: Diagnosis not present

## 2018-04-01 ENCOUNTER — Encounter: Payer: Self-pay | Admitting: Internal Medicine

## 2018-04-10 DIAGNOSIS — G4733 Obstructive sleep apnea (adult) (pediatric): Secondary | ICD-10-CM | POA: Diagnosis not present

## 2018-04-19 ENCOUNTER — Encounter: Payer: Self-pay | Admitting: Internal Medicine

## 2018-05-03 ENCOUNTER — Encounter: Payer: 59 | Admitting: Internal Medicine

## 2018-05-04 ENCOUNTER — Other Ambulatory Visit: Payer: Self-pay | Admitting: Family Medicine

## 2018-05-10 ENCOUNTER — Ambulatory Visit (AMBULATORY_SURGERY_CENTER): Payer: Self-pay | Admitting: *Deleted

## 2018-05-10 ENCOUNTER — Encounter: Payer: Self-pay | Admitting: Internal Medicine

## 2018-05-10 ENCOUNTER — Other Ambulatory Visit: Payer: Self-pay

## 2018-05-10 VITALS — Ht 72.0 in | Wt 196.4 lb

## 2018-05-10 DIAGNOSIS — Z86010 Personal history of colonic polyps: Secondary | ICD-10-CM

## 2018-05-10 DIAGNOSIS — G4733 Obstructive sleep apnea (adult) (pediatric): Secondary | ICD-10-CM | POA: Diagnosis not present

## 2018-05-10 MED ORDER — SUPREP BOWEL PREP KIT 17.5-3.13-1.6 GM/177ML PO SOLN: 1.0000 | Freq: Once | ORAL | 0 refills | Status: AC

## 2018-05-10 NOTE — Progress Notes (Signed)
No egg or soy allergy known to patient  No issues with past sedation with any surgeries  or procedures, no intubation problems  No diet pills per patient No home 02 use per patient  No blood thinners per patient  Pt denies issues with constipation  No A fib or A flutter  EMMI video sent to pt's e mail  

## 2018-05-24 ENCOUNTER — Encounter: Payer: Self-pay | Admitting: Internal Medicine

## 2018-05-24 ENCOUNTER — Ambulatory Visit (AMBULATORY_SURGERY_CENTER): Payer: 59 | Admitting: Internal Medicine

## 2018-05-24 VITALS — BP 121/66 | HR 73 | Temp 98.4°F | Resp 44 | Ht 72.0 in | Wt 196.0 lb

## 2018-05-24 DIAGNOSIS — Z86010 Personal history of colonic polyps: Secondary | ICD-10-CM | POA: Diagnosis not present

## 2018-05-24 DIAGNOSIS — Z1211 Encounter for screening for malignant neoplasm of colon: Secondary | ICD-10-CM | POA: Diagnosis not present

## 2018-05-24 HISTORY — PX: COLONOSCOPY WITH PROPOFOL: SHX5780

## 2018-05-24 MED ORDER — SODIUM CHLORIDE 0.9 % IV SOLN: 500.0000 mL | INTRAVENOUS | Status: DC

## 2018-05-24 NOTE — Op Note (Signed)
[facility] Patient Name: Lucas Rocha Procedure Date: 05/24/2018 1:32 PM MRN: [id number] Endoscopist: Jerene Bears , MD Age: 52 Referring MD:  Date of Birth: Jun 07, 1965 Gender: Male Account #: [id number] Procedure:                Colonoscopy Indications:              High risk colon cancer surveillance: Personal                            history of sessile serrated colon polyp (less than                            10 mm in size) with no dysplasia, Last colonoscopy:                            2013 Medicines:                Monitored Anesthesia Care Procedure:                Pre-Anesthesia Assessment:                           - Prior to the procedure, a History and Physical                            was performed, and patient medications and                            allergies were reviewed. The patient's tolerance of                            previous anesthesia was also reviewed. The risks                            and benefits of the procedure and the sedation                            options and risks were discussed with the patient.                            All questions were answered, and informed consent                            was obtained. Prior Anticoagulants: The patient has                            taken no previous anticoagulant or antiplatelet                            agents. ASA Grade Assessment: II - A patient with                            mild systemic disease. After reviewing the risks  and benefits, the patient was deemed in                            satisfactory condition to undergo the procedure.                           After obtaining informed consent, the colonoscope                            was passed under direct vision. Throughout the                            procedure, the patient's blood pressure, pulse, and                            oxygen saturations were monitored continuously. The                      Colonoscope was introduced through the anus and                            advanced to the cecum, identified by appendiceal                            orifice and ileocecal valve. The colonoscopy was                            performed without difficulty. The patient tolerated                            the procedure well. The quality of the bowel                            preparation was good. The ileocecal valve,                            appendiceal orifice, and rectum were photographed. Scope In: 1:41:39 PM Scope Out: 1:56:28 PM Scope Withdrawal Time: 0 hours 10 minutes 56 seconds  Total Procedure Duration: 0 hours 14 minutes 49 seconds  Findings:                 The digital rectal exam was normal.                           The colon (entire examined portion) appeared normal.                           The retroflexed view of the distal rectum and anal                            verge was normal and showed no anal or rectal                            abnormalities. Complications:            No immediate  complications. Estimated Blood Loss:     Estimated blood loss: none. Impression:               - The entire examined colon is normal.                           - The distal rectum and anal verge are normal on                            retroflexion view.                           - No specimens collected. Recommendation:           - Patient has a contact number available for                            emergencies. The signs and symptoms of potential                            delayed complications were discussed with the                            patient. Return to normal activities tomorrow.                            Written discharge instructions were provided to the                            patient.                           - Resume previous diet.                           - Continue present medications.                           - Repeat colonoscopy in  10 years for surveillance. Jerene Bears, MD 05/24/2018 1:59:02 PM This report has been signed electronically.

## 2018-05-24 NOTE — Patient Instructions (Signed)
YOU HAD AN ENDOSCOPIC PROCEDURE TODAY AT THE [facility]:   Refer to the procedure report that was given to you for any specific questions about what was found during the examination.  If the procedure report does not answer your questions, please call your gastroenterologist to clarify.  If you requested that your care partner not be given the details of your procedure findings, then the procedure report has been included in a sealed envelope for you to review at your convenience later.  YOU SHOULD EXPECT: Some feelings of bloating in the abdomen. Passage of more gas than usual.  Walking can help get rid of the air that was put into your GI tract during the procedure and reduce the bloating. If you had a lower endoscopy (such as a colonoscopy or flexible sigmoidoscopy) you may notice spotting of blood in your stool or on the toilet paper. If you underwent a bowel prep for your procedure, you may not have a normal bowel movement for a few days.  Please Note:  You might notice some irritation and congestion in your nose or some drainage.  This is from the oxygen used during your procedure.  There is no need for concern and it should clear up in a day or so.  SYMPTOMS TO REPORT IMMEDIATELY:   Following lower endoscopy (colonoscopy or flexible sigmoidoscopy):  Excessive amounts of blood in the stool  Significant tenderness or worsening of abdominal pains  Swelling of the abdomen that is new, acute  Fever of 100F or higher  For urgent or emergent issues, a gastroenterologist can be reached at any hour by calling [phone].   DIET:  We do recommend a small meal at first, but then you may proceed to your regular diet.  Drink plenty of fluids but you should avoid alcoholic beverages for 24 hours.  ACTIVITY:  You should plan to take it easy for the rest of today and you should NOT DRIVE or use heavy machinery until tomorrow (because of the sedation medicines used during the test).     FOLLOW UP: Our staff will call the number listed on your records the next business day following your procedure to check on you and address any questions or concerns that you may have regarding the information given to you following your procedure. If we do not reach you, we will leave a message.  However, if you are feeling well and you are not experiencing any problems, there is no need to return our call.  We will assume that you have returned to your regular daily activities without incident.  If any biopsies were taken you will be contacted by phone or by letter within the next 1-3 weeks.  Please call us at [phone] if you have not heard about the biopsies in 3 weeks.    SIGNATURES/CONFIDENTIALITY: You and/or your care partner have signed paperwork which will be entered into your electronic medical record.  These signatures attest to the fact that that the information above on your After Visit Summary has been reviewed and is understood.  Full responsibility of the confidentiality of this discharge information lies with you and/or your care-partner. 

## 2018-05-24 NOTE — Progress Notes (Signed)
PT taken to PACU. Monitors in place. VSS. Report given to RN. 

## 2018-05-27 ENCOUNTER — Telehealth: Payer: Self-pay

## 2018-05-27 NOTE — Telephone Encounter (Signed)
  Follow up Call-  Call back number 05/24/2018  Post procedure Call Back phone  # [phone]  Permission to leave phone message Yes  Some recent data might be hidden     Patient questions:  Do you have a fever, pain , or abdominal swelling? No. Pain Score  0 *  Have you tolerated food without any problems? Yes.    Have you been able to return to your normal activities? Yes.    Do you have any questions about your discharge instructions: Diet   No. Medications  No. Follow up visit  No.  Do you have questions or concerns about your Care? No.  Actions: * If pain score is 4 or above: No action needed, pain <4.

## 2018-06-07 DIAGNOSIS — G4733 Obstructive sleep apnea (adult) (pediatric): Secondary | ICD-10-CM | POA: Diagnosis not present

## 2018-06-10 DIAGNOSIS — G4733 Obstructive sleep apnea (adult) (pediatric): Secondary | ICD-10-CM | POA: Diagnosis not present

## 2018-07-08 DIAGNOSIS — L570 Actinic keratosis: Secondary | ICD-10-CM | POA: Diagnosis not present

## 2018-07-08 DIAGNOSIS — L819 Disorder of pigmentation, unspecified: Secondary | ICD-10-CM | POA: Diagnosis not present

## 2018-07-11 DIAGNOSIS — G4733 Obstructive sleep apnea (adult) (pediatric): Secondary | ICD-10-CM | POA: Diagnosis not present

## 2018-08-09 DIAGNOSIS — G4733 Obstructive sleep apnea (adult) (pediatric): Secondary | ICD-10-CM | POA: Diagnosis not present

## 2018-09-19 ENCOUNTER — Encounter: Payer: 59 | Admitting: Family Medicine

## 2018-10-25 ENCOUNTER — Other Ambulatory Visit: Payer: Self-pay | Admitting: Family Medicine

## 2019-01-27 ENCOUNTER — Telehealth: Payer: Self-pay | Admitting: Physical Therapy

## 2019-01-27 NOTE — Telephone Encounter (Signed)
Forwarding to admin to contact about upcoming appt 9/1 with Dr. Yong Channel

## 2019-01-27 NOTE — Telephone Encounter (Signed)
Copied from [place] [id number]. Topic: Appointment Scheduling - Scheduling Inquiry for Clinic >> Jan 27, 2019 12:48 PM Rayann Heman wrote: Reason for CRM: pt calling back to do covid screening. Please call back.

## 2019-01-28 ENCOUNTER — Encounter: Payer: Self-pay | Admitting: Family Medicine

## 2019-01-28 ENCOUNTER — Ambulatory Visit (INDEPENDENT_AMBULATORY_CARE_PROVIDER_SITE_OTHER): Payer: 59 | Admitting: Family Medicine

## 2019-01-28 ENCOUNTER — Encounter

## 2019-01-28 ENCOUNTER — Other Ambulatory Visit: Payer: Self-pay

## 2019-01-28 VITALS — BP 120/84 | HR 91 | Temp 98.6°F | Ht 72.0 in | Wt 188.2 lb

## 2019-01-28 DIAGNOSIS — Z23 Encounter for immunization: Secondary | ICD-10-CM | POA: Diagnosis not present

## 2019-01-28 DIAGNOSIS — Z13220 Encounter for screening for lipoid disorders: Secondary | ICD-10-CM

## 2019-01-28 DIAGNOSIS — Z79899 Other long term (current) drug therapy: Secondary | ICD-10-CM

## 2019-01-28 DIAGNOSIS — K219 Gastro-esophageal reflux disease without esophagitis: Secondary | ICD-10-CM

## 2019-01-28 DIAGNOSIS — Z7722 Contact with and (suspected) exposure to environmental tobacco smoke (acute) (chronic): Secondary | ICD-10-CM | POA: Diagnosis not present

## 2019-01-28 DIAGNOSIS — Z125 Encounter for screening for malignant neoplasm of prostate: Secondary | ICD-10-CM | POA: Diagnosis not present

## 2019-01-28 DIAGNOSIS — Z0000 Encounter for general adult medical examination without abnormal findings: Secondary | ICD-10-CM | POA: Diagnosis not present

## 2019-01-28 DIAGNOSIS — E663 Overweight: Secondary | ICD-10-CM

## 2019-01-28 DIAGNOSIS — G4733 Obstructive sleep apnea (adult) (pediatric): Secondary | ICD-10-CM

## 2019-01-28 LAB — POC URINALSYSI DIPSTICK (AUTOMATED)
Bilirubin, UA: NEGATIVE
Blood, UA: NEGATIVE
Glucose, UA: NEGATIVE
Ketones, UA: NEGATIVE
Leukocytes, UA: NEGATIVE
Nitrite, UA: NEGATIVE
Protein, UA: NEGATIVE
Spec Grav, UA: 1.025 (ref 1.010–1.025)
Urobilinogen, UA: 1 E.U./dL
pH, UA: 6 (ref 5.0–8.0)

## 2019-01-28 LAB — LIPID PANEL
Cholesterol: 160 mg/dL (ref 0–200)
HDL: 46.7 mg/dL (ref >39.00)
LDL Cholesterol: 95 mg/dL (ref 0–99)
NonHDL: 113.38
Total CHOL/HDL Ratio: 3
Triglycerides: 94 mg/dL (ref 0.0–149.0)
VLDL: 18.8 mg/dL (ref 0.0–40.0)

## 2019-01-28 LAB — PSA: PSA: 0.41 ng/mL (ref 0.10–4.00)

## 2019-01-28 LAB — COMPREHENSIVE METABOLIC PANEL
ALT: 27 U/L (ref 0–53)
AST: 20 U/L (ref 0–37)
Albumin: 4.2 g/dL (ref 3.5–5.2)
Alkaline Phosphatase: 88 U/L (ref 39–117)
BUN: 11 mg/dL (ref 6–23)
CO2: 31 mEq/L (ref 19–32)
Calcium: 9.6 mg/dL (ref 8.4–10.5)
Chloride: 102 mEq/L (ref 96–112)
Creatinine, Ser: 1.08 mg/dL (ref 0.40–1.50)
GFR: 72.03 mL/min (ref >60.00)
Glucose, Bld: 86 mg/dL (ref 70–99)
Potassium: 4.5 mEq/L (ref 3.5–5.1)
Sodium: 139 mEq/L (ref 135–145)
Total Bilirubin: 0.6 mg/dL (ref 0.2–1.2)
Total Protein: 7 g/dL (ref 6.0–8.3)

## 2019-01-28 LAB — CBC
HCT: 45.2 % (ref 39.0–52.0)
Hemoglobin: 15.3 g/dL (ref 13.0–17.0)
MCHC: 33.9 g/dL (ref 30.0–36.0)
MCV: 91.3 fl (ref 78.0–100.0)
Platelets: 309 10*3/uL (ref 150.0–400.0)
RBC: 4.95 Mil/uL (ref 4.22–5.81)
RDW: 12.8 % (ref 11.5–15.5)
WBC: 5.4 10*3/uL (ref 4.0–10.5)

## 2019-01-28 LAB — VITAMIN B12: Vitamin B-12: 315 pg/mL (ref 211–911)

## 2019-01-28 MED ORDER — OMEPRAZOLE 20 MG PO CPDR: 20.0000 mg | DELAYED_RELEASE_CAPSULE | Freq: Every day | ORAL | 3 refills | Status: DC

## 2019-01-28 MED ORDER — SILDENAFIL CITRATE 100 MG PO TABS: 100.0000 mg | ORAL_TABLET | Freq: Every day | ORAL | 11 refills | Status: DC | PRN

## 2019-01-28 NOTE — Progress Notes (Signed)
Phone: [phone]   Subjective:  Patient presents today for their annual physical. Chief complaint-noted.   See problem oriented charting- ROS- full  review of systems was completed and negative except for: joint pain, light sensitivity, nocturia  The following were reviewed and entered/updated in epic: Past Medical History:  Diagnosis Date  . Allergic rhinitis   . Allergy   . Anemia   . BPH (benign prostatic hyperplasia)    some nocturia  . GERD (gastroesophageal reflux disease)   . History of iron deficiency anemia   . Lower urinary tract symptoms (LUTS)   . OSA (obstructive sleep apnea)    oral appliance  . Right ureteral stone   . Sleep apnea    cpap   Patient Active Problem List   Diagnosis Date Noted  . BPH (benign prostatic hyperplasia)     Priority: Medium  . G E R D 12/28/2006    Priority: Medium  . Erectile dysfunction 09/17/2017    Priority: Low  . OSA (obstructive sleep apnea)     Priority: Low  . Ureterolithiasis 02/07/2013    Priority: Low  . Gastric polyp 12/12/2011    Priority: Low  . Diverticulosis 12/07/2011    Priority: Low  . Hx of adenomatous colonic polyps 12/07/2011    Priority: Low  . Iron deficiency anemia 10/24/2011    Priority: Low  . Allergic rhinitis 01/01/2007    Priority: Low   Past Surgical History:  Procedure Laterality Date  . COLONOSCOPY    . CYSTOSCOPY WITH RETROGRADE PYELOGRAM, URETEROSCOPY AND STENT PLACEMENT Right 05/14/2013   Procedure: CYSTOSCOPY WITH RETROGRADE PYELOGRAM, URETEROSCOPY AND STENT PLACEMENT;  Surgeon: Alexis Frock, MD;  Location: [facility];  Service: Urology;  Laterality: Right;  . EXTRACORPOREAL SHOCK WAVE LITHOTRIPSY Right 02-13-2013  . FOOT GANGLION EXCISION Left 1999  . HOLMIUM LASER APPLICATION Right [id number]   Procedure: HOLMIUM LASER APPLICATION;  Surgeon: Alexis Frock, MD;  Location: [facility];  Service: Urology;  Laterality: Right;  . POLYPECTOMY      Family History  Problem Relation Age of Onset  . Arthritis Mother   . Cancer Father        unknown- not worked up 41  . Diabetes Maternal Grandfather   . Heart disease Other        Dad's side - grandfather, grandmother (big MI)  . Colon cancer Neg Hx   . Colon polyps Neg Hx   . Esophageal cancer Neg Hx   . Stomach cancer Neg Hx   . Rectal cancer Neg Hx     Medications- reviewed and updated Current Outpatient Medications  Medication Sig Dispense Refill  . fexofenadine (ALLEGRA) 60 MG tablet Take 60 mg by mouth 2 (two) times daily.    . fluticasone (FLONASE) 50 MCG/ACT nasal spray Place 2 sprays into both nostrils daily. 48 g 3  . omeprazole (PRILOSEC) 20 MG capsule Take 1 capsule (20 mg total) by mouth daily. 90 capsule 3  . sildenafil (VIAGRA) 100 MG tablet Take 1 tablet (100 mg total) by mouth daily as needed for erectile dysfunction. 10 tablet 11   No current facility-administered medications for this visit.     Allergies-reviewed and updated Allergies  Allergen Reactions  . Penicillins Hives     AND HYPERACTIVITY  . Sulfa Antibiotics Hives    CHILDHOOD REACTION    Social History   Social History Narrative   Married. 2 daughters. 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  married.       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 Oropharynx normal Neck: no thyromegaly CV: RRR no murmurs rubs or gallops Lungs: CTAB no crackles, wheeze, rhonchi Abdomen: soft/nontender/nondistended/normal bowel sounds. No rebound or guarding.  Ext: no edema Skin: warm, dry Neuro: grossly normal, moves all extremities, PERRLA    Assessment and Plan  53 y.o. male presenting for annual physical.  Health Maintenance counseling: 1. Anticipatory guidance: Patient counseled regarding regular dental exams -q6 months, eye exams -yearly with Dr. Ellie Lunch- readers only,  avoiding smoking and second hand smoke , limiting alcohol to 2 beverages per day .   2. Risk factor reduction:  Advised patient of need for regular exercise and diet rich and fruits and vegetables to reduce risk of heart attack and stroke. Exercise- was doing really well until covid- trying to do some walking but no regular schedule. Diet-reasonably healthy diet- has lost 6 lbs (trying to watch what hes eating)- has also cut back on portions. Weight stable over last year largely- up just 2 lbs. Overweight per bmi but hes in good shape Wt Readings from Last 3 Encounters:  01/28/19 188 lb 3.2 oz (85.4 kg)  05/24/18 196 lb (88.9 kg)  05/10/18 196 lb 6.4 oz (89.1 kg)  3. Immunizations/screenings/ancillary studies- flu shot - get at work and he will send [place] a message when complete.. Discussed shingrix - opts in.  Immunization History  Administered Date(s) Administered  . Influenza Split 02/26/2013  . Influenza-Unspecified 02/16/2016  . Tdap 10/24/2011  4. Prostate cancer screening-  wants PSA before age 51. Some BPH in past. Nocturia 4x a night last year- now down to 0-1x a night (thinks cpap helped). Dr. Tresa Moore has seen him in past Lab Results  Component Value Date   PSA 0.39 09/19/2017   PSA 0.60 01/19/2016   PSA 0.6 08/25/2014   5. Colon cancer screening - 11/2011 serrated adenoma. Advised 5 year follow up- we advised follow up last year. He had this done 05/24/18 and was completely clean so spaced back out to 10 years per Dr. Hilarie Fredrickson.  6. Skin cancer screening-referred to Dermatology last year  - saw [facility] 01/03/18- he has plans to see a new dermatologist- sounds like [facility]. advised regular sunscreen use. Denies worrisome, changing, or new skin lesions.   7. Never smoker  Status of chronic or acute concerns  Allergic Rhinitis - Taking Allegra and Flonase.   GERD - Taking Omeprazole 20 mg. - failed zantac in past. On continued PPI. Low normal b12 last year- may consider MV  Erectile Dysfunction - Taking Sildenafil 100 mg prn. Working well  Benign Prostatic Hyperplasia- see discussion above  Screen HLD again today- no issues last year. Mild hyperlipidemia years past. cbg high in past- update cbg. Was ok last year  Lab Results  Component Value Date   CHOL 163 09/19/2017   HDL 50.40 09/19/2017   LDLCALC 96 09/19/2017   TRIG 84.0 09/19/2017   CHOLHDL 3 09/19/2017   Recommended follow up: 1 year [facility]  Lab/Order associations: not fasting  ICD-10-CM   1. Preventative health care  Z00.00 CBC    Comprehensive metabolic panel    Lipid panel    PSA    Vitamin B12  2. Screening for prostate cancer  Z12.5 PSA  3. Screening for hyperlipidemia  Z13.220 Lipid panel  4. Gastroesophageal reflux disease without esophagitis  K21.9 CBC    Comprehensive metabolic panel  5. High risk medication use  Z79.899 Vitamin B12  6. Overweight  E66.3   7. OSA (obstructive sleep apnea)  G47.33   8. Passive smoke exposure  Z77.22 POCT Urinalysis Dipstick (Automated)   Meds ordered this encounter  Medications  . omeprazole (PRILOSEC) 20 MG capsule    Sig: Take 1 capsule (20 mg total) by mouth daily.    Dispense:  90 capsule    Refill:  3  . sildenafil (VIAGRA) 100 MG tablet    Sig: Take 1 tablet (100 mg total) by mouth daily as needed for erectile dysfunction.    Dispense:  10 tablet    Refill:  11    Return precautions advised.  Garret Reddish, MD

## 2019-01-28 NOTE — Addendum Note (Signed)
Addended by: Francis Dowse T on: 01/28/2019 10:55 AM   Modules accepted: Orders

## 2019-01-28 NOTE — Patient Instructions (Addendum)
Health Maintenance Due  Topic Date Due  . INFLUENZA VACCINE - flu shot today 12/28/2018   Shingrix #1 today. Repeat injection in 2-5 months. Schedule a nurse visit for the 2nd injection before you leave today (at the check out desk)  Please stop by lab before you go If you do not have mychart- we will call you about results within 5 business days of [place] receiving them.  If you have mychart- we will send your results within 3 business days of [place] receiving them.  If abnormal or we want to clarify a result, we will call or mychart you to make sure you receive the message.  If you have questions or concerns or don't hear within 5-7 days, please send [place] a message or call us.

## 2019-01-28 NOTE — Addendum Note (Signed)
Addended by: Jasper Loser on: 01/28/2019 10:33 AM   Modules accepted: Orders

## 2019-02-14 ENCOUNTER — Encounter: Payer: 59 | Admitting: Family Medicine

## 2019-05-14 ENCOUNTER — Other Ambulatory Visit: Payer: Self-pay

## 2019-05-15 ENCOUNTER — Ambulatory Visit (INDEPENDENT_AMBULATORY_CARE_PROVIDER_SITE_OTHER): Payer: 59

## 2019-05-15 ENCOUNTER — Ambulatory Visit: Payer: 59

## 2019-05-15 DIAGNOSIS — Z23 Encounter for immunization: Secondary | ICD-10-CM | POA: Diagnosis not present

## 2019-05-15 NOTE — Progress Notes (Signed)
Per orders of Dr. Yong Channel, injection of Shingrix, 0.5 ml given left deltoid IM by Clearnce Sorrel Marixa Mellott, CMA Patient tolerated injection well.

## 2019-12-02 ENCOUNTER — Other Ambulatory Visit: Payer: Self-pay

## 2019-12-02 ENCOUNTER — Ambulatory Visit: Payer: 59 | Admitting: Family Medicine

## 2019-12-02 ENCOUNTER — Encounter: Payer: Self-pay | Admitting: Family Medicine

## 2019-12-02 VITALS — BP 124/84 | HR 94 | Temp 98.5°F | Ht 72.0 in | Wt 191.0 lb

## 2019-12-02 DIAGNOSIS — R350 Frequency of micturition: Secondary | ICD-10-CM | POA: Diagnosis not present

## 2019-12-02 DIAGNOSIS — R358 Other polyuria: Secondary | ICD-10-CM | POA: Diagnosis not present

## 2019-12-02 DIAGNOSIS — R3589 Other polyuria: Secondary | ICD-10-CM

## 2019-12-02 DIAGNOSIS — N401 Enlarged prostate with lower urinary tract symptoms: Secondary | ICD-10-CM | POA: Diagnosis not present

## 2019-12-02 DIAGNOSIS — K219 Gastro-esophageal reflux disease without esophagitis: Secondary | ICD-10-CM | POA: Diagnosis not present

## 2019-12-02 LAB — POC URINALSYSI DIPSTICK (AUTOMATED)
Bilirubin, UA: NEGATIVE
Blood, UA: NEGATIVE
Glucose, UA: NEGATIVE
Ketones, UA: NEGATIVE
Leukocytes, UA: NEGATIVE
Nitrite, UA: NEGATIVE
Protein, UA: NEGATIVE
Spec Grav, UA: 1.01 (ref 1.010–1.025)
Urobilinogen, UA: 0.2 E.U./dL
pH, UA: 6 (ref 5.0–8.0)

## 2019-12-02 MED ORDER — OMEPRAZOLE 40 MG PO CPDR: 40.0000 mg | DELAYED_RELEASE_CAPSULE | Freq: Every day | ORAL | 0 refills | Status: DC

## 2019-12-02 MED ORDER — ALFUZOSIN HCL ER 10 MG PO TB24: 10.0000 mg | ORAL_TABLET | Freq: Every day | ORAL | 5 refills | Status: DC

## 2019-12-02 NOTE — Patient Instructions (Addendum)
Health Maintenance Due  Topic Date Due  . COVID-19 Vaccine (1) has had will call with dates  Never done   Lets try alfuzosin daily to see if this helps with enlarged prostate issues.  We can check in at her physical.  Try to bring me a copy of labs from Dr. Robby Sermon before next physical  For near elbow - advised trial  compression sleeve and icing if area gets irritated. If not improving he will let me know and we can refer to sports medicine  Sent in 90 day supply of omeprazole 40mg . Lets try higher dose at least one month then can go back to 20mg - you will have several pills of 40mg  left if needed. Id like for you to record how often you have reflux on 40mg  dose over next month then back on 20mg  before next visit.  Consider GI referral if still having issues on 40mg  dose   Recommended follow up: Return for next already scheduled visit.  Alfuzosin extended-release tablets  What is this medicine? ALFUZOSIN (al FYOO zoe sin) is used to treat benign prostatic hyperplasia (BPH) in men. This is a condition that causes you to have an enlarged prostate. This medicine relaxes the muscles in the prostate and the bladder which reduces symptoms and improves urine flow. This medicine is not for use in women. This medicine may be used for other purposes; ask your health care provider or pharmacist if you have questions. COMMON BRAND NAME(S): Uroxatral What should I tell my health care provider before I take this medicine? They need to know if you have any of the following conditions:  heart disease  kidney disease  liver disease  low blood pressure  an unusual or allergic reaction to alfuzosin, other medicines, foods, dyes, or preservatives How should I use this medicine? Take this medicine by mouth with a glass of water. Follow the directions on the prescription label. Take this medicine after the same meal every day. Take this medicine with food. Do not take on an empty stomach. Swallow whole. Do  not cut, crush or chew this medicine. Take your doses at regular intervals. Do not take your medicine more often than directed. Do not stop taking except on the advice of your doctor or health care professional. Talk to your pediatrician regarding the use of this medicine in children. This medicine is not approved for use in children. Overdosage: If you think you have taken too much of this medicine contact a poison control center or emergency room at once. NOTE: This medicine is only for you. Do not share this medicine with others. What if I miss a dose? If you miss a dose, take it as soon as you can. If it is almost time for your next dose, take only that dose. Do not take double or extra doses. What may interact with this medicine? Do not take this medicine with any of the following medications:  another alpha blocker medicine, such as doxazosin, prazosin, silodosin, tamsulosin, terazosin  certain medicines for fungal infections like fluconazole, itraconazole, ketoconazole, posaconazole, voriconazole  cisapride  dronedarone  droperidol  pimozide  ritonavir  thioridazine This medicine may also interact with the following medications:  avanafil  cimetidine  certain medicines for chest pain or blood pressure  dofetilide  grapefruit juice  other medicines that prolong the QT interval (cause an abnormal heart rhythm)  sildenafil  tadalafil  vardenafil  ziprasidone This list may not describe all possible interactions. Give your health care provider  a list of all the medicines, herbs, non-prescription drugs, or dietary supplements you use. Also tell them if you smoke, drink alcohol, or use illegal drugs. Some items may interact with your medicine. What should I watch for while using this medicine? Visit your doctor or health care professional for regular checks on your progress. Check your blood pressure regularly. Ask your doctor or health care professional what your blood  pressure should be and when you should contact him or her. Drowsiness and dizziness are more likely to occur after the first dose, after an increase in dose, or during hot weather or exercise. These effects can decrease once your body adjusts to this medicine. Do not drive, use machinery, or do anything that needs mental alertness until you know how this drug affects you. Do not stand or sit up quickly, especially if you are an older patient. This reduces the risk of dizzy or fainting spells. Alcohol can make you more drowsy and dizzy. Avoid alcoholic drinks. Contact your doctor or health care professional right away if you have an erection that lasts longer than 4 hours or if it becomes painful. This may be a sign of a serious problem and must be treated right away to prevent permanent damage. What side effects may I notice from receiving this medicine? Side effects that you should report to your doctor or health care professional as soon as possible:  allergic reactions like skin rash, itching or hives, swelling of the face, lips, or tongue  breathing problems  chest pain  fast, irregular heartbeat  feeling faint or lightheaded, falls  prolonged or painful erection  redness, blistering, peeling or loosening of the skin, including inside the mouth  swelling of ankles or legs  unusually weak or tired  yellowing of eyes or skin Side effects that usually do not require medical attention (report to your doctor or health care professional if they continue or are bothersome):  dizziness  headache  tiredness This list may not describe all possible side effects. Call your doctor for medical advice about side effects. You may report side effects to FDA at 1-800-FDA-1088. Where should I keep my medicine? Keep out of the reach of children. Store at room temperature between 15 and 30 degrees C (59 and 86 degrees F). Protect from light and moisture. Throw away any unused medicine after the  expiration date. NOTE: This sheet is a summary. It may not cover all possible information. If you have questions about this medicine, talk to your doctor, pharmacist, or health care provider.  2020 Elsevier/Gold Standard (2018-04-17 11:22:39)

## 2019-12-02 NOTE — Progress Notes (Signed)
Phone [phone] In person visit   Subjective:   Lucas Rocha is a 54 y.o. year old very pleasant male patient who presents for/with See problem oriented charting Chief Complaint  Patient presents with  . Urinary Frequency    This visit occurred during the SARS-CoV-2 public health emergency.  Safety protocols were in place, including screening questions prior to the visit, additional usage of staff PPE, and extensive cleaning of exam room while observing appropriate contact time as indicated for disinfecting solutions.   Past Medical History-  Patient Active Problem List   Diagnosis Date Noted  . BPH (benign prostatic hyperplasia)     Priority: Medium  . G E R D 12/28/2006    Priority: Medium  . Erectile dysfunction 09/17/2017    Priority: Low  . OSA (obstructive sleep apnea)     Priority: Low  . Ureterolithiasis 02/07/2013    Priority: Low  . Gastric polyp 12/12/2011    Priority: Low  . Diverticulosis 12/07/2011    Priority: Low  . Hx of adenomatous colonic polyps 12/07/2011    Priority: Low  . Iron deficiency anemia 10/24/2011    Priority: Low  . Allergic rhinitis 01/01/2007    Priority: Low    Medications- reviewed and updated Current Outpatient Medications  Medication Sig Dispense Refill  . fexofenadine (ALLEGRA) 60 MG tablet Take 60 mg by mouth 2 (two) times daily.    . fluticasone (FLONASE) 50 MCG/ACT nasal spray Place 2 sprays into both nostrils daily. 48 g 3  . omeprazole (PRILOSEC) 40 MG capsule Take 1 capsule (40 mg total) by mouth daily. Trial of higher dose- may switch back to 20mg  dose when complete this course. 90 capsule 0  . sildenafil (VIAGRA) 100 MG tablet Take 1 tablet (100 mg total) by mouth daily as needed for erectile dysfunction. 10 tablet 11  . alfuzosin (UROXATRAL) 10 MG 24 hr tablet Take 1 tablet (10 mg total) by mouth daily with breakfast. 30 tablet 5   No current facility-administered medications for this visit.     Objective:  BP  124/84   Pulse 94   Temp 98.5 F (36.9 C) (Temporal)   Ht 6' (1.829 m)   Wt 191 lb (86.6 kg)   SpO2 94%   BMI 25.90 kg/m  Gen: NAD, resting comfortably CV: RRR no murmurs rubs or gallops Lungs: CTAB no crackles, wheeze, rhonchi Abdomen: soft/nontender/nondistended/normal bowel sounds. No rebound or guarding.  Ext: no edema Skin: warm, dry Rectal: normal tone, diffusely enlarged prostate, no masses or tenderness   Urine still pending  No results found for this or any previous visit (from the past 24 hour(s)).     Assessment and Plan   # BPH with Frequent urination S:Has been going on for over a year. Daytime urination is more frequent than it used to be and notes urgency. No dysuria. Mellow yellow once a day could act as slight diuretic and he also tries to stay well hydrated. Nocturia about once a night-  had decreased after starting to use Cpap. He thinks that it may be more of a prostate issue than an infection. Has had recent PSA done at work.  Lab Results  Component Value Date   PSA 0.41 01/28/2019   PSA 0.39 09/19/2017   PSA 0.60 01/19/2016  A/P:  BPH per history and exam. Patient wants to trial medication. Sulfa allergy so avoid flomax. Rapaflo appears constly. Try alfuzosin.  - will get UA and culture but doubt infectious  #  Right lateral epicondylitis? noted slightly more distal than I would expect advised trial  compression sleeve and icing if area gets irritated. If not improving he will let me know and we can refer to sports medicine  # GERD S:omeprazole 20 mg is pretty effective. With certain foods or eating late it can still trigger his symptoms.  Not sure how often.  B12 levels related to PPI use: Lab Results  Component Value Date   VITAMINB12 315 01/28/2019   A/P: poor control Sent in 90 day supply of omeprazole 40mg . Lets try higher dose at least one month then can go back to 20mg - you will have several pills of 40mg  left if needed. Id like for you to  record how often you have reflux on 40mg  dose over next month then back on 20mg  before next visit . Consider GI referral if still having issues on 40mg  dose   Recommended follow up: Return for next already scheduled visit. Future Appointments  Date Time Provider [place]  02/10/2020  9:20 AM Marin Olp, MD [facility]    Lab/Order associations:   ICD-10-CM   1. Benign prostatic hyperplasia with urinary frequency  N40.1    R35.0   2. Gastroesophageal reflux disease without esophagitis  K21.9   3. Polyuria  R35.8 POCT Urinalysis Dipstick (Automated)    Urine Culture    Meds ordered this encounter  Medications  . alfuzosin (UROXATRAL) 10 MG 24 hr tablet    Sig: Take 1 tablet (10 mg total) by mouth daily with breakfast.    Dispense:  30 tablet    Refill:  5  . omeprazole (PRILOSEC) 40 MG capsule    Sig: Take 1 capsule (40 mg total) by mouth daily. Trial of higher dose- may switch back to 20mg  dose when complete this course.    Dispense:  90 capsule    Refill:  0   Return precautions advised.  Garret Reddish, MD

## 2019-12-03 LAB — URINE CULTURE
MICRO NUMBER:: 10670296
Result:: NO GROWTH
SPECIMEN QUALITY:: ADEQUATE

## 2020-01-26 ENCOUNTER — Telehealth: Payer: Self-pay | Admitting: Family Medicine

## 2020-01-26 MED ORDER — ALFUZOSIN HCL ER 10 MG PO TB24: 10.0000 mg | ORAL_TABLET | Freq: Every day | ORAL | 0 refills | Status: DC

## 2020-01-26 MED ORDER — ALFUZOSIN HCL ER 10 MG PO TB24: 10.0000 mg | ORAL_TABLET | Freq: Every day | ORAL | 2 refills | Status: DC

## 2020-01-26 NOTE — Telephone Encounter (Signed)
  LAST APPOINTMENT DATE: 12/02/2019   NEXT APPOINTMENT DATE:@9 /14/2021  MEDICATION:alfuzosin (UROXATRAL) 10 MG 24 hr tablet  PHARMACY: [facility] [place], [place] - [address] AT [address]  Comments: Patient states he is going out of town asked for it a short supply to be sent here

## 2020-01-26 NOTE — Telephone Encounter (Signed)
Rx sent to mail order

## 2020-01-26 NOTE — Telephone Encounter (Signed)
  LAST APPOINTMENT DATE: 01/26/2020   NEXT APPOINTMENT DATE:@9 /14/2021  MEDICATION: alfuzosin (UROXATRAL) 10 MG 24 hr tablet  PHARMACY: [facility], [place], [address]  COMMENTS: Patient asked for a 90 day supply to be sent here

## 2020-01-26 NOTE — Telephone Encounter (Signed)
Rx sent to local pharmacy.  

## 2020-02-07 ENCOUNTER — Other Ambulatory Visit: Payer: Self-pay | Admitting: Family Medicine

## 2020-02-09 NOTE — Progress Notes (Signed)
Phone: [phone]    Subjective:  Patient presents today for their annual physical. Chief complaint-noted.   See problem oriented charting- Review of Systems  Constitutional: Negative for chills and fever.  HENT: Negative for congestion, hearing loss and sore throat.   Eyes: Positive for blurred vision (wears readers). Negative for double vision.  Respiratory: Negative for cough and shortness of breath.   Cardiovascular: Negative for chest pain and palpitations.  Gastrointestinal: Negative for diarrhea, heartburn, nausea and vomiting.  Genitourinary: Negative for dysuria and frequency.  Musculoskeletal: Negative for back pain and myalgias.  Skin: Negative for itching and rash.  Neurological: Negative for dizziness and headaches.  Endo/Heme/Allergies: Negative for polydipsia. Bruises/bleeds easily (bruises easily. very active with 2nd job).  Psychiatric/Behavioral: Negative for depression, substance abuse and suicidal ideas.   The following were reviewed and entered/updated in epic: Past Medical History:  Diagnosis Date  . Allergic rhinitis   . Allergy   . Anemia   . BPH (benign prostatic hyperplasia)    some nocturia  . GERD (gastroesophageal reflux disease)   . History of iron deficiency anemia   . Lower urinary tract symptoms (LUTS)   . OSA (obstructive sleep apnea)    oral appliance  . Right ureteral stone   . Sleep apnea    cpap   Patient Active Problem List   Diagnosis Date Noted  . BPH (benign prostatic hyperplasia)     Priority: Medium  . G E R D 12/28/2006    Priority: Medium  . Erectile dysfunction 09/17/2017    Priority: Low  . OSA (obstructive sleep apnea)     Priority: Low  . Ureterolithiasis 02/07/2013    Priority: Low  . Gastric polyp 12/12/2011    Priority: Low  . Diverticulosis 12/07/2011    Priority: Low  . Hx of adenomatous colonic polyps 12/07/2011    Priority: Low  . Iron deficiency anemia 10/24/2011    Priority: Low  . Allergic  rhinitis 01/01/2007    Priority: Low   Past Surgical History:  Procedure Laterality Date  . COLONOSCOPY    . CYSTOSCOPY WITH RETROGRADE PYELOGRAM, URETEROSCOPY AND STENT PLACEMENT Right 05/14/2013   Procedure: CYSTOSCOPY WITH RETROGRADE PYELOGRAM, URETEROSCOPY AND STENT PLACEMENT;  Surgeon: Alexis Frock, MD;  Location: [facility];  Service: Urology;  Laterality: Right;  . EXTRACORPOREAL SHOCK WAVE LITHOTRIPSY Right 02-13-2013  . FOOT GANGLION EXCISION Left 1999  . HOLMIUM LASER APPLICATION Right 03/22/8526   Procedure: HOLMIUM LASER APPLICATION;  Surgeon: Alexis Frock, MD;  Location: [facility];  Service: Urology;  Laterality: Right;  . POLYPECTOMY      Family History  Problem Relation Age of Onset  . Arthritis Mother   . Cancer Father        unknown- not worked up 14  . Diabetes Maternal Grandfather   . Heart disease Other        Dad's side - grandfather, grandmother (big MI)  . Colon cancer Neg Hx   . Colon polyps Neg Hx   . Esophageal cancer Neg Hx   . Stomach cancer Neg Hx   . Rectal cancer Neg Hx     Medications- reviewed and updated Current Outpatient Medications  Medication Sig Dispense Refill  . alfuzosin (UROXATRAL) 10 MG 24 hr tablet Take 1 tablet (10 mg total) by mouth daily with breakfast. 90 tablet 2  . fexofenadine (ALLEGRA) 60 MG tablet Take 60 mg by mouth as needed.     . fluticasone (FLONASE) 50 MCG/ACT  nasal spray Place 2 sprays into both nostrils daily. (Patient taking differently: Place 2 sprays into both nostrils as needed. ) 48 g 3  . omeprazole (PRILOSEC) 40 MG capsule TAKE 1 CAPSULE BY MOUTH  DAILY . TRIAL OF HIGHER  DOSE- MAY SWITCH BACK TO 20 MG DOSE WHEN COMPLETE THIS  COURSE 90 capsule 3  . sildenafil (VIAGRA) 100 MG tablet Take 1 tablet (100 mg total) by mouth daily as needed for erectile dysfunction. 10 tablet 11   No current facility-administered medications for this visit.    Allergies-reviewed and  updated Allergies  Allergen Reactions  . Penicillins Hives     AND HYPERACTIVITY  . Sulfa Antibiotics Hives    CHILDHOOD REACTION    Social History   Social History Narrative   Married. 2 daughters. 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 Oropharynx normal Neck: no thyromegaly CV: RRR no murmurs rubs or gallops Lungs: CTAB no crackles, wheeze, rhonchi Abdomen: soft/nontender/nondistended/normal bowel sounds. No rebound or guarding.  Ext: no edema Skin: warm, dry Neuro: grossly normal, moves all extremities, PERRLA     Assessment and Plan:  54 y.o. male presenting for annual physical.  Health Maintenance counseling: 1. Anticipatory guidance: Patient counseled regarding regular dental exams q6 months, eye exams- yearly with Dr. Ellie Lunch readers only,  avoiding smoking and second hand smoke, limiting alcohol to 2 beverages per day.   2. Risk factor reduction:  Advised patient of need for regular exercise and diet rich and fruits and vegetables to reduce risk of heart attack and stroke. Exercise- very active with work and walking helps him. Diet-weight largely stable from last year- up just 3 lbs. Reasonably healthy - could fine tune- does want to drink more water.  Wt Readings from Last 3 Encounters:  02/10/20 196 lb 6.4 oz (89.1 kg)  12/02/19 191 lb (86.6 kg)  01/28/19 188 lb 3.2 oz (85.4 kg)  3. Immunizations/screenings/ancillary studies- will get flu shot with the county.  Already had COVID-19 vaccinations-she will send [place] the dates-I believe moderna Immunization History  Administered Date(s) Administered  . Influenza Split  02/26/2013  . Influenza,inj,Quad PF,6+ Mos 01/28/2019  . Influenza-Unspecified 02/16/2016, 01/27/2018  . Tdap 10/24/2011  . Zoster Recombinat (Shingrix) 01/28/2019, 05/15/2019  4. Prostate cancer screening-some BPH in the past.  Nocturia 4 times a night last year but improved with CPAP.  Has seen Dr. Bess Harvest in the past. Defer rectal- Dr. Robby Sermon always does this Lab Results  Component Value Date   PSA 0.41 01/28/2019   PSA 0.39 09/19/2017   PSA 0.60 01/19/2016   5. Colon cancer screening - Next due on 05/24/2028.  Patient had another colonoscopy 05/24/2018 that was clean so spaced out to 10 years by Dr. Joanell Rising serrated adenoma in 2013 6. Skin cancer screening/prevention-last year was planning on seeing new dermatologist potentially [facility] dermatology (yearly planned)- had a spot removed and had to have larger area removed- has healed up. advised regular sunscreen use. Denies worrisome, changing, or new skin lesions.  7.  STD screening- patient opts out as monogamous  8.  Never smoker-   Status of chronic or acute concerns   Allergic rhinitis-taking Allegra and Flonase as needed. Flare up in [place] for vacatoin  GERD-taking omeprazole 20 mg- had to do short term 40mg  but thad did help.  Failed H2 blocker in the past.  Low normal B12 last year-recommended at least multivitamin (not taking)-check again today Lab Results  Component Value Date   VITAMINB12 315 01/28/2019    Erectile dysfunction-sildenafil 100 mg works well  BPH- alfuzosin trial last July 2021 and found very helpful. Daytime frequency improved. Nocturia still once a night. See above. Reasonable control- continue current meds  Mild hyperlipidemia in the past-LDL above 70 last year.  Update lipid panel and calculate ASCVD risk.  10-year ASCVD risk 3.1% as of last year so not in range above 7.5% to consider statin The 10-year ASCVD risk score Mikey Bussing DC Brooke Bonito., et al., 2013) is: 3.1%   Values used to calculate the score:      Age: 75 years     Sex: Male     Is Non-Hispanic African American: No     Diabetic: No     Tobacco smoker: No     Systolic Blood Pressure: 340 mmHg     Is BP treated: No     HDL Cholesterol: 46.7 mg/dL     Total Cholesterol: 160 mg/dL    Recommended follow up: 1 year physical No future appointments.  Chief Complaint  Patient presents with  . Annual Exam   Lab/Order associations:Non fasting   ICD-10-CM   1. Preventative health care  B52.48 COMPLETE METABOLIC PANEL WITH GFR    CBC    Lipid panel    PSA    POCT Urinalysis Dipstick (Automated)    Vitamin B12  2. Gastroesophageal reflux disease without esophagitis  L85.9 COMPLETE METABOLIC PANEL WITH GFR    CBC  3. Screening for prostate cancer  Z12.5 PSA  4. History of hyperlipidemia  Z86.39 Lipid panel    POCT Urinalysis Dipstick (Automated)  5. High risk medication use  Z79.899 Vitamin B12    No orders of the defined types were placed in this encounter.   Return precautions advised.   Garret Reddish, MD

## 2020-02-09 NOTE — Patient Instructions (Addendum)
Please stop by lab before you go If you have mychart- we will send your results within 3 business days of [place] receiving them.  If you do not have mychart- we will call you about results within 5 business days of [place] receiving them.  *please note we are currently using [facility] which has a longer processing time than [place] typically so labs may not come back as quickly as in the past *please also note that you will see labs on mychart as soon as they post. I will later go in and write notes on them- will say "notes from Dr. Yong Channel"  Team urine in lab to be processed  Health Maintenance Due  Topic Date Due  . COVID-19 Vaccine (1) Will call back with dates Never done  . INFLUENZA VACCINE Will get flu shot from the county. Please update [place] on mychart once you have gotten this 12/28/2019

## 2020-02-10 ENCOUNTER — Ambulatory Visit (INDEPENDENT_AMBULATORY_CARE_PROVIDER_SITE_OTHER): Payer: 59 | Admitting: Family Medicine

## 2020-02-10 ENCOUNTER — Other Ambulatory Visit: Payer: Self-pay

## 2020-02-10 ENCOUNTER — Encounter: Payer: Self-pay | Admitting: Family Medicine

## 2020-02-10 VITALS — BP 122/80 | HR 87 | Temp 98.3°F | Resp 18 | Ht 72.0 in | Wt 196.4 lb

## 2020-02-10 DIAGNOSIS — Z8639 Personal history of other endocrine, nutritional and metabolic disease: Secondary | ICD-10-CM

## 2020-02-10 DIAGNOSIS — Z125 Encounter for screening for malignant neoplasm of prostate: Secondary | ICD-10-CM

## 2020-02-10 DIAGNOSIS — K219 Gastro-esophageal reflux disease without esophagitis: Secondary | ICD-10-CM

## 2020-02-10 DIAGNOSIS — Z0000 Encounter for general adult medical examination without abnormal findings: Secondary | ICD-10-CM

## 2020-02-10 DIAGNOSIS — Z79899 Other long term (current) drug therapy: Secondary | ICD-10-CM

## 2020-02-10 LAB — COMPLETE METABOLIC PANEL WITH GFR
AG Ratio: 1.5 (calc) (ref 1.0–2.5)
ALT: 22 U/L (ref 9–46)
AST: 20 U/L (ref 10–35)
Albumin: 4.1 g/dL (ref 3.6–5.1)
Alkaline phosphatase (APISO): 79 U/L (ref 35–144)
BUN: 11 mg/dL (ref 7–25)
CO2: 30 mmol/L (ref 20–32)
Calcium: 9.5 mg/dL (ref 8.6–10.3)
Chloride: 104 mmol/L (ref 98–110)
Creat: 1.04 mg/dL (ref 0.70–1.33)
GFR, Est African American: 95 mL/min/{1.73_m2} (ref >=60)
GFR, Est Non African American: 82 mL/min/{1.73_m2} (ref >=60)
Globulin: 2.7 g/dL (calc) (ref 1.9–3.7)
Glucose, Bld: 90 mg/dL (ref 65–99)
Potassium: 4.2 mmol/L (ref 3.5–5.3)
Sodium: 140 mmol/L (ref 135–146)
Total Bilirubin: 0.8 mg/dL (ref 0.2–1.2)
Total Protein: 6.8 g/dL (ref 6.1–8.1)

## 2020-02-10 LAB — POC URINALSYSI DIPSTICK (AUTOMATED)
Bilirubin, UA: NEGATIVE
Blood, UA: NEGATIVE
Glucose, UA: NEGATIVE
Ketones, UA: NEGATIVE
Leukocytes, UA: NEGATIVE
Nitrite, UA: NEGATIVE
Protein, UA: NEGATIVE
Spec Grav, UA: 1.01 (ref 1.010–1.025)
Urobilinogen, UA: 0.2 E.U./dL
pH, UA: 6.5 (ref 5.0–8.0)

## 2020-02-10 LAB — CBC
HCT: 45.6 % (ref 38.5–50.0)
Hemoglobin: 15.3 g/dL (ref 13.2–17.1)
MCH: 30.9 pg (ref 27.0–33.0)
MCHC: 33.6 g/dL (ref 32.0–36.0)
MCV: 92.1 fL (ref 80.0–100.0)
MPV: 10.7 fL (ref 7.5–12.5)
Platelets: 237 10*3/uL (ref 140–400)
RBC: 4.95 10*6/uL (ref 4.20–5.80)
RDW: 11.9 % (ref 11.0–15.0)
WBC: 4.7 10*3/uL (ref 3.8–10.8)

## 2020-02-10 LAB — PSA: PSA: 0.34 ng/mL (ref <=4.0)

## 2020-02-10 LAB — LIPID PANEL
Cholesterol: 174 mg/dL (ref <200)
HDL: 54 mg/dL (ref >=40)
LDL Cholesterol (Calc): 101 mg/dL (calc) — ABNORMAL HIGH
Non-HDL Cholesterol (Calc): 120 mg/dL (calc) (ref <130)
Total CHOL/HDL Ratio: 3.2 (calc) (ref <5.0)
Triglycerides: 95 mg/dL (ref <150)

## 2020-02-10 LAB — VITAMIN B12: Vitamin B-12: 346 pg/mL (ref 200–1100)

## 2020-02-10 NOTE — Addendum Note (Signed)
Addended by: Thomes Cake on: 02/10/2020 10:04 AM   Modules accepted: Orders

## 2020-04-12 ENCOUNTER — Telehealth: Payer: Self-pay

## 2020-04-12 NOTE — Telephone Encounter (Signed)
Patient called in and said he received a message that his omeprazole (PRILOSEC) 40 MG capsule was cancelled by [facility] since he needs a prior auth for the medication. Patient would like a call back when the PA has been completed.

## 2020-04-12 NOTE — Telephone Encounter (Signed)
Can you follow up with pt when this has been done please?

## 2020-04-13 MED ORDER — OMEPRAZOLE 40 MG PO CPDR: DELAYED_RELEASE_CAPSULE | ORAL | 3 refills | Status: DC

## 2020-04-13 MED ORDER — OMEPRAZOLE 40 MG PO CPDR: DELAYED_RELEASE_CAPSULE | ORAL | 0 refills | Status: DC

## 2020-04-13 NOTE — Telephone Encounter (Signed)
Spoke with the the patient and he was very appreciative for the call. He would like to know is there anyway possible we could send a small prescription to [facility] to hold him over until the medication arrives from [facility]. Please advise

## 2020-04-13 NOTE — Addendum Note (Signed)
Addended by: Thomes Cake on: 04/13/2020 02:30 PM   Modules accepted: Orders

## 2020-04-13 NOTE — Telephone Encounter (Signed)
Pt called following up on this. Pt states he only has 2 pills left. Pt is asking for a call back.

## 2020-04-13 NOTE — Telephone Encounter (Signed)
Yes thanks-you may send a 30-day supply to local pharmacy

## 2020-04-13 NOTE — Telephone Encounter (Signed)
This medication or product is on your plan's list of covered drugs. Prior authorization is not required at this time. If your pharmacy has questions regarding the processing of your prescription, please have them call the [facility] pharmacy help desk at ([phone]. **Please note: Formulary lowering, tiering exception, cost reduction and/or pre-benefit determination review (including prospective Medicare hospice reviews) requests cannot be requested using this method of submission.

## 2020-04-16 NOTE — Telephone Encounter (Signed)
Patient called in and stated he spoke to [facility] and they told him that he needed a PA. Patient would like this to be handled as soon as possible.

## 2020-04-16 NOTE — Telephone Encounter (Signed)
Can you work on this please?

## 2020-04-19 NOTE — Telephone Encounter (Signed)
PA was initiated on cover my meds. The determination is listed below.   This medication or product is on your plan's list of covered drugs. Prior authorization is not required at this time. If your pharmacy has questions regarding the processing of your prescription, please have them call the [facility] pharmacy help desk at ([phone]. **Please note: Formulary lowering, tiering exception, cost reduction and/or pre-benefit determination review (including prospective Medicare hospice reviews) requests cannot be requested using this method of submission. Please contact us at [phone] instead.

## 2020-05-11 ENCOUNTER — Telehealth: Payer: Self-pay

## 2020-05-11 NOTE — Telephone Encounter (Signed)
Pt came in the office a bout his Omeprazole medication problem that he was having. Pt advised me that the pharmacy wouldn't refill his medication because the instructions on the script wasn't clear. So I called the pharmacy and clarified that he should be taking 20mg  of omeprazole daily. The pharmacist gave me a verbal understanding stated that they would get his script filled and mailed to him asap. I filled the medication for a year supply. Lucas Rocha is aware of his dosage and that I did get the issue solved, He gave a verbal understanding also.

## 2020-09-14 ENCOUNTER — Other Ambulatory Visit: Payer: Self-pay | Admitting: Family Medicine

## 2021-02-09 NOTE — Progress Notes (Signed)
Phone: [phone]   Subjective:  Patient presents today for their annual physical. Chief complaint-noted.   See problem oriented charting- ROS- full  review of systems was completed and negative  except for: congestion, sinus pressure, eye itching (drops in eyes), light sensitivity, seasonal allergies -recently treated with azithromycin for sinus infection-reports improving  The following were reviewed and entered/updated in epic: Past Medical History:  Diagnosis Date   Allergic rhinitis    Allergy    Anemia    BPH (benign prostatic hyperplasia)    some nocturia   GERD (gastroesophageal reflux disease)    History of iron deficiency anemia    Lower urinary tract symptoms (LUTS)    OSA (obstructive sleep apnea)    oral appliance   Right ureteral stone    Sleep apnea    cpap   Patient Active Problem List   Diagnosis Date Noted   BPH (benign prostatic hyperplasia)     Priority: Medium   G E R D 12/28/2006    Priority: Medium   Erectile dysfunction 09/17/2017    Priority: Low   OSA (obstructive sleep apnea)     Priority: Low   Ureterolithiasis 02/07/2013    Priority: Low   Gastric polyp 12/12/2011    Priority: Low   Diverticulosis 12/07/2011    Priority: Low   Hx of adenomatous colonic polyps 12/07/2011    Priority: Low   Iron deficiency anemia 10/24/2011    Priority: Low   Allergic rhinitis 01/01/2007    Priority: Low   Past Surgical History:  Procedure Laterality Date   COLONOSCOPY     CYSTOSCOPY WITH RETROGRADE PYELOGRAM, URETEROSCOPY AND STENT PLACEMENT Right 05/14/2013   Procedure: CYSTOSCOPY WITH RETROGRADE PYELOGRAM, URETEROSCOPY AND STENT PLACEMENT;  Surgeon: Alexis Frock, MD;  Location: [facility];  Service: Urology;  Laterality: Right;   EXTRACORPOREAL SHOCK WAVE LITHOTRIPSY Right 02-13-2013   FOOT GANGLION EXCISION Left 1999   HOLMIUM LASER APPLICATION Right [id number]   Procedure: HOLMIUM LASER APPLICATION;  Surgeon: Alexis Frock, MD;  Location[facility];  Service: Urology;  Laterality: Right;   POLYPECTOMY      Family History  Problem Relation Age of Onset   Arthritis Mother    Cancer Father        unknown- not worked up 76   Diabetes Maternal Grandfather    Heart disease Other        Dad's side - grandfather, grandmother (big MI)   Colon cancer Neg Hx    Colon polyps Neg Hx    Esophageal cancer Neg Hx    Stomach cancer Neg Hx    Rectal cancer Neg Hx     Medications- reviewed and updated Current Outpatient Medications  Medication Sig Dispense Refill   alfuzosin (UROXATRAL) 10 MG 24 hr tablet TAKE 1 TABLET BY MOUTH  DAILY WITH BREAKFAST 90 tablet 3   fexofenadine (ALLEGRA) 60 MG tablet Take 60 mg by mouth as needed.      fluticasone (FLONASE) 50 MCG/ACT nasal spray Place 2 sprays into both nostrils daily. (Patient taking differently: Place 2 sprays into both nostrils as needed.) 48 g 3   omeprazole (PRILOSEC) 40 MG capsule TAKE 1 CAPSULE BY MOUTH  DAILY . TRIAL OF HIGHER  DOSE- MAY SWITCH BACK TO 20 MG DOSE WHEN COMPLETE THIS  COURSE 30 capsule 0   sildenafil (VIAGRA) 100 MG tablet Take 1 tablet (100 mg total) by mouth daily as needed for erectile dysfunction. 10 tablet 11  No current facility-administered medications for this visit.    Allergies-reviewed and updated Allergies  Allergen Reactions   Penicillins Hives     AND HYPERACTIVITY   Sulfa Antibiotics Hives    CHILDHOOD REACTION    Social History   Social History Narrative   Married. 2 daughters. 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 Oropharynx normal Neck: no  thyromegaly CV: RRR no murmurs rubs or gallops Lungs: CTAB no crackles, wheeze, rhonchi Abdomen: soft/nontender/nondistended/normal bowel sounds. No rebound or guarding.  Ext: no edema Skin: warm, dry Neuro: grossly normal, moves all extremities, PERRLA    Assessment and Plan  55 y.o. male presenting for annual physical.  Health Maintenance counseling: 1. Anticipatory guidance: Patient counseled regarding regular dental exams -q6 months, eye exams -yearly with Dr. Ellie Lunch- readers only no adjustments,  avoiding smoking and second hand smoke , limiting alcohol to 2 beverages per day , no illicit drugs  2. Risk factor reduction:  Advised patient of need for regular exercise and diet rich and fruits and vegetables to reduce risk of heart attack and stroke. Exercise- trying to get some walking in - in addition to work- still a struggle with schedule. Diet-weight down 4 lbs from last year- reasonably healthy.  Wt Readings from Last 3 Encounters:  02/10/21 192 lb 12.8 oz (87.5 kg)  02/10/20 196 lb 6.4 oz (89.1 kg)  12/02/19 191 lb (86.6 kg)  3. Immunizations/screenings/ancillary studies- recommended omicron booster plus will cal with dates of other vaccines-  , discussed does not need prevnar until 33 , flu shot through county - will let us know date Immunization History  Administered Date(s) Administered   Influenza Split 02/26/2013   Influenza,inj,Quad PF,6+ Mos 01/28/2019   Influenza-Unspecified 02/16/2016, 01/27/2018   Tdap 10/24/2011   Zoster Recombinat (Shingrix) 01/28/2019, 05/15/2019  4. Prostate cancer screening-low risk prior PSA trend-continue to trend due to high risk employment history Lab Results  Component Value Date   PSA 0.34 02/10/2020   PSA 0.41 01/28/2019   PSA 0.39 09/19/2017   5. Colon cancer screening -  next due 05/24/2028- most recent 05/24/2018- serrated adenoma in 2013 but clean on last check 6. Skin cancer screening- saw dermatology last year and reports - has  not seen recently- will to call to schedule 7. Never smoker 8. STD screening - Opts out as monogamous  Status of chronic or acute concerns   # Memory Loss S:patient reports having issues for last year.  may forget what he had for lunch more than normal or if someone tells him to do something he will forget if doesn't write it down.  Does have a lot on his plate though- self described workaholic A/P: mini cog today normal- normal clock draw and 1/3 recall.  I strongly suspect patient is overstretched by the amount he does any today and this is likely contributing to his memory loss.  Even though initial testing is normal though I asked him if he has worsening symptoms to schedule a visit prior to his 1 year visit  and we can do further evaluation. -will check b12 due to high risk med use history (PPI) and tsh with HLD - does not need rpr/hiv testing -declines this  # left sided chest pain S:patient has noted some left lateral chest pain for 2-3 months. Comes and goes maybe a few times a week. Never exertional. Pain can be up to 3-4/10 - feels like if could get through rib cage pain would improve- pushes on it but does not improve. Never had pain like this before. No shortness of breath, left arm or neck pain, diaphoresis with episodes. No palpitatoins. Still on omeprazole.   No first degree relatives with CAD history- did have a grandfather and grandmother on dad's side with CAD  A/P: 55 year old male with nonexertional atypical chest pain-does not sound cardiac.  We discussed possibly doing chest x-ray he would like to hold off for now unless has worsening symptoms.  We discussed cardiology referral-he would like to hold off for now.  Declines EKG for now.  See hyperlipidemia section  Allergic rhinitis-taking Allegra and Flonase as needed.    GERD-taking omeprazole 20 mg- had to do short term '40mg'$   but thad did help- back on 20 mg.  Failed H2 blocker in the past.  Low normal B12- have recommended  MV - he is taking this and B12 Lab Results  Component Value Date   VITAMINB12 346 02/10/2020   Erectile dysfunction-sildenafil 100 mg works well when has chance- no recent issues   BPH- alfuzosin trial last July 2021 and found very helpful. Daytime frequency improved. Nocturia still once a night. See above. Reasonable control- continue current meds    Mild hyperlipidemia in the past-LDL above 70 last year.  Update lipid panel and calculate ASCVD risk.  10-year ASCVD risk 3.1% as of last year so not in range above 7.5% to consider statin- this year based on last years labs at 4.2%- will recalculate after obtaining lipids Lab Results  Component Value Date   CHOL 174 02/10/2020   HDL 54 02/10/2020   LDLCALC 101 (H) 02/10/2020   TRIG 95 02/10/2020   CHOLHDL 3.2 02/10/2020   Recommended follow up: Return in about 1 year (around 02/10/2022) for physical or sooner if needed.   Lab/Order associations:NOT  fasting   ICD-10-CM   1. Preventative health care  Z00.00 CBC with Differential/Platelet    Comprehensive metabolic panel    Lipid panel    PSA    POCT Urinalysis Dipstick (Automated)    Vitamin B12    TSH    2. Hyperlipidemia, unspecified hyperlipidemia type  E78.5 Lipid panel    TSH    CT CARDIAC SCORING (SELF PAY ONLY)    3. Iron deficiency anemia, unspecified iron deficiency anemia type  D50.9 CBC with Differential/Platelet    4. Screening for prostate cancer  Z12.5 PSA    5. Polyuria  R35.89 Comprehensive metabolic panel    POCT Urinalysis Dipstick (Automated)    6. High risk medication use  Z79.899 Vitamin B12      No orders of the defined types were placed in this encounter.   Return precautions advised.  Garret Reddish, MD

## 2021-02-10 ENCOUNTER — Encounter: Payer: Self-pay | Admitting: Family Medicine

## 2021-02-10 ENCOUNTER — Other Ambulatory Visit: Payer: Self-pay

## 2021-02-10 ENCOUNTER — Ambulatory Visit (INDEPENDENT_AMBULATORY_CARE_PROVIDER_SITE_OTHER): Payer: 59 | Admitting: Family Medicine

## 2021-02-10 VITALS — BP 139/87 | HR 101 | Temp 98.6°F | Ht 72.0 in | Wt 192.8 lb

## 2021-02-10 DIAGNOSIS — Z0000 Encounter for general adult medical examination without abnormal findings: Secondary | ICD-10-CM

## 2021-02-10 DIAGNOSIS — D509 Iron deficiency anemia, unspecified: Secondary | ICD-10-CM

## 2021-02-10 DIAGNOSIS — E785 Hyperlipidemia, unspecified: Secondary | ICD-10-CM

## 2021-02-10 DIAGNOSIS — R3589 Other polyuria: Secondary | ICD-10-CM

## 2021-02-10 DIAGNOSIS — Z79899 Other long term (current) drug therapy: Secondary | ICD-10-CM | POA: Diagnosis not present

## 2021-02-10 DIAGNOSIS — Z125 Encounter for screening for malignant neoplasm of prostate: Secondary | ICD-10-CM | POA: Diagnosis not present

## 2021-02-10 LAB — CBC WITH DIFFERENTIAL/PLATELET
Basophils Absolute: 0 10*3/uL (ref 0.0–0.1)
Basophils Relative: 0.6 % (ref 0.0–3.0)
Eosinophils Absolute: 0.2 10*3/uL (ref 0.0–0.7)
Eosinophils Relative: 3.3 % (ref 0.0–5.0)
HCT: 45.1 % (ref 39.0–52.0)
Hemoglobin: 15.1 g/dL (ref 13.0–17.0)
Lymphocytes Relative: 31.2 % (ref 12.0–46.0)
Lymphs Abs: 1.5 10*3/uL (ref 0.7–4.0)
MCHC: 33.5 g/dL (ref 30.0–36.0)
MCV: 92.5 fl (ref 78.0–100.0)
Monocytes Absolute: 0.4 10*3/uL (ref 0.1–1.0)
Monocytes Relative: 9.2 % (ref 3.0–12.0)
Neutro Abs: 2.7 10*3/uL (ref 1.4–7.7)
Neutrophils Relative %: 55.7 % (ref 43.0–77.0)
Platelets: 280 10*3/uL (ref 150.0–400.0)
RBC: 4.88 Mil/uL (ref 4.22–5.81)
RDW: 13 % (ref 11.5–15.5)
WBC: 4.8 10*3/uL (ref 4.0–10.5)

## 2021-02-10 LAB — LIPID PANEL
Cholesterol: 171 mg/dL (ref 0–200)
HDL: 50.9 mg/dL (ref >39.00)
LDL Cholesterol: 100 mg/dL — ABNORMAL HIGH (ref 0–99)
NonHDL: 120.39
Total CHOL/HDL Ratio: 3
Triglycerides: 102 mg/dL (ref 0.0–149.0)
VLDL: 20.4 mg/dL (ref 0.0–40.0)

## 2021-02-10 LAB — COMPREHENSIVE METABOLIC PANEL
ALT: 29 U/L (ref 0–53)
AST: 20 U/L (ref 0–37)
Albumin: 4.2 g/dL (ref 3.5–5.2)
Alkaline Phosphatase: 102 U/L (ref 39–117)
BUN: 11 mg/dL (ref 6–23)
CO2: 32 mEq/L (ref 19–32)
Calcium: 9.7 mg/dL (ref 8.4–10.5)
Chloride: 103 mEq/L (ref 96–112)
Creatinine, Ser: 0.95 mg/dL (ref 0.40–1.50)
GFR: 91.56 mL/min (ref >60.00)
Glucose, Bld: 73 mg/dL (ref 70–99)
Potassium: 4.3 mEq/L (ref 3.5–5.1)
Sodium: 140 mEq/L (ref 135–145)
Total Bilirubin: 0.5 mg/dL (ref 0.2–1.2)
Total Protein: 7 g/dL (ref 6.0–8.3)

## 2021-02-10 LAB — POC URINALSYSI DIPSTICK (AUTOMATED)
Bilirubin, UA: NEGATIVE
Blood, UA: NEGATIVE
Glucose, UA: NEGATIVE
Ketones, UA: NEGATIVE
Leukocytes, UA: NEGATIVE
Nitrite, UA: NEGATIVE
Protein, UA: NEGATIVE
Spec Grav, UA: 1.015 (ref 1.010–1.025)
Urobilinogen, UA: 0.2 E.U./dL
pH, UA: 7.5 (ref 5.0–8.0)

## 2021-02-10 LAB — VITAMIN B12: Vitamin B-12: >1550 pg/mL — ABNORMAL HIGH (ref 211–911)

## 2021-02-10 LAB — PSA: PSA: 0.39 ng/mL (ref 0.10–4.00)

## 2021-02-10 LAB — TSH: TSH: 0.95 u[IU]/mL (ref 0.35–5.50)

## 2021-02-10 NOTE — Patient Instructions (Addendum)
Health Maintenance Due  Topic Date Due   COVID-19 Vaccine (1) Patient will call us back with the dates. Plus please consider omicron booster at pharmacy Never done   INFLUENZA VACCINE Patient will get this done when his job offers them and will update [place] with the information.  12/27/2020   mini cog today normal- normal clock draw and 1/3 recall.  I strongly suspect patient is overstretched by the amount he does any today and this is likely contributing to his memory loss.  Even though initial testing is normal though I asked him if he has worsening symptoms to schedule a visit prior to his 1 year visit and we can do further evaluation.  We will call you within two weeks about your referral to coronary calcium scoring. If you do not hear within 2 weeks, give [place] a call.   If you have new or worsening symptoms with chest pain seek care immediately  Please stop by lab before you go If you have mychart- we will send your results within 3 business days of [place] receiving them.  If you do not have mychart- we will call you about results within 5 business days of [place] receiving them.  *please also note that you will see labs on mychart as soon as they post. I will later go in and write notes on them- will say "notes from Dr. Yong Channel"   Recommended follow up: Return in about 1 year (around 02/10/2022) for physical or sooner if needed.

## 2021-02-10 NOTE — Addendum Note (Signed)
Addended by: Loura Back on: 02/10/2021 10:32 AM   Modules accepted: Orders

## 2021-02-28 ENCOUNTER — Other Ambulatory Visit: Payer: Self-pay

## 2021-02-28 ENCOUNTER — Ambulatory Visit (INDEPENDENT_AMBULATORY_CARE_PROVIDER_SITE_OTHER)
Admission: RE | Admit: 2021-02-28 | Discharge: 2021-02-28 | Disposition: A | Discharge status: Home / Self Care | Payer: Self-pay | Source: Ambulatory Visit | Attending: Family Medicine | Admitting: Family Medicine

## 2021-02-28 DIAGNOSIS — E785 Hyperlipidemia, unspecified: Secondary | ICD-10-CM

## 2021-03-01 ENCOUNTER — Other Ambulatory Visit: Payer: Self-pay

## 2021-03-01 DIAGNOSIS — E785 Hyperlipidemia, unspecified: Secondary | ICD-10-CM

## 2021-03-01 DIAGNOSIS — R0789 Other chest pain: Secondary | ICD-10-CM

## 2021-03-01 MED ORDER — ROSUVASTATIN CALCIUM 5 MG PO TABS: 5.0000 mg | ORAL_TABLET | Freq: Every day | ORAL | 3 refills | Status: DC

## 2021-03-31 ENCOUNTER — Other Ambulatory Visit: Payer: Self-pay | Admitting: Family Medicine

## 2021-04-17 ENCOUNTER — Encounter: Payer: Self-pay | Admitting: Cardiovascular Disease

## 2021-04-17 NOTE — Progress Notes (Signed)
Cardiology Office Note:    Date:  04/18/2021   ID:  Lucas Rocha, DOB 12-Oct-1965, MRN [id number]  PCP:  Lucas Olp, MD   [facility] Cardiologist:  Lucas Rocha to update primary MD,subspecialty MD or APP then REFRESH:1}    Referring MD: Lucas Olp, MD   Chief Complaint  Patient presents with   Chest Pain     Nov. 21, 2022     Lucas Rocha is a 55 y.o. male with a hx of  chest pain . We were asked by Dr. Yong Rocha to see him for further evaluation of his chest pain .  ( Husband of Lucas Rocha )   Lucas Rocha is seen for CP . Pinpoint like CP  Occurs now and then , off and on through the day  Might last an hour or so  Not related to eating or drinkig, twisting his torso  No exertional , Not much exercise recently  Eats an unrestricted diet - Is on omeprazole .   Coronary calcium score of 187 Agatston units.  ( All located in the LAD ) This was 90th percentile for age-, race-, and sex-matched controls LDL is 100 - was just started on rosuvastatin      Past Medical History:  Diagnosis Date   Allergic rhinitis    Allergy    Anemia    BPH (benign prostatic hyperplasia)    some nocturia   GERD (gastroesophageal reflux disease)    History of iron deficiency anemia    Lower urinary tract symptoms (LUTS)    OSA (obstructive sleep apnea)    oral appliance   Right ureteral stone    Sleep apnea    cpap    Past Surgical History:  Procedure Laterality Date   COLONOSCOPY     CYSTOSCOPY WITH RETROGRADE PYELOGRAM, URETEROSCOPY AND STENT PLACEMENT Right 05/14/2013   Procedure: [place], URETEROSCOPY AND STENT PLACEMENT;  Surgeon: Alexis Frock, MD;  Location: [facility];  Service: Urology;  Laterality: Right;   EXTRACORPOREAL SHOCK WAVE LITHOTRIPSY Right 02-13-2013   FOOT GANGLION EXCISION Left 1999   HOLMIUM LASER APPLICATION Right 42/68/3419   Procedure: HOLMIUM LASER APPLICATION;  Surgeon:  Alexis Frock, MD;  Location: [facility];  Service: Urology;  Laterality: Right;   POLYPECTOMY      Current Medications: Current Meds  Medication Sig   alfuzosin (UROXATRAL) 10 MG 24 hr tablet TAKE 1 TABLET BY MOUTH  DAILY WITH BREAKFAST   aspirin EC 81 MG tablet Take 1 tablet (81 mg total) by mouth daily. Swallow whole.   fexofenadine (ALLEGRA) 60 MG tablet Take 60 mg by mouth as needed.    fluticasone (FLONASE) 50 MCG/ACT nasal spray Place 2 sprays into both nostrils daily. (Patient taking differently: Place 2 sprays into both nostrils as needed.)   metoprolol tartrate (LOPRESSOR) 100 MG tablet Take one tablet by mouth 2 hours prior to your CT   nitroGLYCERIN (NITROSTAT) 0.4 MG SL tablet Place 1 tablet (0.4 mg total) under the tongue every 5 (five) minutes as needed for chest pain.   omeprazole (PRILOSEC) 20 MG capsule TAKE 1 CAPSULE BY MOUTH  DAILY   rosuvastatin (CRESTOR) 5 MG tablet Take 1 tablet (5 mg total) by mouth daily.   sildenafil (VIAGRA) 100 MG tablet Take 1 tablet (100 mg total) by mouth daily as needed for erectile dysfunction.     Allergies:   Penicillins and Sulfa antibiotics   Social History  Socioeconomic History   Marital status: Married    Spouse name: Not on file   Number of children: 2   Years of education: Not on file   Highest education level: Not on file  Occupational History   Occupation: Engineer, drilling     Employer: [employer]  Tobacco Use   Smoking status: Never   Smokeless tobacco: Never  Substance and Sexual Activity   Alcohol use: Yes    Comment: rare alcohol   Drug use: No   Sexual activity: Not on file  Other Topics Concern   Not on file  Social History Narrative   Married. 2 daughters. 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       Works as Patent examiner for [employer]: workaholic   Social Determinants of Radio broadcast assistant  Strain: Not on [employer] Insecurity: Not on file  Transportation Needs: Not on file  Physical Activity: Not on file  Stress: Not on file  Social Connections: Not on file     Family History: The patient's family history includes Arthritis in his mother; Cancer in his father; Diabetes in his maternal grandfather; Heart disease in an other family member. There is no history of Colon cancer, Colon polyps, Esophageal cancer, Stomach cancer, or Rectal cancer.  ROS:   Please see the history of present illness.     All other systems reviewed and are negative.  EKGs/Labs/Other Studies Reviewed:    The following studies were reviewed today:   EKG:  Nov. 21, 2022:   NSR , inc. RBBB , HR is 90  Recent Labs: 02/10/2021: ALT 29; BUN 11; Creatinine, Ser 0.95; Hemoglobin 15.1; Platelets 280.0; Potassium 4.3; Sodium 140; TSH 0.95  Recent Lipid Panel    Component Value Date/Time   CHOL 171 02/10/2021 0923   TRIG 102.0 02/10/2021 0923   HDL 50.90 02/10/2021 0923   CHOLHDL 3 02/10/2021 0923   VLDL 20.4 02/10/2021 0923   LDLCALC 100 (H) 02/10/2021 0923   LDLCALC 101 (H) 02/10/2020 1004     Risk Assessment/Calculations:           Physical Exam:    VS:  BP 130/70 (BP Location: Left Arm, Patient Position: Sitting, Cuff Size: Normal)   Pulse 90   Ht 6' (1.829 m)   Wt 197 lb 6.4 oz (89.5 kg)   SpO2 99%   BMI 26.77 kg/m     Wt Readings from Last 3 Encounters:  04/18/21 197 lb 6.4 oz (89.5 kg)  02/10/21 192 lb 12.8 oz (87.5 kg)  02/10/20 196 lb 6.4 oz (89.1 kg)     GEN:  Well nourished, well developed in no acute distress HEENT: Normal NECK: No JVD; No carotid bruits LYMPHATICS: No lymphadenopathy CARDIAC: RRR, no murmurs, rubs, gallops RESPIRATORY:  Clear to auscultation without rales, wheezing or rhonchi  ABDOMEN: Soft, non-tender, non-distended MUSCULOSKELETAL:  No edema; No deformity  SKIN: Warm and dry NEUROLOGIC:  Alert and oriented x 3 PSYCHIATRIC:  Normal affect    ASSESSMENT:    1. Precordial pain   2. Other hyperlipidemia    PLAN:    In order of problems listed above:  Chest pain :  Symptoms are somewhat atypical but he has HLD and coronary calcifications in his LAD   Will get a coronary CT angio for further evaluation.            Medication Adjustments/Labs and  Tests Ordered: Current medicines are reviewed at length with the patient today.  Concerns regarding medicines are outlined above.  Orders Placed This Encounter  Procedures   CT CORONARY MORPH W/CTA COR W/SCORE W/CA W/CM &/OR WO/CM   Basic metabolic panel   Lipid panel   ALT   EKG 12-Lead   Meds ordered this encounter  Medications   aspirin EC 81 MG tablet    Sig: Take 1 tablet (81 mg total) by mouth daily. Swallow whole.    Dispense:  90 tablet    Refill:  3   nitroGLYCERIN (NITROSTAT) 0.4 MG SL tablet    Sig: Place 1 tablet (0.4 mg total) under the tongue every 5 (five) minutes as needed for chest pain.    Dispense:  25 tablet    Refill:  3   metoprolol tartrate (LOPRESSOR) 100 MG tablet    Sig: Take one tablet by mouth 2 hours prior to your CT    Dispense:  1 tablet    Refill:  0     Patient Instructions  Medication Instructions:  1) START Aspirin 81mg  once daily 2)  A prescription has been sent in for Nitroglycerin.  If you have chest pain that doesn't relieve quickly, place one tablet under your tongue and allow it to dissolve.  If no relief after 5 minutes, you may take another pill.  If no relief after 5 minutes, you may take a 3rd dose but you need to call 911 and report to ER immediately.  *If you need a refill on your cardiac medications before your next appointment, please call your pharmacy*   Lab Work: BMET, Lipid and ALT today  If you have labs (blood work) drawn today and your tests are completely normal, you will receive your results only by: [place] (if you have MyChart) OR A paper copy in the mail If you have any lab test that  is abnormal or we need to change your treatment, we will call you to review the results.   Testing/Procedures: Your physician recommends that you have a Coronary CT performed.    Follow-Up: At [facility], you and your health needs are our priority.  As part of our continuing mission to provide you with exceptional heart care, we have created designated Provider Care Teams.  These Care Teams include your primary Cardiologist (physician) and Advanced Practice Rocha (APPs -  Physician Assistants and Nurse Practitioners) who all work together to provide you with the care you need, when you need it.  We recommend signing up for the patient portal called "MyChart".  Sign up information is provided on this After Visit Summary.  MyChart is used to connect with patients for Virtual Visits (Telemedicine).  Patients are able to view lab/test results, encounter notes, upcoming appointments, etc.  Non-urgent messages can be sent to your provider as well.   To learn more about what you can do with MyChart, go to [email or web address].    Your next appointment:   3 month(s)  The format for your next appointment:   In Person  Provider:   Mertie Moores, MD     Other Instructions    Your cardiac CT will be scheduled at one of the below locations:   [facility] [address] [place], [place] [address] [phone]  [place] [address] [place], [place] [address] [phone]  If scheduled at [facility], please arrive at the [facility] main  entrance (entrance A) of [facility] 30 minutes prior to test start time. You can use the FREE valet parking offered at the main entrance (encouraged to control the heart rate for the test) Proceed to the [facility] Radiology Department (first floor) to check-in and test prep.  If scheduled at [facility], please arrive 15 mins early  for check-in and test prep.  Please follow these instructions carefully (unless otherwise directed):  Hold all erectile dysfunction medications at least 3 days (72 hrs) prior to test.  On the Night Before the Test: Be sure to Drink plenty of water. Do not consume any caffeinated/decaffeinated beverages or chocolate 12 hours prior to your test. Do not take any antihistamines 12 hours prior to your test.  On the Day of the Test: Drink plenty of water until 1 hour prior to the test. Do not eat any food 4 hours prior to the test. You may take your regular medications prior to the test.  Take metoprolol (Lopressor) two hours prior to test. HOLD Furosemide/Hydrochlorothiazide morning of the test.       After the Test: Drink plenty of water. After receiving IV contrast, you may experience a mild flushed feeling. This is normal. On occasion, you may experience a mild rash up to 24 hours after the test. This is not dangerous. If this occurs, you can take Benadryl 25 mg and increase your fluid intake. If you experience trouble breathing, this can be serious. If it is severe call 911 IMMEDIATELY. If it is mild, please call our office. If you take any of these medications: Glipizide/Metformin, Avandament, Glucavance, please do not take 48 hours after completing test unless otherwise instructed.  Please allow 2-4 weeks for scheduling of routine cardiac CTs. Some insurance companies require a pre-authorization which may delay scheduling of this test.   For non-scheduling related questions, please contact the cardiac imaging nurse navigator should you have any questions/concerns: Marchia Bond, Cardiac Imaging Nurse Navigator Gordy Clement, Cardiac Imaging Nurse Navigator [facility] Direct Office Dial: [phone]   For scheduling needs, including cancellations and rescheduling, please call [place], [phone].     Signed, Mertie Moores, MD  04/18/2021 11:52 AM     [place]

## 2021-04-18 ENCOUNTER — Encounter: Payer: Self-pay | Admitting: Cardiovascular Disease

## 2021-04-18 ENCOUNTER — Other Ambulatory Visit: Payer: Self-pay

## 2021-04-18 ENCOUNTER — Ambulatory Visit: Payer: 59 | Admitting: Cardiovascular Disease

## 2021-04-18 VITALS — BP 130/70 | HR 90 | Ht 72.0 in | Wt 197.4 lb

## 2021-04-18 DIAGNOSIS — R072 Precordial pain: Secondary | ICD-10-CM | POA: Diagnosis not present

## 2021-04-18 DIAGNOSIS — E7849 Other hyperlipidemia: Secondary | ICD-10-CM | POA: Diagnosis not present

## 2021-04-18 MED ORDER — ASPIRIN EC 81 MG PO TBEC: 81.0000 mg | DELAYED_RELEASE_TABLET | Freq: Every day | ORAL | 3 refills | Status: AC

## 2021-04-18 MED ORDER — NITROGLYCERIN 0.4 MG SL SUBL: 0.4000 mg | SUBLINGUAL_TABLET | SUBLINGUAL | 3 refills | Status: DC | PRN

## 2021-04-18 MED ORDER — METOPROLOL TARTRATE 100 MG PO TABS: ORAL_TABLET | ORAL | 0 refills | Status: DC

## 2021-04-18 NOTE — Patient Instructions (Addendum)
Medication Instructions:  1) START Aspirin 81mg  once daily 2)  A prescription has been sent in for Nitroglycerin.  If you have chest pain that doesn't relieve quickly, place one tablet under your tongue and allow it to dissolve.  If no relief after 5 minutes, you may take another pill.  If no relief after 5 minutes, you may take a 3rd dose but you need to call 911 and report to ER immediately.  *If you need a refill on your cardiac medications before your next appointment, please call your pharmacy*   Lab Work: BMET, Lipid and ALT today  If you have labs (blood work) drawn today and your tests are completely normal, you will receive your results only by: [place] (if you have MyChart) OR A paper copy in the mail If you have any lab test that is abnormal or we need to change your treatment, we will call you to review the results.   Testing/Procedures: Your physician recommends that you have a Coronary CT performed.    Follow-Up: At [facility], you and your health needs are our priority.  As part of our continuing mission to provide you with exceptional heart care, we have created designated Provider Care Teams.  These Care Teams include your primary Cardiologist (physician) and Advanced Practice Providers (APPs -  Physician Assistants and Nurse Practitioners) who all work together to provide you with the care you need, when you need it.  We recommend signing up for the patient portal called "MyChart".  Sign up information is provided on this After Visit Summary.  MyChart is used to connect with patients for Virtual Visits (Telemedicine).  Patients are able to view lab/test results, encounter notes, upcoming appointments, etc.  Non-urgent messages can be sent to your provider as well.   To learn more about what you can do with MyChart, go to [email or web address].    Your next appointment:   3 month(s)  The format for your next appointment:   In Person  Provider:   Mertie Moores, MD     Other Instructions    Your cardiac CT will be scheduled at one of the below locations:   [facility] [address] [place], [place] [address] [phone]  [place] [address] [place], [place] [address] [phone]  If scheduled at [facility], please arrive at the [facility] main entrance (entrance A) of [facility] 30 minutes prior to test start time. You can use the FREE valet parking offered at the main entrance (encouraged to control the heart rate for the test) Proceed to the [facility] Radiology Department (first floor) to check-in and test prep.  If scheduled at [facility], please arrive 15 mins early for check-in and test prep.  Please follow these instructions carefully (unless otherwise directed):  Hold all erectile dysfunction medications at least 3 days (72 hrs) prior to test.  On the Night Before the Test: Be sure to Drink plenty of water. Do not consume any caffeinated/decaffeinated beverages or chocolate 12 hours prior to your test. Do not take any antihistamines 12 hours prior to your test.  On the Day of the Test: Drink plenty of water until 1 hour prior to the test. Do not eat any food 4 hours prior to the test. You may take your regular medications prior to the test.  Take metoprolol (Lopressor) two hours prior to test. HOLD Furosemide/Hydrochlorothiazide morning of the  test.       After the Test: Drink plenty of water. After receiving IV contrast, you may experience a mild flushed feeling. This is normal. On occasion, you may experience a mild rash up to 24 hours after the test. This is not dangerous. If this occurs, you can take Benadryl 25 mg and increase your fluid intake. If you experience trouble breathing, this can be serious. If it is severe call 911 IMMEDIATELY. If it is mild, please call our office. If you  take any of these medications: Glipizide/Metformin, Avandament, Glucavance, please do not take 48 hours after completing test unless otherwise instructed.  Please allow 2-4 weeks for scheduling of routine cardiac CTs. Some insurance companies require a pre-authorization which may delay scheduling of this test.   For non-scheduling related questions, please contact the cardiac imaging nurse navigator should you have any questions/concerns: Marchia Bond, Cardiac Imaging Nurse Navigator Gordy Clement, Cardiac Imaging Nurse Navigator [facility] Direct Office Dial: [phone]   For scheduling needs, including cancellations and rescheduling, please call [place], [phone].

## 2021-04-19 LAB — BASIC METABOLIC PANEL
BUN/Creatinine Ratio: 13 (ref 9–20)
BUN: 13 mg/dL (ref 6–24)
CO2: 27 mmol/L (ref 20–29)
Calcium: 9.6 mg/dL (ref 8.7–10.2)
Chloride: 103 mmol/L (ref 96–106)
Creatinine, Ser: 0.99 mg/dL (ref 0.76–1.27)
Glucose: 85 mg/dL (ref 70–99)
Potassium: 4.4 mmol/L (ref 3.5–5.2)
Sodium: 142 mmol/L (ref 134–144)
eGFR: 91 mL/min/{1.73_m2} (ref >59)

## 2021-04-19 LAB — LIPID PANEL
Chol/HDL Ratio: 2.5 ratio (ref 0.0–5.0)
Cholesterol, Total: 129 mg/dL (ref 100–199)
HDL: 52 mg/dL (ref >39)
LDL Chol Calc (NIH): 58 mg/dL (ref 0–99)
Triglycerides: 105 mg/dL (ref 0–149)
VLDL Cholesterol Cal: 19 mg/dL (ref 5–40)

## 2021-04-19 LAB — ALT: ALT: 30 IU/L (ref 0–44)

## 2021-05-02 ENCOUNTER — Telehealth (HOSPITAL_COMMUNITY): Payer: Self-pay | Admitting: Emergency Medicine

## 2021-05-02 NOTE — Telephone Encounter (Signed)
Reaching out to patient to offer assistance regarding upcoming cardiac imaging study; pt verbalizes understanding of appt date/time, parking situation and where to check in, pre-test NPO status and medications ordered, and verified current allergies; name and call back number provided for further questions should they arise Lucas Bond RN Navigator Cardiac Imaging [facility] [phone] office [phone] cell  Arrival 330 Denies iv issues 100mg  metoprolol tart

## 2021-05-03 ENCOUNTER — Other Ambulatory Visit: Payer: Self-pay

## 2021-05-03 ENCOUNTER — Ambulatory Visit (HOSPITAL_COMMUNITY)
Admission: RE | Admit: 2021-05-03 | Discharge: 2021-05-03 | Disposition: A | Discharge status: Home / Self Care | Payer: 59 | Source: Ambulatory Visit | Attending: Cardiovascular Disease | Admitting: Cardiovascular Disease

## 2021-05-03 DIAGNOSIS — R072 Precordial pain: Secondary | ICD-10-CM | POA: Insufficient documentation

## 2021-05-03 MED ORDER — IOHEXOL 350 MG/ML SOLN
959.0000 mL | Freq: Once | INTRAVENOUS | Status: AC | PRN
  Administered 2021-05-03: 959 mL via INTRAVENOUS

## 2021-05-03 MED ORDER — NITROGLYCERIN 0.4 MG SL SUBL
0.8000 mg | SUBLINGUAL_TABLET | Freq: Once | SUBLINGUAL | Status: AC
  Administered 2021-05-03: 0.8 mg via SUBLINGUAL

## 2021-05-03 MED ORDER — NITROGLYCERIN 0.4 MG SL SUBL
SUBLINGUAL_TABLET | SUBLINGUAL | Status: AC
  Filled 2021-05-03: qty 2

## 2021-05-03 NOTE — Progress Notes (Signed)
CT scan completed. Tolerated well. D/C home ambulatory with wife. Awake and alert. In no distress

## 2021-05-19 ENCOUNTER — Telehealth: Payer: Self-pay | Admitting: Cardiovascular Disease

## 2021-05-19 NOTE — Telephone Encounter (Signed)
Olivia Mackie from [facility] Radiology calling with CT results.

## 2021-05-19 NOTE — Telephone Encounter (Signed)
Spoke with [facility] Radiology. They are calling regarding number 1 under impressions.  Report reviewed with Dr Curt Bears (office DOD) and patient should contact PCP for follow up.  Report routed to Dr Yong Channel.  I placed call to patient and left message to call office

## 2021-06-01 ENCOUNTER — Other Ambulatory Visit: Payer: Self-pay

## 2021-06-01 ENCOUNTER — Encounter: Payer: Self-pay | Admitting: Family Medicine

## 2021-06-01 ENCOUNTER — Ambulatory Visit: Payer: 59 | Admitting: Family Medicine

## 2021-06-01 VITALS — BP 130/80 | HR 84 | Temp 97.9°F | Ht 72.0 in | Wt 195.2 lb

## 2021-06-01 DIAGNOSIS — I2510 Atherosclerotic heart disease of native coronary artery without angina pectoris: Secondary | ICD-10-CM | POA: Insufficient documentation

## 2021-06-01 DIAGNOSIS — E785 Hyperlipidemia, unspecified: Secondary | ICD-10-CM

## 2021-06-01 DIAGNOSIS — I2583 Coronary atherosclerosis due to lipid rich plaque: Secondary | ICD-10-CM

## 2021-06-01 DIAGNOSIS — I700 Atherosclerosis of aorta: Secondary | ICD-10-CM

## 2021-06-01 DIAGNOSIS — Z8701 Personal history of pneumonia (recurrent): Secondary | ICD-10-CM | POA: Diagnosis not present

## 2021-06-01 HISTORY — DX: Hyperlipidemia, unspecified: E78.5

## 2021-06-01 HISTORY — DX: Atherosclerosis of aorta: I70.0

## 2021-06-01 HISTORY — DX: Atherosclerotic heart disease of native coronary artery without angina pectoris: I25.10

## 2021-06-01 NOTE — Progress Notes (Addendum)
Phone [phone] In person visit   Subjective:   Lucas Rocha is a 56 y.o. year old very pleasant male patient who presents for/with See problem oriented charting Chief Complaint  Patient presents with   Follow-up   Hyperlipidemia   Gastroesophageal Reflux    This visit occurred during the SARS-CoV-2 public health emergency.  Safety protocols were in place, including screening questions prior to the visit, additional usage of staff PPE, and extensive cleaning of exam room while observing appropriate contact time as indicated for disinfecting solutions.   Past Medical History-  Patient Active Problem List   Diagnosis Date Noted   nonobstructive CAD (coronary artery disease) 06/01/2021    Priority: High   Aortic atherosclerosis ([place]) 06/01/2021    Priority: Medium    Hyperlipidemia, unspecified 06/01/2021    Priority: Medium    BPH (benign prostatic hyperplasia)     Priority: Medium    G E R D 12/28/2006    Priority: Medium    Erectile dysfunction 09/17/2017    Priority: Low   OSA (obstructive sleep apnea)     Priority: Low   Ureterolithiasis 02/07/2013    Priority: Low   Gastric polyp 12/12/2011    Priority: Low   Diverticulosis 12/07/2011    Priority: Low   Hx of adenomatous colonic polyps 12/07/2011    Priority: Low   Iron deficiency anemia 10/24/2011    Priority: Low   Allergic rhinitis 01/01/2007    Priority: Low    Medications- reviewed and updated Current Outpatient Medications  Medication Sig Dispense Refill   alfuzosin (UROXATRAL) 10 MG 24 hr tablet TAKE 1 TABLET BY MOUTH  DAILY WITH BREAKFAST 90 tablet 3   aspirin EC 81 MG tablet Take 1 tablet (81 mg total) by mouth daily. Swallow whole. 90 tablet 3   fexofenadine (ALLEGRA) 60 MG tablet Take 60 mg by mouth as needed.      fluticasone (FLONASE) 50 MCG/ACT nasal spray Place 2 sprays into both nostrils daily. (Patient taking differently: Place 2 sprays into both nostrils as needed.) 48 g 3    nitroGLYCERIN (NITROSTAT) 0.4 MG SL tablet Place 1 tablet (0.4 mg total) under the tongue every 5 (five) minutes as needed for chest pain. 25 tablet 3   omeprazole (PRILOSEC) 20 MG capsule TAKE 1 CAPSULE BY MOUTH  DAILY 90 capsule 3   rosuvastatin (CRESTOR) 5 MG tablet Take 1 tablet (5 mg total) by mouth daily. 90 tablet 3   sildenafil (VIAGRA) 100 MG tablet Take 1 tablet (100 mg total) by mouth daily as needed for erectile dysfunction. 10 tablet 11   No current facility-administered medications for this visit.     Objective:  BP 130/80    Pulse 84    Temp 97.9 F (36.6 C)    Ht 6' (1.829 m)    Wt 195 lb 3.2 oz (88.5 kg)    SpO2 96%    BMI 26.47 kg/m  Gen: NAD, resting comfortably CV: RRR no murmurs rubs or gallops Lungs: CTAB no crackles, wheeze, rhonchi Ext: no edema Skin: warm, dry     Assessment and Plan   #Abnormal CT imaging based off CT coronary morphology S: From 05/03/2021 CT scan with Dr. Acie Fredrickson  "IMPRESSION: 1. Since 02/28/2021, development of right upper and right lower lobe peribronchovascular ground-glass and airspace nodularity. Favor infection, including atypical etiologies. Despite concurrent hiatal hernia, aspiration is felt less likely given distribution. 2.  Aortic Atherosclerosis (ICD10-I70.0). 3. Mild right hilar adenopathy, favored to  be reactive." This was compared to elevated CT cardiac scoring  Has had some cough/congestion- taking fexofenadine on regular basis but has had that over time- not an appreciable increase from his baseline. In retrospect after conversation-stayed home from work one day in December due to illness and treated with azithromycin- actually 04/29/21 through 05/03/21.  Patient states all symptoms cleared A/P: Patient with  pneumonia in early December but has already been treated with azithromycin by an outside provider that was not aware of CT results.  No lingering symptoms.  Initially considered repeat CT but I think chest x-ray should  be adequate with clear prior infection noted and already having treatment and being a month out.  Certainly open to radiology's opinion as well.  #nonobstructive CAD #hyperlipidemia S: Medication:rosuvastatin 5 mg daily- no side effects , aspirin 81mg . Not using Nitroglycerin -Still does get some left-sided chest pain that cardiology did not think was cardiac-seems to be stress related Lab Results  Component Value Date   CHOL 129 04/18/2021   HDL 52 04/18/2021   LDLCALC 58 04/18/2021   TRIG 105 04/18/2021   CHOLHDL 2.5 04/18/2021   A/P: We discussed nonobstructive CAD as well as new finding of aortic atherosclerosis-both are  stable and asymptomatic-LDL has been at goal on rosuvastatin with target LDL under 70-we will continue current medications.  Also continue aspirin 81 mg. -We also discussed stress reduction   Recommended follow up: Keep physical in September or sooner if new or worsening symptoms Future Appointments  Date Time Provider [place]  07/19/2021  8:20 AM Nahser, Wonda Cheng, MD [facility]  02/16/2022  8:00 AM Yong Channel Brayton Mars, MD [facility]    Lab/Order associations:   ICD-10-CM   1. History of bacterial pneumonia  Z87.01 DG Chest 2 View    2. Coronary artery disease due to lipid rich plaque  I25.10    I25.83     3. Aortic atherosclerosis (HCC)  I70.0     4. Hyperlipidemia, unspecified hyperlipidemia type  E78.5         Return precautions advised.  Garret Reddish, MD

## 2021-06-01 NOTE — Addendum Note (Signed)
Addended by: Marin Olp on: 06/01/2021 03:25 PM   Modules accepted: Level of Service

## 2021-06-01 NOTE — Patient Instructions (Addendum)
Unable to find covid vaccine dates. Please send [place] dates if you find them  To ensure clearance of prior pneumonia lets get x-ray. Glad you are feeling better!  Please go to [facility]  central X-ray (updated 07/24/2019) - located [address] across the street from [place] - in the basement - Hours: 8:30-5:00 PM M-F (with lunch from 12:30- 1 PM). You do NOT need an appointment.  - Please ensure you are covid symptom free before going in(No fever, chills, cough, congestion, runny nose, shortness of breath, fatigue, body aches, sore throat, headache, nausea, vomiting, diarrhea, or new loss of taste or smell. No known contacts with covid 19 or someone being tested for covid 19)

## 2021-06-03 ENCOUNTER — Ambulatory Visit (INDEPENDENT_AMBULATORY_CARE_PROVIDER_SITE_OTHER)
Admission: RE | Admit: 2021-06-03 | Discharge: 2021-06-03 | Disposition: A | Discharge status: Home / Self Care | Payer: 59 | Source: Ambulatory Visit | Attending: Family Medicine | Admitting: Family Medicine

## 2021-06-03 ENCOUNTER — Other Ambulatory Visit: Payer: Self-pay

## 2021-06-03 DIAGNOSIS — Z8701 Personal history of pneumonia (recurrent): Secondary | ICD-10-CM | POA: Diagnosis not present

## 2021-06-03 NOTE — Telephone Encounter (Signed)
Patient discussed abnormal CT results with Dr. Yong Channel on 01/04 at office visit.

## 2021-06-29 ENCOUNTER — Other Ambulatory Visit: Payer: Self-pay

## 2021-06-29 MED ORDER — ROSUVASTATIN CALCIUM 5 MG PO TABS: 5.0000 mg | ORAL_TABLET | Freq: Every day | ORAL | 3 refills | Status: DC

## 2021-07-18 ENCOUNTER — Encounter: Payer: Self-pay | Admitting: Cardiovascular Disease

## 2021-07-18 NOTE — Progress Notes (Signed)
Cardiology Office Note:    Date:  07/19/2021   ID:  Lucas Rocha, DOB Aug 29, 1965, MRN [id number]  PCP:  Marin Olp, MD   [facility] Providers Cardiologist:  Musette Kisamore    Referring MD: Marin Olp, MD   Chief Complaint  Patient presents with   Chest Pain     Nov. 21, 2022     Lucas Rocha is a 56 y.o. male with a hx of  chest pain . We were asked by Dr. Yong Channel to see him for further evaluation of his chest pain .  ( Husband of Loring Liskey )   Lucas Rocha is seen for CP . Pinpoint like CP  Occurs now and then , off and on through the day  Might last an hour or so  Not related to eating or drinkig, twisting his torso  No exertional , Not much exercise recently  Eats an unrestricted diet - Is on omeprazole .   Coronary calcium score of 187 Agatston units.  ( All located in the LAD ) This was 90th percentile for age-, race-, and sex-matched controls LDL is 100 - was just started on rosuvastatin   Feb. 21, 2023 Lucas Rocha is seen for follow up of his CP  Coronary CT angio showed a CAC of 54.9 He had minimal plaque in his LAD . Lipids from Nov. 2022 look good Not as much exercise  Walks the dog on occasion    Past Medical History:  Diagnosis Date   Allergic rhinitis    Allergy    Anemia    BPH (benign prostatic hyperplasia)    some nocturia   GERD (gastroesophageal reflux disease)    History of iron deficiency anemia    Lower urinary tract symptoms (LUTS)    OSA (obstructive sleep apnea)    oral appliance   Right ureteral stone    Sleep apnea    cpap    Past Surgical History:  Procedure Laterality Date   COLONOSCOPY     CYSTOSCOPY WITH RETROGRADE PYELOGRAM, URETEROSCOPY AND STENT PLACEMENT Right 05/14/2013   Procedure: [place], URETEROSCOPY AND STENT PLACEMENT;  Surgeon: Alexis Frock, MD;  Location: [facility];  Service: Urology;  Laterality: Right;   EXTRACORPOREAL SHOCK WAVE LITHOTRIPSY Right  02-13-2013   FOOT GANGLION EXCISION Left 1999   HOLMIUM LASER APPLICATION Right 82/[id number]/[id number]   Procedure: HOLMIUM LASER APPLICATION;  Surgeon: Alexis Frock, MD;  Location: [facility];  Service: Urology;  Laterality: Right;   POLYPECTOMY      Current Medications: Current Meds  Medication Sig   alfuzosin (UROXATRAL) 10 MG 24 hr tablet TAKE 1 TABLET BY MOUTH  DAILY WITH BREAKFAST   aspirin EC 81 MG tablet Take 1 tablet (81 mg total) by mouth daily. Swallow whole.   fexofenadine (ALLEGRA) 60 MG tablet Take 60 mg by mouth as needed.    fluticasone (FLONASE) 50 MCG/ACT nasal spray Place 2 sprays into both nostrils daily.   nitroGLYCERIN (NITROSTAT) 0.4 MG SL tablet Place 1 tablet (0.4 mg total) under the tongue every 5 (five) minutes as needed for chest pain.   omeprazole (PRILOSEC) 20 MG capsule TAKE 1 CAPSULE BY MOUTH  DAILY   rosuvastatin (CRESTOR) 5 MG tablet Take 1 tablet (5 mg total) by mouth daily.   sildenafil (VIAGRA) 100 MG tablet Take 1 tablet (100 mg total) by mouth daily as needed for erectile dysfunction.     Allergies:   Penicillins and Sulfa antibiotics  Social History   Socioeconomic History   Marital status: Married    Spouse name: Not on file   Number of children: 2   Years of education: Not on file   Highest education level: Not on file  Occupational History   Occupation: Engineer, drilling     Employer: [employer]  Tobacco Use   Smoking status: Never   Smokeless tobacco: Never  Substance and Sexual Activity   Alcohol use: Yes    Comment: rare alcohol   Drug use: No   Sexual activity: Not on file  Other Topics Concern   Not on file  Social History Narrative   Married. 2 daughters. 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 There is no history of Colon cancer, Colon polyps, Esophageal cancer, Stomach cancer, or Rectal cancer.  ROS:   Please see the history of present illness.     All other systems reviewed and are negative.  EKGs/Labs/Other Studies Reviewed:    The following studies were reviewed today:   EKG:     Recent Labs: 02/10/2021: Hemoglobin 15.1; Platelets 280.0; TSH 0.95 04/18/2021: ALT 30; BUN 13; Creatinine, Ser 0.99; Potassium 4.4; Sodium 142  Recent Lipid Panel    Component Value Date/Time   CHOL 129 04/18/2021 0930   TRIG 105 04/18/2021 0930   HDL 52 04/18/2021 0930   CHOLHDL 2.5 04/18/2021 0930   CHOLHDL 3 02/10/2021 0923   VLDL 20.4 02/10/2021 0923   LDLCALC 58 04/18/2021 0930   LDLCALC 101 (H) 02/10/2020 1004     Risk Assessment/Calculations:           Physical Exam:     Physical Exam: Blood pressure 138/78, pulse 88, height 6' (1.829 m), weight 196 lb 9.6 oz (89.2 kg), SpO2 95 %.  GEN:  Well nourished, well developed in no acute distress HEENT: Normal NECK: No JVD; No carotid bruits LYMPHATICS: No lymphadenopathy CARDIAC: RRR   RESPIRATORY:  Clear to auscultation without rales, wheezing or rhonchi  ABDOMEN: Soft, non-tender, non-distended MUSCULOSKELETAL:  No edema; No deformity  SKIN: Warm and dry NEUROLOGIC:  Alert and oriented x 3     ASSESSMENT:    1. Precordial pain   2. Aortic atherosclerosis (HCC)     PLAN:       CAD :  has mild CAD .  LDL looks good - 58. Cont Rosuvastatin . Encouraged him to do more cardio exercise .  2  Hyperlipidemia :  cont  Rosuvastatin     Will have him see an APP or me in 1 year for  follow up     Medication Adjustments/Labs and Tests Ordered: Current medicines are reviewed at length with the patient today.  Concerns regarding medicines are outlined above.  Orders Placed This Encounter  Procedures   ALT   Basic metabolic panel   Lipid panel   No orders of the defined types were placed in  this encounter.    Patient Instructions  Medication Instructions:  Your physician recommends that you continue on your current medications as directed. Please refer to the Current Medication list given to you today.  *If you need a refill on your cardiac medications before your next appointment, please call your pharmacy*  Lab Work: Lipids. ALT and BMET prior to your next annual appointment If you have labs (blood work) drawn today and your tests are completely normal, you will receive your results only by: [place] (if you have MyChart) OR A paper copy in the mail If you have any lab test that is abnormal or we need to change your treatment, we will call you to review the results.   Follow-Up: At [facility], you and your health needs are our priority.  As part of our continuing mission to provide you with exceptional heart care, we have created designated Provider Care Teams.  These Care Teams include your primary Cardiologist (physician) and Advanced Practice Providers (APPs -  Physician Assistants and Nurse Practitioners) who all work together to provide you with the care you need, when you need it.  Your next appointment:   1 year(s)  The format for your next appointment:   In Person  Provider:   Mertie Moores, MD      Signed, Mertie Moores, MD  07/19/2021 8:23 AM    [place]

## 2021-07-19 ENCOUNTER — Encounter: Payer: Self-pay | Admitting: Cardiovascular Disease

## 2021-07-19 ENCOUNTER — Encounter (INDEPENDENT_AMBULATORY_CARE_PROVIDER_SITE_OTHER): Payer: Self-pay

## 2021-07-19 ENCOUNTER — Ambulatory Visit: Payer: 59 | Admitting: Cardiovascular Disease

## 2021-07-19 ENCOUNTER — Other Ambulatory Visit: Payer: Self-pay

## 2021-07-19 VITALS — BP 138/78 | HR 88 | Ht 72.0 in | Wt 196.6 lb

## 2021-07-19 DIAGNOSIS — I700 Atherosclerosis of aorta: Secondary | ICD-10-CM

## 2021-07-19 DIAGNOSIS — R072 Precordial pain: Secondary | ICD-10-CM

## 2021-07-19 NOTE — Patient Instructions (Signed)
Medication Instructions:  Your physician recommends that you continue on your current medications as directed. Please refer to the Current Medication list given to you today.  *If you need a refill on your cardiac medications before your next appointment, please call your pharmacy*  Lab Work: Lipids. ALT and BMET prior to your next annual appointment If you have labs (blood work) drawn today and your tests are completely normal, you will receive your results only by: [place] (if you have MyChart) OR A paper copy in the mail If you have any lab test that is abnormal or we need to change your treatment, we will call you to review the results.   Follow-Up: At [facility], you and your health needs are our priority.  As part of our continuing mission to provide you with exceptional heart care, we have created designated Provider Care Teams.  These Care Teams include your primary Cardiologist (physician) and Advanced Practice Providers (APPs -  Physician Assistants and Nurse Practitioners) who all work together to provide you with the care you need, when you need it.  Your next appointment:   1 year(s)  The format for your next appointment:   In Person  Provider:   Mertie Moores, MD

## 2021-08-18 ENCOUNTER — Other Ambulatory Visit: Payer: Self-pay | Admitting: Family Medicine

## 2021-08-27 DIAGNOSIS — N200 Calculus of kidney: Secondary | ICD-10-CM

## 2021-08-27 HISTORY — DX: Calculus of kidney: N20.0

## 2021-09-11 ENCOUNTER — Encounter (HOSPITAL_BASED_OUTPATIENT_CLINIC_OR_DEPARTMENT_OTHER): Payer: Self-pay

## 2021-09-11 ENCOUNTER — Emergency Department (HOSPITAL_BASED_OUTPATIENT_CLINIC_OR_DEPARTMENT_OTHER)
Admission: EM | Admit: 2021-09-11 | Discharge: 2021-09-11 | Disposition: A | Discharge status: Home / Self Care | Payer: 59 | Attending: Emergency Medicine | Admitting: Emergency Medicine

## 2021-09-11 ENCOUNTER — Other Ambulatory Visit (HOSPITAL_BASED_OUTPATIENT_CLINIC_OR_DEPARTMENT_OTHER): Payer: Self-pay

## 2021-09-11 ENCOUNTER — Other Ambulatory Visit: Payer: Self-pay

## 2021-09-11 ENCOUNTER — Emergency Department (HOSPITAL_BASED_OUTPATIENT_CLINIC_OR_DEPARTMENT_OTHER): Payer: 59

## 2021-09-11 DIAGNOSIS — R109 Unspecified abdominal pain: Secondary | ICD-10-CM | POA: Diagnosis present

## 2021-09-11 DIAGNOSIS — N201 Calculus of ureter: Secondary | ICD-10-CM | POA: Diagnosis not present

## 2021-09-11 LAB — COMPREHENSIVE METABOLIC PANEL
ALT: 44 U/L (ref 0–44)
AST: 28 U/L (ref 15–41)
Albumin: 3.7 g/dL (ref 3.5–5.0)
Alkaline Phosphatase: 74 U/L (ref 38–126)
Anion gap: 6 (ref 5–15)
BUN: 21 mg/dL — ABNORMAL HIGH (ref 6–20)
CO2: 26 mmol/L (ref 22–32)
Calcium: 9 mg/dL (ref 8.9–10.3)
Chloride: 107 mmol/L (ref 98–111)
Creatinine, Ser: 1.29 mg/dL — ABNORMAL HIGH (ref 0.61–1.24)
GFR, Estimated: >60 mL/min (ref >60)
Glucose, Bld: 130 mg/dL — ABNORMAL HIGH (ref 70–99)
Potassium: 3.5 mmol/L (ref 3.5–5.1)
Sodium: 139 mmol/L (ref 135–145)
Total Bilirubin: 0.7 mg/dL (ref 0.3–1.2)
Total Protein: 6.9 g/dL (ref 6.5–8.1)

## 2021-09-11 LAB — URINALYSIS, ROUTINE W REFLEX MICROSCOPIC
Bilirubin Urine: NEGATIVE
Glucose, UA: NEGATIVE mg/dL
Ketones, ur: NEGATIVE mg/dL
Leukocytes,Ua: NEGATIVE
Nitrite: NEGATIVE
Protein, ur: 30 mg/dL — AB
Specific Gravity, Urine: >=1.03 (ref 1.005–1.030)
pH: 5.5 (ref 5.0–8.0)

## 2021-09-11 LAB — CBC WITH DIFFERENTIAL/PLATELET
Abs Immature Granulocytes: 0.02 10*3/uL (ref 0.00–0.07)
Basophils Absolute: 0 10*3/uL (ref 0.0–0.1)
Basophils Relative: 0 %
Eosinophils Absolute: 0.2 10*3/uL (ref 0.0–0.5)
Eosinophils Relative: 3 %
HCT: 41.8 % (ref 39.0–52.0)
Hemoglobin: 14.6 g/dL (ref 13.0–17.0)
Immature Granulocytes: 0 %
Lymphocytes Relative: 35 %
Lymphs Abs: 3.1 10*3/uL (ref 0.7–4.0)
MCH: 31.7 pg (ref 26.0–34.0)
MCHC: 34.9 g/dL (ref 30.0–36.0)
MCV: 90.9 fL (ref 80.0–100.0)
Monocytes Absolute: 0.8 10*3/uL (ref 0.1–1.0)
Monocytes Relative: 9 %
Neutro Abs: 4.6 10*3/uL (ref 1.7–7.7)
Neutrophils Relative %: 53 %
Platelets: 234 10*3/uL (ref 150–400)
RBC: 4.6 MIL/uL (ref 4.22–5.81)
RDW: 11.9 % (ref 11.5–15.5)
WBC: 8.8 10*3/uL (ref 4.0–10.5)
nRBC: 0 % (ref 0.0–0.2)

## 2021-09-11 LAB — URINALYSIS, MICROSCOPIC (REFLEX)

## 2021-09-11 LAB — LIPASE, BLOOD: Lipase: 29 U/L (ref 11–51)

## 2021-09-11 MED ORDER — IBUPROFEN 800 MG PO TABS: 800.0000 mg | ORAL_TABLET | Freq: Three times a day (TID) | ORAL | 0 refills | Status: DC | PRN

## 2021-09-11 MED ORDER — HYDROMORPHONE HCL 1 MG/ML IJ SOLN
1.0000 mg | Freq: Once | INTRAMUSCULAR | Status: AC
Start: 2021-09-11 — End: 2021-09-11
  Administered 2021-09-11: 1 mg via INTRAVENOUS
  Filled 2021-09-11: qty 1

## 2021-09-11 MED ORDER — SENNOSIDES-DOCUSATE SODIUM 8.6-50 MG PO TABS: 1.0000 | ORAL_TABLET | Freq: Every evening | ORAL | 0 refills | Status: DC | PRN

## 2021-09-11 MED ORDER — OXYCODONE-ACETAMINOPHEN 5-325 MG PO TABS: 1.0000 | ORAL_TABLET | Freq: Four times a day (QID) | ORAL | 0 refills | Status: DC | PRN

## 2021-09-11 MED ORDER — ONDANSETRON 4 MG PO TBDP: 4.0000 mg | ORAL_TABLET | Freq: Three times a day (TID) | ORAL | 0 refills | Status: DC | PRN

## 2021-09-11 MED ORDER — ONDANSETRON HCL 4 MG/2ML IJ SOLN
4.0000 mg | Freq: Once | INTRAMUSCULAR | Status: AC
  Administered 2021-09-11: 4 mg via INTRAVENOUS
  Filled 2021-09-11: qty 2

## 2021-09-11 MED ORDER — KETOROLAC TROMETHAMINE 30 MG/ML IJ SOLN
30.0000 mg | Freq: Once | INTRAMUSCULAR | Status: AC
  Administered 2021-09-11: 30 mg via INTRAVENOUS
  Filled 2021-09-11: qty 1

## 2021-09-11 NOTE — ED Provider Notes (Signed)
? ?Emergency Department Provider Note ? ? ?I have reviewed the triage vital signs and the nursing notes. ? ? ?HISTORY ? ?Chief Complaint ?Flank Pain ? ? ?HPI ?Lucas Rocha is a 55 y.o. male with PMH reviewed below including prior kidney stone requiring lithotripsy and stenting presents to the ED with right flank pain which has become severe. Initially had soreness and dark urination 2 days prior. The urine cleared and pain has worsened. No fever. No dark BMs. No CP or SOB.   ? ? ?Past Medical History:  ?Diagnosis Date  ? Allergic rhinitis   ? Allergy   ? Anemia   ? BPH (benign prostatic hyperplasia)   ? some nocturia  ? GERD (gastroesophageal reflux disease)   ? History of iron deficiency anemia   ? Lower urinary tract symptoms (LUTS)   ? OSA (obstructive sleep apnea)   ? oral appliance  ? Right ureteral stone   ? Sleep apnea   ? cpap  ? ? ?Review of Systems ? ?Constitutional: No fever/chills ?Eyes: No visual changes. ?ENT: No sore throat. ?Cardiovascular: Denies chest pain. ?Respiratory: Denies shortness of breath. ?Gastrointestinal: Positive right flank abdominal pain.  No nausea, no vomiting.  No diarrhea.  No constipation. ?Genitourinary: Negative for dysuria. Positive dark urination (resolved).  ?Musculoskeletal: Positive back pain.  ?Skin: Negative for rash. ? ?____________________________________________ ? ? ?PHYSICAL EXAM: ? ?VITAL SIGNS: ?ED Triage Vitals  ?Enc Vitals Group  ?   BP 09/11/21 0300 (!) 135/92  ?   Pulse Rate 09/11/21 0300 67  ?   Resp 09/11/21 0300 18  ?   Temp --   ?   Temp src --   ?   SpO2 09/11/21 0300 94 %  ?   Weight 09/11/21 0300 196 lb 10.4 oz (89.2 kg)  ?   Height 09/11/21 0300 6' (1.829 m)  ? ?Constitutional: Alert and oriented. Patient appears uncomfortable. Standing and frequent shifting with grimacing. ?Eyes: Conjunctivae are normal.  ?Head: Atraumatic. ?Nose: No congestion/rhinnorhea. ?Mouth/Throat: Mucous membranes are moist.   ?Neck: No stridor.   ?Cardiovascular: Good  peripheral circulation.   ?Respiratory: Normal respiratory effort.   ?Gastrointestinal: Soft and nontender. No distention.  ?Musculoskeletal:  No gross deformities of extremities. ?Neurologic:  Normal speech and language. ?Skin:  Skin is warm, dry and intact. No rash noted. ? ?____________________________________________ ?  ?LABS ?(all labs ordered are listed, but only abnormal results are displayed) ? ?Labs Reviewed  ?COMPREHENSIVE METABOLIC PANEL - Abnormal; Notable for the following components:  ?    Result Value  ? Glucose, Bld 130 (*)   ? BUN 21 (*)   ? Creatinine, Ser 1.29 (*)   ? All other components within normal limits  ?URINALYSIS, ROUTINE W REFLEX MICROSCOPIC - Abnormal; Notable for the following components:  ? APPearance HAZY (*)   ? Hgb urine dipstick LARGE (*)   ? Protein, ur 30 (*)   ? All other components within normal limits  ?URINALYSIS, MICROSCOPIC (REFLEX) - Abnormal; Notable for the following components:  ? Bacteria, UA FEW (*)   ? All other components within normal limits  ?URINE CULTURE  ?LIPASE, BLOOD  ?CBC WITH DIFFERENTIAL/PLATELET  ? ?____________________________________________ ? ?RADIOLOGY ? ?CT Renal Stone Study ? ?Result Date: 09/11/2021 ?CLINICAL DATA:  Flank pain. EXAM: CT ABDOMEN AND PELVIS WITHOUT CONTRAST TECHNIQUE: Multidetector CT imaging of the abdomen and pelvis was performed following the standard protocol without IV contrast. RADIATION DOSE REDUCTION: This exam was performed according to the departmental dose-optimization program  which includes automated exposure control, adjustment of the mA and/or kV according to patient size and/or use of iterative reconstruction technique. COMPARISON:  February 07, 2013 FINDINGS: Lower chest: No acute abnormality. Hepatobiliary: No focal liver abnormality is seen. No gallstones, gallbladder wall thickening, or biliary dilatation. Pancreas: Unremarkable. No pancreatic ductal dilatation or surrounding inflammatory changes. Spleen:  Subcentimeter calcified granulomas are seen scattered throughout the spleen. Adrenals/Urinary Tract: Adrenal glands are unremarkable. Kidneys are normal in size, without focal lesions. A 5 mm obstructing renal calculus is seen within the mid right ureter with mild to moderate severity right-sided hydronephrosis and hydroureter. 2 mm and 3 mm nonobstructing renal calculi are seen within the right kidney. Bladder is unremarkable. Stomach/Bowel: Stomach is within normal limits. Appendix appears normal. No evidence of bowel wall thickening, distention, or inflammatory changes. Vascular/Lymphatic: No significant vascular findings are present. No enlarged abdominal or pelvic lymph nodes. Reproductive: Prostate is unremarkable. Other: No abdominal wall hernia or abnormality. No abdominopelvic ascites. Musculoskeletal: No acute or significant osseous findings. IMPRESSION: 1. 5 mm obstructing renal calculus within the mid right ureter. 2. 2 mm and 3 mm nonobstructing right renal calculi. Electronically Signed   By: Virgina Norfolk M.D.   On: 09/11/2021 03:44   ? ?____________________________________________ ? ? ?PROCEDURES ? ?Procedure(s) performed:  ? ?Procedures ? ?None  ?____________________________________________ ? ? ?INITIAL IMPRESSION / ASSESSMENT AND PLAN / ED COURSE ? ?Pertinent labs & imaging results that were available during my care of the patient were reviewed by me and considered in my medical decision making (see chart for details). ?  ?This patient is Presenting for Evaluation of flank pain, which does require a range of treatment options, and is a complaint that involves a high risk of morbidity and mortality. ? ?The Differential Diagnoses includes but is not exclusive to acute appendicitis, renal colic, testicular torsion, urinary tract infection, prostatitis,  diverticulitis, small bowel obstruction, colitis, abdominal aortic aneurysm, gastroenteritis, constipation etc. ? ? ?Critical Interventions-  ?   ?Medications  ?ketorolac (TORADOL) 30 MG/ML injection 30 mg (30 mg Intravenous Given 09/11/21 0313)  ?ondansetron [facility]) injection 4 mg (4 mg Intravenous Given 09/11/21 0313)  ?HYDROmorphone (DILAUDID) injection 1 mg (1 mg Intravenous Given 09/11/21 0420)  ? ? ?Reassessment after intervention:  Pain improved.  ? ? ?I did obtain Additional Historical Information from wife at bedside. ? ?I decided to review pertinent External Data, and in summary patient with lithotripsy and cystoscopy with stent placement in 2014. Clinic notes not available. ?  ?Clinical Laboratory Tests Ordered, included no UTI. Culture sent. No leukocytosis. Creatinine at 1.29.  ? ?Radiologic Tests Ordered, included CT renal. I independently interpreted the images and agree with radiology interpretation.  ? ? ?Social Determinants of Health Risk no smoking.  ? ?Medical Decision Making: Summary:  ?Patient presents emergency department with right flank pain.  Seems most consistent with ureteral stone.  Doubt vascular etiology with right-sided symptoms.  Plan for CT renal along with labs, UA, pain management.  ? ?Reevaluation with update and discussion with patient and wife. Pain well controlled here. Additional meds sent to pharmacy. Discussed results and follow up plan with Urology along with ED return precautions. Patient comfortable with the plan at discharge.  ? ?Disposition: discharge ? ?____________________________________________ ? ?FINAL CLINICAL IMPRESSION(S) / ED DIAGNOSES ? ?Final diagnoses:  ?Right ureteral stone  ? ? ? ?NEW OUTPATIENT MEDICATIONS STARTED DURING THIS VISIT: ? ?New Prescriptions  ? IBUPROFEN (ADVIL) 800 MG TABLET    Take 1 tablet (800 mg  total) by mouth every 8 (eight) hours as needed.  ? ONDANSETRON (ZOFRAN-ODT) 4 MG DISINTEGRATING TABLET    Take 1 tablet (4 mg total) by mouth every 8 (eight) hours as needed.  ? OXYCODONE-ACETAMINOPHEN (PERCOCET/ROXICET) 5-325 MG TABLET    Take 1 tablet by mouth every 6 (six) hours as  needed for severe pain.  ? SENNA-DOCUSATE (SENOKOT-S) 8.6-50 MG TABLET    Take 1 tablet by mouth at bedtime as needed for moderate constipation (while taking percocet).  ? ? ?Note:  This document was prepared using Dr

## 2021-09-11 NOTE — ED Notes (Signed)
Pt drinking water to provide urine sample

## 2021-09-11 NOTE — ED Triage Notes (Signed)
POV, pt c/o right sided flank pain that began earlier tonight, reports hx of stones, nausea as well, amb, a&O x4 to room ?

## 2021-09-11 NOTE — Discharge Instructions (Signed)

## 2021-09-11 NOTE — ED Notes (Signed)
Pt sts pain 4/10 currently, pt sts unable to provide urine specimen at this time. ?

## 2021-09-12 ENCOUNTER — Other Ambulatory Visit: Payer: Self-pay | Admitting: Urology

## 2021-09-12 LAB — URINE CULTURE: Culture: NO GROWTH

## 2021-09-16 ENCOUNTER — Encounter (HOSPITAL_BASED_OUTPATIENT_CLINIC_OR_DEPARTMENT_OTHER): Payer: Self-pay | Admitting: Urology

## 2021-09-19 ENCOUNTER — Other Ambulatory Visit: Payer: Self-pay

## 2021-09-19 ENCOUNTER — Encounter (HOSPITAL_BASED_OUTPATIENT_CLINIC_OR_DEPARTMENT_OTHER): Payer: Self-pay | Admitting: Urology

## 2021-09-19 NOTE — Progress Notes (Signed)
Spoke w/ via phone for pre-op interview--- pt ?Lab needs dos----   no            ?Lab results------ pt had current lab work done 09-11-2021 CBCdiff/ CMP results in epic// ?Current ekg in epic/ chart ?COVID test -----patient states asymptomatic no test needed  ?Arrive at ------- 0900 on 09-20-2021 ?NPO after MN NO Solid Food.  Clear liquids from MN until--- 0800 ?Med rec completed ?Medications to take morning of surgery ----- crestor, prilosec, uroxatral  ?Diabetic medication ----- n/a  ?Patient instructed no nail polish to be worn day of surgery ?Patient instructed to bring photo id and insurance card day of surgery ?Patient aware to have Driver (ride ) / caregiver  for 24 hours after surgery --wife, sonya ?Patient Special Instructions ----- n/a ?Pre-Op special Istructions ----- n/a ?Patient verbalized understanding of instructions that were given at this phone interview. ?Patient denies shortness of breath, chest pain, fever, cough at this phone interview.  ?

## 2021-09-20 ENCOUNTER — Ambulatory Visit (HOSPITAL_BASED_OUTPATIENT_CLINIC_OR_DEPARTMENT_OTHER): Payer: 59 | Admitting: Certified Registered Nurse Anesthetist

## 2021-09-20 ENCOUNTER — Encounter (HOSPITAL_BASED_OUTPATIENT_CLINIC_OR_DEPARTMENT_OTHER): Payer: Self-pay | Admitting: Urology

## 2021-09-20 ENCOUNTER — Ambulatory Visit (HOSPITAL_BASED_OUTPATIENT_CLINIC_OR_DEPARTMENT_OTHER)
Admission: RE | Admit: 2021-09-20 | Discharge: 2021-09-20 | Disposition: A | Discharge status: Home / Self Care | Payer: 59 | Attending: Urology | Admitting: Urology

## 2021-09-20 ENCOUNTER — Other Ambulatory Visit: Payer: Self-pay

## 2021-09-20 ENCOUNTER — Ambulatory Visit (HOSPITAL_COMMUNITY): Payer: 59

## 2021-09-20 ENCOUNTER — Encounter (HOSPITAL_BASED_OUTPATIENT_CLINIC_OR_DEPARTMENT_OTHER)
Admission: RE | Disposition: A | Discharge status: Home / Self Care | Payer: Self-pay | Source: Home / Self Care | Attending: Urology

## 2021-09-20 DIAGNOSIS — K219 Gastro-esophageal reflux disease without esophagitis: Secondary | ICD-10-CM | POA: Diagnosis not present

## 2021-09-20 DIAGNOSIS — N135 Crossing vessel and stricture of ureter without hydronephrosis: Secondary | ICD-10-CM | POA: Diagnosis not present

## 2021-09-20 DIAGNOSIS — N201 Calculus of ureter: Secondary | ICD-10-CM

## 2021-09-20 DIAGNOSIS — N401 Enlarged prostate with lower urinary tract symptoms: Secondary | ICD-10-CM | POA: Insufficient documentation

## 2021-09-20 DIAGNOSIS — G4733 Obstructive sleep apnea (adult) (pediatric): Secondary | ICD-10-CM | POA: Insufficient documentation

## 2021-09-20 HISTORY — DX: Personal history of urinary calculi: Z87.442

## 2021-09-20 HISTORY — DX: Dependence on other enabling machines and devices: Z99.89

## 2021-09-20 HISTORY — DX: Hyperlipidemia, unspecified: E78.5

## 2021-09-20 HISTORY — DX: Obstructive sleep apnea (adult) (pediatric): G47.33

## 2021-09-20 HISTORY — DX: Diaphragmatic hernia without obstruction or gangrene: K44.9

## 2021-09-20 HISTORY — DX: Atherosclerotic heart disease of native coronary artery without angina pectoris: I25.10

## 2021-09-20 HISTORY — DX: Unspecified right bundle-branch block: I45.10

## 2021-09-20 HISTORY — DX: Calculus of kidney: N20.0

## 2021-09-20 HISTORY — DX: Male erectile dysfunction, unspecified: N52.9

## 2021-09-20 HISTORY — PX: CYSTOSCOPY WITH RETROGRADE PYELOGRAM, URETEROSCOPY AND STENT PLACEMENT: SHX5789

## 2021-09-20 SURGERY — CYSTOURETEROSCOPY, WITH RETROGRADE PYELOGRAM AND STENT INSERTION
Anesthesia: General | Site: Ureter | Laterality: Right

## 2021-09-20 MED ORDER — PROPOFOL 10 MG/ML IV BOLUS
INTRAVENOUS | Status: DC | PRN
  Administered 2021-09-20: 200 mg via INTRAVENOUS

## 2021-09-20 MED ORDER — PROPOFOL 10 MG/ML IV BOLUS
INTRAVENOUS | Status: AC
  Filled 2021-09-20: qty 20

## 2021-09-20 MED ORDER — OXYCODONE HCL 5 MG/5ML PO SOLN: 5.0000 mg | Freq: Once | ORAL | Status: DC | PRN

## 2021-09-20 MED ORDER — LACTATED RINGERS IV SOLN: INTRAVENOUS | Status: DC

## 2021-09-20 MED ORDER — ONDANSETRON HCL 4 MG/2ML IJ SOLN: 4.0000 mg | Freq: Once | INTRAMUSCULAR | Status: DC | PRN

## 2021-09-20 MED ORDER — ONDANSETRON HCL 4 MG/2ML IJ SOLN
INTRAMUSCULAR | Status: AC
  Filled 2021-09-20: qty 6

## 2021-09-20 MED ORDER — ONDANSETRON HCL 4 MG/2ML IJ SOLN
INTRAMUSCULAR | Status: DC | PRN
  Administered 2021-09-20: 4 mg via INTRAVENOUS

## 2021-09-20 MED ORDER — MIDAZOLAM HCL 2 MG/2ML IJ SOLN
INTRAMUSCULAR | Status: AC
  Filled 2021-09-20: qty 2

## 2021-09-20 MED ORDER — DEXAMETHASONE SODIUM PHOSPHATE 10 MG/ML IJ SOLN
INTRAMUSCULAR | Status: DC | PRN
  Administered 2021-09-20: 10 mg via INTRAVENOUS

## 2021-09-20 MED ORDER — LIDOCAINE 2% (20 MG/ML) 5 ML SYRINGE
INTRAMUSCULAR | Status: DC | PRN
  Administered 2021-09-20: 100 mg via INTRAVENOUS

## 2021-09-20 MED ORDER — PHENAZOPYRIDINE HCL 200 MG PO TABS: 200.0000 mg | ORAL_TABLET | Freq: Three times a day (TID) | ORAL | 0 refills | Status: DC | PRN

## 2021-09-20 MED ORDER — KETOROLAC TROMETHAMINE 30 MG/ML IJ SOLN: 30.0000 mg | Freq: Once | INTRAMUSCULAR | Status: DC | PRN

## 2021-09-20 MED ORDER — FENTANYL CITRATE (PF) 250 MCG/5ML IJ SOLN
INTRAMUSCULAR | Status: DC | PRN
  Administered 2021-09-20: 50 ug via INTRAVENOUS

## 2021-09-20 MED ORDER — MIDAZOLAM HCL 2 MG/2ML IJ SOLN
INTRAMUSCULAR | Status: DC | PRN
  Administered 2021-09-20: 2 mg via INTRAVENOUS

## 2021-09-20 MED ORDER — PHENYLEPHRINE 80 MCG/ML (10ML) SYRINGE FOR IV PUSH (FOR BLOOD PRESSURE SUPPORT)
PREFILLED_SYRINGE | INTRAVENOUS | Status: AC
  Filled 2021-09-20: qty 10

## 2021-09-20 MED ORDER — HYOSCYAMINE SULFATE SL 0.125 MG SL SUBL: 0.1250 mg | SUBLINGUAL_TABLET | Freq: Four times a day (QID) | SUBLINGUAL | 1 refills | Status: DC | PRN

## 2021-09-20 MED ORDER — CIPROFLOXACIN IN D5W 400 MG/200ML IV SOLN
INTRAVENOUS | Status: AC
  Filled 2021-09-20: qty 200

## 2021-09-20 MED ORDER — CIPROFLOXACIN IN D5W 400 MG/200ML IV SOLN
400.0000 mg | INTRAVENOUS | Status: AC
  Administered 2021-09-20: 400 mg via INTRAVENOUS

## 2021-09-20 MED ORDER — OXYCODONE HCL 5 MG PO TABS: 5.0000 mg | ORAL_TABLET | Freq: Once | ORAL | Status: DC | PRN

## 2021-09-20 MED ORDER — FENTANYL CITRATE (PF) 100 MCG/2ML IJ SOLN: 25.0000 ug | INTRAMUSCULAR | Status: DC | PRN

## 2021-09-20 MED ORDER — SODIUM CHLORIDE 0.9% FLUSH: 3.0000 mL | Freq: Two times a day (BID) | INTRAVENOUS | Status: DC

## 2021-09-20 MED ORDER — LIDOCAINE HCL (PF) 2 % IJ SOLN
INTRAMUSCULAR | Status: AC
  Filled 2021-09-20: qty 15

## 2021-09-20 MED ORDER — SODIUM CHLORIDE 0.9 % IR SOLN
Status: DC | PRN
  Administered 2021-09-20: 3000 mL

## 2021-09-20 MED ORDER — DEXAMETHASONE SODIUM PHOSPHATE 10 MG/ML IJ SOLN
INTRAMUSCULAR | Status: AC
  Filled 2021-09-20: qty 3

## 2021-09-20 MED ORDER — FENTANYL CITRATE (PF) 100 MCG/2ML IJ SOLN
INTRAMUSCULAR | Status: AC
  Filled 2021-09-20: qty 2

## 2021-09-20 MED ORDER — IOHEXOL 300 MG/ML  SOLN
INTRAMUSCULAR | Status: DC | PRN
Start: 2021-09-20 — End: 2021-09-20
  Administered 2021-09-20: 10 mL

## 2021-09-20 SURGICAL SUPPLY — 31 items
BAG DRAIN URO-CYSTO SKYTR STRL (DRAIN) ×2 IMPLANT
BAG DRN UROCATH (DRAIN) ×1
BASKET STONE 1.7 NGAGE (UROLOGICAL SUPPLIES) ×1 IMPLANT
BASKET ZERO TIP NITINOL 2.4FR (BASKET) IMPLANT
BSKT STON RTRVL ZERO TP 2.4FR (BASKET)
CATH URET 5FR 28IN CONE TIP (BALLOONS)
CATH URET 5FR 28IN OPEN ENDED (CATHETERS) IMPLANT
CATH URET 5FR 70CM CONE TIP (BALLOONS) IMPLANT
CLOTH BEACON ORANGE TIMEOUT ST (SAFETY) ×2 IMPLANT
DRSG TEGADERM 2-3/8X2-3/4 SM (GAUZE/BANDAGES/DRESSINGS) ×1 IMPLANT
ELECT REM PT RETURN 9FT ADLT (ELECTROSURGICAL)
ELECTRODE REM PT RTRN 9FT ADLT (ELECTROSURGICAL) IMPLANT
FIBER LASER FLEXIVA 365 (UROLOGICAL SUPPLIES) ×1 IMPLANT
GLOVE SURG SS PI 8.0 STRL IVOR (GLOVE) ×2 IMPLANT
GOWN STRL REUS W/TWL XL LVL3 (GOWN DISPOSABLE) ×2 IMPLANT
GUIDEWIRE ANG ZIPWIRE 038X150 (WIRE) IMPLANT
GUIDEWIRE STR DUAL SENSOR (WIRE) ×2 IMPLANT
INFUSOR MANOMETER BAG 3000ML (MISCELLANEOUS) IMPLANT
IV NS IRRIG 3000ML ARTHROMATIC (IV SOLUTION) ×2 IMPLANT
KIT BALLN UROMAX 15FX4 (MISCELLANEOUS) IMPLANT
KIT BALLN UROMAX 26 75X4 (MISCELLANEOUS) ×2
KIT TURNOVER CYSTO (KITS) ×2 IMPLANT
LASER FIB FLEXIVA PULSE ID 365 (Laser) ×1 IMPLANT
MANIFOLD NEPTUNE II (INSTRUMENTS) ×2 IMPLANT
NS IRRIG 500ML POUR BTL (IV SOLUTION) ×2 IMPLANT
PACK CYSTO (CUSTOM PROCEDURE TRAY) ×2 IMPLANT
STENT URET 6FRX26 CONTOUR (STENTS) ×1 IMPLANT
TRACTIP FLEXIVA PULS ID 200XHI (Laser) IMPLANT
TRACTIP FLEXIVA PULSE ID 200 (Laser)
TUBE CONNECTING 12X1/4 (SUCTIONS) ×2 IMPLANT
TUBING UROLOGY SET (TUBING) IMPLANT

## 2021-09-20 NOTE — Interval H&P Note (Signed)
History and Physical Interval Note:  He continues to have some symptoms and the stone remains visible on KUB today  ? ?09/20/2021 ?9:56 AM ? ?SAGE KOPERA  has presented today for surgery, with the diagnosis of RIGHT URETERAL [place].  The various methods of treatment have been discussed with the patient and family. After consideration of risks, benefits and other options for treatment, the patient has consented to  Procedure(s): ?CYSTOSCOPY WITH RIGHT RETROGRADE PYELOGRAM, URETEROSCOPY AND STENT PLACEMENT POSSIBLE HOLMIUM LASER LITHOTRIPSY (Right) as a surgical intervention.  The patient's history has been reviewed, patient examined, no change in status, stable for surgery.  I have reviewed the patient's chart and labs.  Questions were answered to the patient's satisfaction.   ? ? ?Irine Seal ? ? ?

## 2021-09-20 NOTE — Anesthesia Procedure Notes (Signed)
Procedure Name: LMA Insertion ?Date/Time: 09/20/2021 10:18 AM ?Performed by: Clearnce Sorrel, CRNA ?Pre-anesthesia Checklist: Patient identified, Emergency Drugs available, Suction available and Patient being monitored ?Patient Re-evaluated:Patient Re-evaluated prior to induction ?Oxygen Delivery Method: Circle System Utilized ?Preoxygenation: Pre-oxygenation with 100% oxygen ?Induction Type: IV induction ?Ventilation: Mask ventilation without difficulty ?LMA: LMA inserted ?LMA Size: 4.0 ?Number of attempts: 1 ?Airway Equipment and Method: Bite block ?Placement Confirmation: positive ETCO2 ?Tube secured with: Tape ?Dental Injury: Teeth and Oropharynx as per pre-operative assessment  ? ? ? ? ?

## 2021-09-20 NOTE — Anesthesia Preprocedure Evaluation (Signed)
Anesthesia Evaluation  ?Patient identified by MRN, date of birth, ID band ?Patient awake ? ? ? ?Reviewed: ?Allergy & Precautions, NPO status , Patient's Chart, lab work & pertinent test results ? ?Airway ?Mallampati: II ? ?TM Distance: >3 FB ?Neck ROM: Full ? ? ? Dental ?no notable dental hx. ? ?  ?Pulmonary ?sleep apnea and Continuous Positive Airway Pressure Ventilation ,  ?  ?Pulmonary exam normal ?breath sounds clear to auscultation ? ? ? ? ? ? Cardiovascular ?negative cardio ROS ?Normal cardiovascular exam ?Rhythm:Regular Rate:Normal ? ? ?  ?Neuro/Psych ?negative neurological ROS ? negative psych ROS  ? GI/Hepatic ?Neg liver ROS, GERD  Medicated,  ?Endo/Other  ?negative endocrine ROS ? Renal/GU ?negative Renal ROS  ?negative genitourinary ?  ?Musculoskeletal ?negative musculoskeletal ROS ?(+)  ? Abdominal ?  ?Peds ?negative pediatric ROS ?(+)  Hematology ?negative hematology ROS ?(+)   ?Anesthesia Other Findings ? ? Reproductive/Obstetrics ?negative OB ROS ? ?  ? ? ? ? ? ? ? ? ? ? ? ? ? ?  ?  ? ? ? ? ? ? ? ? ?Anesthesia Physical ?Anesthesia Plan ? ?ASA: 2 ? ?Anesthesia Plan: General  ? ?Post-op Pain Management: Minimal or no pain anticipated  ? ?Induction: Intravenous ? ?PONV Risk Score and Plan: 2 and Ondansetron, Dexamethasone and Treatment may vary due to age or medical condition ? ?Airway Management Planned: LMA ? ?Additional Equipment:  ? ?Intra-op Plan:  ? ?Post-operative Plan: Extubation in OR ? ?Informed Consent: I have reviewed the patients History and Physical, chart, labs and discussed the procedure including the risks, benefits and alternatives for the proposed anesthesia with the patient or authorized representative who has indicated his/her understanding and acceptance.  ? ? ? ?Dental advisory given ? ?Plan Discussed with: CRNA and Surgeon ? ?Anesthesia Plan Comments:   ? ? ? ? ? ? ?Anesthesia Quick Evaluation ? ?

## 2021-09-20 NOTE — Discharge Instructions (Addendum)
You had a stricture in the lower ureter below the stone and I had to dilate it, because of that I needed to leave a stent to help it heal.  You may remove the stent by pulling the attached string on Friday morning.    ? ?Please bring your stone to the office at follow up.  ? ? ? ? ?Post Anesthesia Home Care Instructions ? ?Activity: ?Get plenty of rest for the remainder of the day. A responsible individual must stay with you for 24 hours following the procedure.  ?For the next 24 hours, DO NOT: ?-Drive a car ?-Paediatric nurse ?-Drink alcoholic beverages ?-Take any medication unless instructed by your physician ?-Make any legal decisions or sign important papers. ? ?Meals: ?Start with liquid foods such as gelatin or soup. Progress to regular foods as tolerated. Avoid greasy, spicy, heavy foods. If nausea and/or vomiting occur, drink only clear liquids until the nausea and/or vomiting subsides. Call your physician if vomiting continues. ? ?Special Instructions/Symptoms: ?Your throat may feel dry or sore from the anesthesia or the breathing tube placed in your throat during surgery. If this causes discomfort, gargle with warm salt water. The discomfort should disappear within 24 hours. ? ?If you had a scopolamine patch placed behind your ear for the management of post- operative nausea and/or vomiting: ? ?1. The medication in the patch is effective for 72 hours, after which it should be removed.  Wrap patch in a tissue and discard in the trash. Wash hands thoroughly with soap and water. ?2. You may remove the patch earlier than 72 hours if you experience unpleasant side effects which may include dry mouth, dizziness or visual disturbances. ?3. Avoid touching the patch. Wash your hands with soap and water after contact with the patch. ?    ?

## 2021-09-20 NOTE — H&P (Signed)
I have ureteral stone.  ?HPI: Lucas Rocha is a 56 year-old male patient who was referred by Dr. Nanda Quinton, MD who is here for ureteral stone. ? ? ? ?Lucas Rocha is a 56 yo male who was seen in the ER on 4/16 for right flank pain. A CT showed a 61m right proximal stone just above the pelvic brim. He had mild AKI with a Cr of 1.29 and microhematuria. He has a prior history of stones and had URS by Dr. MTresa Moorein 2014 after 2 failed ESWL's. His stone was mixed CaOx and CaP. It was visible on the CT scout film. He had low back pain about 1.5 weeks ago. Thursday AM he had hematuria. Now the pain is better with oxycodone and Ibuprofen but he had N/V yesterday. He currently has urgency with small voids.  ? ? ?  ?CC: AUA Questions Scoring.  ?HPI:  ? ?  ?AUA Symptom Score: 50% of the time he has the sensation of not emptying his bladder completely when finished urinating. More than 50% of the time he has to urinate again fewer than two hours after he has finished urinating. He does not have to stop and start again several times when he urinates. More than 50% of the time he finds it difficult to postpone urination. Almost always he has a weak urinary stream. He never has to push or strain to begin urination. He has to get up to urinate 2 times from the time he goes to bed until the time he gets up in the morning.  ? ?Calculated AUA Symptom Score: 18 ? ?  ?ALLERGIES: Penicillins ?Sulfa Drugs ?  ? ?MEDICATIONS: Aspirin  ?Alfuzosin Hcl Er  ?Omeprazole CPDR Oral  ?Rosuvastatin Calcium  ?  ? ?GU PSH: Cysto Uretero Lithotripsy - 2014 ?Cystoscopy Insert Stent - 2014 ?ESWL - 2014 ?Repair Sperm Duct - 2014 ? ?  ?   ?PSH Notes: Cystoscopy With Ureteroscopy With Lithotripsy, Cystoscopy With Insertion Of Ureteral Stent Right, Lithotripsy, Arthrocentesis Aspiration Of Ganglion Cyst Of Toe, Surgery Vas Deferens Vasovasostomy Bilateral, cyst foot  ? ?NON-GU PSH: Drain/inject Joint/bursa - 2014 ? ?  ? ?GU PMH: BPH w/LUTS, Benign prostatic  hyperplasia with urinary obstruction - 2016 ?Encounter for Prostate Cancer screening, Prostate cancer screening - 2016 ?Ureteral calculus, Calculus of ureter - 2016 ?Renal colic, Renal colic - 24128?  ? ?NON-GU PMH: Encounter for general adult medical examination without abnormal findings, Encounter for preventive health examination - 2016 ?Personal history of other specified conditions, History of heartburn - 2014 ?GERD ?Sleep Apnea ?  ? ?FAMILY HISTORY: 2 daughters - Other ?Death In The Family Father - Other ?Family Health Status Number - Father ?Kidney Stones - Other ?nephrolithiasis - Father  ? ?SOCIAL HISTORY: Marital Status: Married ?Preferred Language: [place] Ethnicity: Not Hispanic Or Latino; Race: White ?Current Smoking Status: Patient has never smoked.  ? ?Tobacco Use Assessment Completed: Used Tobacco in last 30 days? ?Does not use smokeless tobacco. ?Has never drank.  ?Does not use drugs. ?Drinks 2 caffeinated drinks per day. ?  ?  Notes: Alcohol Use, Never A Smoker, Occupation:, Marital History - Currently Married, Caffeine Use  ? ?REVIEW OF SYSTEMS:    ?GU Review Male:   Patient reports frequent urination, hard to postpone urination, get up at night to urinate, trouble starting your stream, have to strain to urinate , and erection problems. Patient denies burning/ pain with urination, leakage of urine, stream starts and stops, and penile pain.  ?Gastrointestinal (Upper):  Patient reports nausea. Patient denies vomiting and indigestion/ heartburn.  ?Gastrointestinal (Lower):   Patient reports diarrhea and constipation.   ?Constitutional:   Patient reports fatigue. Patient denies fever, night sweats, and weight loss.  ?Skin:   Patient denies skin rash/ lesion and itching.  ?Eyes:   Patient denies blurred vision and double vision.  ?Ears/ Nose/ Throat:   Patient denies sore throat and sinus problems.  ?Hematologic/Lymphatic:   Patient denies swollen glands and easy bruising.  ?Cardiovascular:   Patient  denies leg swelling and chest pains.  ?Respiratory:   Patient denies cough and shortness of breath.  ?Endocrine:   Patient denies excessive thirst.  ?Musculoskeletal:   Patient reports back pain. Patient denies joint pain.  ?Neurological:   Patient denies headaches and dizziness.  ?Psychologic:   Patient denies depression and anxiety.  ? ?Notes: Weak stream ?  ? ?VITAL SIGNS:    ?  09/12/2021 01:47 PM  ?Weight 190 lb / 86.18 kg  ?Height 72 in / 182.88 cm  ?BP 112/72 mmHg  ?Heart Rate 65 /min  ?Temperature 97.5 F / 36.3 C  ?BMI 25.8 kg/m?  ? ?MULTI-SYSTEM PHYSICAL EXAMINATION:    ?Constitutional: Well-nourished. No physical deformities. Normally developed. Good grooming.  ?Respiratory: No labored breathing, no use of accessory muscles.   ?Cardiovascular: Regular rate and rhythm. No murmur, no gallop.   ?Skin: No paleness, no jaundice, no cyanosis. No lesion, no ulcer, no rash.  ?Neurologic / Psychiatric: Oriented to time, oriented to place, oriented to person. No depression, no anxiety, no agitation.  ?Gastrointestinal: No mass, no tenderness, no rigidity, non obese abdomen.  ?Musculoskeletal: Normal gait and station of head and neck.  ? ?  ?Complexity of Data:  ?Lab Test Review:   BMP, CBC with Diff  ?Records Review:   AUA Symptom Score, Previous Patient Records  ?Urine Test Review:   Urinalysis  ?X-Ray Review: KUB: Reviewed Films. Discussed With Patient.  ?C.T. Stone Protocol: Reviewed Films. Reviewed Report. Discussed With Patient.  ?  ? 05/25/14 02/10/13  ?PSA  ?Total PSA 0.55  0.48   ? ? ?PROCEDURES:    ?     KUB - [id number]  ?A single view of the abdomen is obtained. the stone appears to have moved near the right UPJ overlying the ishial tuberosity. No bone, gas or soft tissue abnormalities are noted.   ?  ?  ?Patient confirmed No Neulasta OnPro Device. ? ?  ? ? ?     Urinalysis w/Scope ?Dipstick Dipstick Cont'd Micro  ?Color: Yellow Bilirubin: Neg mg/dL WBC/hpf: 0 - 5/hpf  ?Appearance: Clear Ketones: Neg mg/dL  RBC/hpf: 40 - 60/hpf  ?Specific Gravity: 1.025 Blood: 3+ ery/uL Bacteria: Few (10-25/hpf)  ?pH: 6.0 Protein: Neg mg/dL Cystals: NS (Not Seen)  ?Glucose: Neg mg/dL Urobilinogen: 0.2 mg/dL Casts: NS (Not Seen)  ?  Nitrites: Neg Trichomonas: Not Present  ?  Leukocyte Esterase: Neg leu/uL Mucous: Present  ?    Epithelial Cells: 0 - 5/hpf  ?    Yeast: NS (Not Seen)  ?    Sperm: Not Present  ? ? ?ASSESSMENT:  ?    ICD-10 Details  ?1 GU:   Ureteral calculus - N20.1 Acute, Systemic Symptoms - He has a right distal stone with urgency. His pain is reasonably control. I have recommended that he give it a few more days to try to pass. I will set him up next week for URS but he has had a stent in the past and would  like to avoid it again. I have reviewed the risks of ureteroscopy including bleeding, infection, ureteral injury, need for a stent or secondary procedures, thrombotic events and anesthetic complications.  ? ?Pain med refilled.   ?2   Ureteral obstruction secondary to calculous - N13.2 Acute, Systemic Symptoms  ?3   Acute kidney failure - N17.0 Minor - repeat BMP today.  ? ?PLAN:    ? ? ?      Medications ?New Meds: Oxycodone-Acetaminophen 5 mg-325 mg tablet 1 tablet PO Q 6 H PRN   #20  0 Refill(s)  ?Pharmacy Name:  [facility] #[id number]  ?Address:  [address]  ? [place], [place] [id number]  ?Phone:  [phone]  ?Fax:  [phone]  ?  ? ? ?      Orders ?Labs BMP  ?X-Rays: KUB  ? ? ?      Schedule ?Return Visit/Planned Activity: Next Available Appointment - Schedule Surgery  ? ? ?      Document ?Letter(s):  Created for Patient: Clinical Summary  ? ? ?     Notes:   Dr. Garret Reddish.   ? ?     Next Appointment:    ?  Next Appointment: 09/20/2021 11:00 AM  ?  Appointment Type: Surgery   ?  Location: [facility] [phone]  ?  Provider: Irine Seal, M.D.  ?  Reason for Visit: OP NE CYSTO RT RTG URS STENT HLL  ?  ? ? ?

## 2021-09-20 NOTE — Op Note (Signed)
Procedure: 1.  Cystoscopy with right retrograde pyelogram and interpretation. ?2.  Cystoscopy with dilation of right ureteral stricture. ?3.  Right ureteroscopy with holmium laser application, stone extraction and insertion of right double-J stent. ?4.  Application of fluoroscopy. ? ?Preop diagnosis: 5 mm right distal ureteral stone. ? ?Postop diagnosis: Same with right distal ureteral stricture. ? ?Surgeon: Dr. Irine Seal. ? ?Anesthesia: General. ? ?Specimen: Stone fragments. ? ?Drain: 6 [place] by 26 cm right contour double-J stent with tether. ? ?EBL: None. ? ?Complications: None. ? ?Indications: The patient is a 56 year old male with a 5 mm right distal ureteral stone with nonprogression of persistent symptoms he is elected ureteroscopy for management. ? ?Procedure: He was taken operating room where he was given Cipro.  A general anesthetic was induced.  He was placed in lithotomy position and fitted with PAS hose.  His perineum and genitalia were prepped with Betadine solution he was draped in usual sterile fashion. ? ?Cystoscopy was performed using a 21 [place] scope and 30 degree lens.  Examination revealed a normal urethra.  The external sphincter was intact.  The prostatic urethra was short without significant hyperplasia but a slightly elevated bladder neck.  Examination of bladder revealed a smooth wall without tumors, stones or inflammation.  Ureteral orifices were unremarkable. ? ?Right ureteral orifice was cannulated with a 5 [place] open-ended catheter and contrast was instilled. ? ?The right retrograde pyelogram demonstrated a very tight intramural ureter suggestive of a stricture with a filling defect just above this level and mild dilation of the more proximal ureter. ? ?A sensor wire was then advanced the kidney under fluoroscopic guidance and a 4 cm x 15 French high-pressure balloon was advanced over the wire across the area of narrowing.  The balloon was then inflated to 20 atm of pressure and  after fluoroscopy confirmed full dilation, the balloon was deflated and removed.  The cystoscope was removed. ? ?The 4.5 [place] short semirigid ureteroscope was then passed alongside the wire and examination revealed the disrupted stricture with some scar tissue that did suggest the had a stricture more than just a narrowing.  The stone was then identified and grasped with an engage basket but it was found to be too large to remove intact. ? ?The 365 ?m laser fiber was then passed through the scope and the stone was broken into manageable fragments with the laser set on 0.5 J and 53 Hz on the left pedal and 1 J and 20 Hz on the right pedal.  The low power setting was primarily used.  The stone fragments were then removed to the bladder with the engage basket. ? ?The ureteroscope was removed and the cystoscope was reinserted over the wire.  A 6 French by 26 cm contour double-J stent with tether was then advanced over the wire to the kidney under fluoroscopic guidance.  The wire was removed, leaving a good coil in the kidney and a good coil in the bladder.  The cystoscope was then removed and replaced over the string and the stone fragments were evacuated from the bladder.  The bladder was then partially drained and the cystoscope was removed.  The stent string was secured to the patient's penis.  The stone fragments were collected to give to the family to bring to the office.  He was taken down from lithotomy position, his anesthetic was reversed and he was moved to recovery in stable condition.  There were no complications. ?

## 2021-09-20 NOTE — Transfer of Care (Signed)
Immediate Anesthesia Transfer of Care Note ? ?Patient: Lucas Rocha ? ?Procedure(s) Performed: CYSTOSCOPY WITH RIGHT RETROGRADE PYELOGRAM, URETEROSCOPY AND STENT PLACEMENT POSSIBLE HOLMIUM LASER LITHOTRIPSY (Right: Ureter) ? ?Patient Location: PACU ? ?Anesthesia Type:General ? ?Level of Consciousness: drowsy ? ?Airway & Oxygen Therapy: Patient Spontanous Breathing ? ?Post-op Assessment: Report given to RN and Post -op Vital signs reviewed and stable ? ?Post vital signs: Reviewed and stable ? ?Last Vitals:  ?Vitals Value Taken Time  ?BP    ?Temp    ?Pulse 81 09/20/21 1109  ?Resp 15 09/20/21 1109  ?SpO2 96 % 09/20/21 1109  ?Vitals shown include unvalidated device data. ? ?Last Pain:  ?Vitals:  ? 09/20/21 0919  ?TempSrc: Oral  ?   ? ?  ? ?Complications: No notable events documented. ?

## 2021-09-20 NOTE — Anesthesia Postprocedure Evaluation (Signed)
Anesthesia Post Note ? ?Patient: Lucas Rocha ? ?Procedure(s) Performed: CYSTOSCOPY WITH RIGHT RETROGRADE PYELOGRAM, URETEROSCOPY AND STENT PLACEMENT POSSIBLE HOLMIUM LASER LITHOTRIPSY (Right: Ureter) ? ?  ? ?Patient location during evaluation: PACU ?Anesthesia Type: General ?Level of consciousness: awake and alert ?Pain management: pain level controlled ?Vital Signs Assessment: post-procedure vital signs reviewed and stable ?Respiratory status: spontaneous breathing, nonlabored ventilation, respiratory function stable and patient connected to nasal cannula oxygen ?Cardiovascular status: blood pressure returned to baseline and stable ?Postop Assessment: no apparent nausea or vomiting ?Anesthetic complications: no ? ? ?No notable events documented. ? ?Last Vitals:  ?Vitals:  ? 09/20/21 1130 09/20/21 1145  ?BP: 133/85 (!) 127/93  ?Pulse: 81 79  ?Resp: (!) 9 13  ?Temp:    ?SpO2: 98% 98%  ?  ?Last Pain:  ?Vitals:  ? 09/20/21 1115  ?TempSrc:   ?PainSc: 0-No pain  ? ? ?  ?  ?  ?  ?  ?  ? ?Bryan Omura S ? ? ? ? ?

## 2021-09-22 ENCOUNTER — Encounter (HOSPITAL_BASED_OUTPATIENT_CLINIC_OR_DEPARTMENT_OTHER): Payer: Self-pay | Admitting: Urology

## 2022-02-16 ENCOUNTER — Ambulatory Visit (INDEPENDENT_AMBULATORY_CARE_PROVIDER_SITE_OTHER): Payer: 59 | Admitting: Family Medicine

## 2022-02-16 ENCOUNTER — Encounter: Payer: Self-pay | Admitting: Family Medicine

## 2022-02-16 VITALS — BP 108/64 | HR 81 | Temp 98.0°F | Ht 72.0 in | Wt 196.0 lb

## 2022-02-16 DIAGNOSIS — Z23 Encounter for immunization: Secondary | ICD-10-CM | POA: Diagnosis not present

## 2022-02-16 DIAGNOSIS — Z125 Encounter for screening for malignant neoplasm of prostate: Secondary | ICD-10-CM

## 2022-02-16 DIAGNOSIS — E785 Hyperlipidemia, unspecified: Secondary | ICD-10-CM

## 2022-02-16 DIAGNOSIS — Z0000 Encounter for general adult medical examination without abnormal findings: Secondary | ICD-10-CM

## 2022-02-16 LAB — LIPID PANEL
Cholesterol: 133 mg/dL (ref 0–200)
HDL: 57.5 mg/dL (ref >39.00)
LDL Cholesterol: 58 mg/dL (ref 0–99)
NonHDL: 75.88
Total CHOL/HDL Ratio: 2
Triglycerides: 89 mg/dL (ref 0.0–149.0)
VLDL: 17.8 mg/dL (ref 0.0–40.0)

## 2022-02-16 LAB — CBC WITH DIFFERENTIAL/PLATELET
Basophils Absolute: 0 10*3/uL (ref 0.0–0.1)
Basophils Relative: 0.6 % (ref 0.0–3.0)
Eosinophils Absolute: 0.3 10*3/uL (ref 0.0–0.7)
Eosinophils Relative: 5.3 % — ABNORMAL HIGH (ref 0.0–5.0)
HCT: 43 % (ref 39.0–52.0)
Hemoglobin: 14.3 g/dL (ref 13.0–17.0)
Lymphocytes Relative: 33.8 % (ref 12.0–46.0)
Lymphs Abs: 1.7 10*3/uL (ref 0.7–4.0)
MCHC: 33.2 g/dL (ref 30.0–36.0)
MCV: 89.8 fl (ref 78.0–100.0)
Monocytes Absolute: 0.6 10*3/uL (ref 0.1–1.0)
Monocytes Relative: 11.5 % (ref 3.0–12.0)
Neutro Abs: 2.4 10*3/uL (ref 1.4–7.7)
Neutrophils Relative %: 48.8 % (ref 43.0–77.0)
Platelets: 240 10*3/uL (ref 150.0–400.0)
RBC: 4.79 Mil/uL (ref 4.22–5.81)
RDW: 13.7 % (ref 11.5–15.5)
WBC: 5 10*3/uL (ref 4.0–10.5)

## 2022-02-16 LAB — COMPREHENSIVE METABOLIC PANEL
ALT: 30 U/L (ref 0–53)
AST: 24 U/L (ref 0–37)
Albumin: 4 g/dL (ref 3.5–5.2)
Alkaline Phosphatase: 97 U/L (ref 39–117)
BUN: 14 mg/dL (ref 6–23)
CO2: 31 mEq/L (ref 19–32)
Calcium: 9.3 mg/dL (ref 8.4–10.5)
Chloride: 105 mEq/L (ref 96–112)
Creatinine, Ser: 0.91 mg/dL (ref 0.40–1.50)
GFR: 95.72 mL/min (ref >60.00)
Glucose, Bld: 74 mg/dL (ref 70–99)
Potassium: 4.3 mEq/L (ref 3.5–5.1)
Sodium: 140 mEq/L (ref 135–145)
Total Bilirubin: 0.5 mg/dL (ref 0.2–1.2)
Total Protein: 6.9 g/dL (ref 6.0–8.3)

## 2022-02-16 MED ORDER — SILDENAFIL CITRATE 100 MG PO TABS: 100.0000 mg | ORAL_TABLET | Freq: Every day | ORAL | 11 refills | Status: AC | PRN

## 2022-02-16 NOTE — Patient Instructions (Addendum)
Send [place] COVID dates via Spring.  Flu shot today and Tdap today  Please stop by lab before you go If you have mychart- we will send your results within 3 business days of [place] receiving them.  If you do not have mychart- we will call you about results within 5 business days of [place] receiving them.  *please also note that you will see labs on mychart as soon as they post. I will later go in and write notes on them- will say "notes from Dr. Yong Channel"    Recommended follow up: Return in about 1 year (around 02/17/2023) for physical or sooner if needed.Schedule b4 you leave.

## 2022-02-16 NOTE — Addendum Note (Signed)
Addended by: Clyde Lundborg A on: 02/16/2022 08:49 AM   Modules accepted: Orders

## 2022-02-16 NOTE — Progress Notes (Signed)
Phone: [phone]   Subjective:  Patient presents today for their annual physical. Chief complaint-noted.   See problem oriented charting- ROS- full  review of systems was completed and negative  Per full ROS sheet completed by patient  The following were reviewed and entered/updated in epic: Past Medical History:  Diagnosis Date   Allergic rhinitis    BPH (benign prostatic hyperplasia)    Coronary artery calcification seen on CAT scan    05-03-2021  calcium score = 54.9, all located LAD/ D1  (cardiologist-- dr Cathie Olden)   ED (erectile dysfunction)    GERD (gastroesophageal reflux disease)    Hiatal hernia    History of iron deficiency anemia    History of kidney stones    Hyperlipidemia    Lower urinary tract symptoms (LUTS)    Nephrolithiasis    CT 09-11-2021  nonobstructive right renal calculi   OSA on CPAP    RBBB (right bundle branch block)    Right ureteral stone    Patient Active Problem List   Diagnosis Date Noted   nonobstructive CAD (coronary artery disease) 06/01/2021    Priority: High   Aortic atherosclerosis ([place]) 06/01/2021    Priority: Medium    Hyperlipidemia, unspecified 06/01/2021    Priority: Medium    BPH (benign prostatic hyperplasia)     Priority: Medium    G E R D 12/28/2006    Priority: Medium    Erectile dysfunction 09/17/2017    Priority: Low   OSA (obstructive sleep apnea)     Priority: Low   Ureterolithiasis 02/07/2013    Priority: Low   Gastric polyp 12/12/2011    Priority: Low   Diverticulosis 12/07/2011    Priority: Low   Hx of adenomatous colonic polyps 12/07/2011    Priority: Low   Iron deficiency anemia 10/24/2011    Priority: Low   Allergic rhinitis 01/01/2007    Priority: Low   Past Surgical History:  Procedure Laterality Date   COLONOSCOPY WITH PROPOFOL  05/24/2018   by dr Hilarie Fredrickson   CYSTOSCOPY WITH RETROGRADE PYELOGRAM, URETEROSCOPY AND STENT PLACEMENT Right 05/14/2013   Procedure: [place], URETEROSCOPY AND STENT PLACEMENT;  Surgeon: Alexis Frock, MD;  Location[facility];  Service: Urology;  Laterality: Right;   CYSTOSCOPY WITH RETROGRADE PYELOGRAM, URETEROSCOPY AND STENT PLACEMENT Right 09/20/2021   Procedure: CYSTOSCOPY WITH RIGHT RETROGRADE PYELOGRAM, URETEROSCOPY AND STENT PLACEMENT POSSIBLE HOLMIUM LASER LITHOTRIPSY;  Surgeon: Irine Seal, MD;  Location: [facility];  Service: Urology;  Laterality: Right;   ESOPHAGOGASTRODUODENOSCOPY  11/28/2011   by dr pyrtle   [place] LITHOTRIPSY Right 02/13/2013   '@[facility]'$    FOOT GANGLION EXCISION Left 1999   HOLMIUM LASER APPLICATION Right 79/06/4095   Procedure: HOLMIUM LASER APPLICATION;  Surgeon: Alexis Frock, MD;  Location: [facility];  Service: Urology;  Laterality: Right;    Family History  Problem Relation Age of Onset   Arthritis Mother    Cancer Father        unknown- not worked up 76   Diabetes Maternal Grandfather    Heart disease Other        Dad's side - grandfather, grandmother (big MI)   Colon cancer Neg Hx    Colon polyps Neg Hx    Esophageal cancer Neg Hx    Stomach cancer Neg Hx    Rectal cancer Neg Hx     Medications- reviewed and updated Current Outpatient Medications  Medication Sig Dispense Refill  alfuzosin (UROXATRAL) 10 MG 24 hr tablet TAKE 1 TABLET BY MOUTH  DAILY WITH BREAKFAST (Patient taking differently: 10 mg daily with breakfast.) 90 tablet 3   aspirin EC 81 MG tablet Take 1 tablet (81 mg total) by mouth daily. Swallow whole. 90 tablet 3   fexofenadine (ALLEGRA) 60 MG tablet Take 60 mg by mouth as needed.      nitroGLYCERIN (NITROSTAT) 0.4 MG SL tablet Place 1 tablet (0.4 mg total) under the tongue every 5 (five) minutes as needed for chest pain. 25 tablet 3   omeprazole (PRILOSEC) 20 MG capsule TAKE 1 CAPSULE BY MOUTH  DAILY (Patient taking differently: 20 mg daily.) 90 capsule 3   rosuvastatin (CRESTOR) 5 MG tablet  Take 1 tablet (5 mg total) by mouth daily. (Patient taking differently: Take 5 mg by mouth daily.) 90 tablet 3   sildenafil (VIAGRA) 100 MG tablet Take 1 tablet (100 mg total) by mouth daily as needed for erectile dysfunction. (Patient taking differently: Take 100 mg by mouth daily as needed for erectile dysfunction.) 10 tablet 11   No current facility-administered medications for this visit.    Allergies-reviewed and updated Allergies  Allergen Reactions   Penicillins Hives     AND HYPERACTIVITY   Sulfa Antibiotics Hives    CHILDHOOD REACTION    Social History   Social History Narrative   Married. 2 daughters. 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 Oropharynx normal Neck: no thyromegaly CV: RRR no murmurs rubs or gallops Lungs: CTAB no crackles, wheeze, rhonchi Abdomen: soft/nontender/nondistended/normal bowel sounds. No rebound or guarding.  Ext: no edema Skin: warm, dry Neuro: grossly normal, moves all extremities, PERRLA    Assessment and Plan  56 y.o. male presenting for annual physical.  Health Maintenance counseling: 1. Anticipatory guidance: Patient counseled regarding regular dental exams -q6 months, eye exams -yearly,  avoiding smoking and second hand smoke , limiting alcohol to 2 beverages per day - maybe 2 a year, no illicit drugs .   2. Risk factor reduction:  Advised patient of need for regular exercise and diet rich and fruits and vegetables to reduce risk of heart attack and stroke.  Exercise-  walking dog regularly a mile each day- encouraged to bump to 2 miles.  Diet/weight management-weight up 4 pounds from last year- weight stability or mild weight loss recommended.  Wt Readings from Last 3 Encounters:  02/16/22 196 lb (88.9 kg)  09/20/21 183 lb 12.8 oz (83.4 kg)  09/11/21 196 lb 10.4 oz (89.2 kg)  3. Immunizations/screenings/ancillary studies-Tdap and flu shot recommended today, discussed COVID-19 vaccination- opts out and will discontinue reminders- prefers to not take Immunization History  Administered Date(s) Administered   Influenza Split 02/26/2013   Influenza,inj,Quad PF,6+ Mos 01/28/2019   Influenza-Unspecified 02/16/2016, 01/27/2018   Tdap 10/24/2011   Zoster Recombinat (Shingrix) 01/28/2019, 05/15/2019   4. Prostate cancer screening- low risk prior PSA trend-continue to trend due to  high risk employment history Lab Results  Component Value Date   PSA 0.39 02/10/2021   PSA 0.34 02/10/2020   PSA 0.41 01/28/2019   5. Colon cancer screening - History of adenomatous polyps of colon-July 2013 but no polyps 05/24/2018 with 10-year follow-up planned 6. Skin cancer screening- has derm visit upcoming. advised regular sunscreen use. Denies worrisome, changing, or new skin lesions-other than skin tags 7. Smoking associated screening (lung cancer screening, AAA screen 65-75, UA)- never smoker. UA with urology 8. STD screening - opts out as monogamous  Status of chronic or acute concerns   #Memory loss- mentioned last year- about 2 years of issues now -forgets smaller recent details -mini cog normal last year and again today -b12 was high last year and not low -tsh was normal -declined rpr/hiv testing last year -declines MRI brain -could be stress related- will schedule follow up if worsens prior to 1 year  #Nonobstructive CAD-has seen Dr. Katharina Caper 07/19/2021  #hyperlipidemia #Aortic atherosclerosis S: Medication:rosuvastatin 5 mg daily, aspirin 81 mg-- he has been off  and advised to restart, has nitroglycerin available if needed -In 2022-coronary artery calcium score was 187 which was 90th percentile -n ochest pain or shortness of breath Lab Results  Component Value Date   CHOL 129 04/18/2021   HDL 52 04/18/2021   [place] 58 04/18/2021   TRIG 105 04/18/2021   CHOLHDL 2.5 04/18/2021    A/P: Nonobstructive CAD- asymptomatic- continue current meds and cardiology follow up  Hyperlipidemia- likely at goal- update lipids- continue current meds Aortic atherosclerosis- LDL goal 70 or less- continue risk factor modificatoin   #BPH S: Medication: Alfuzosin 10 mg extended release  A/P: reasonable control as long as doesn't drink too many fluids- continue current meds    # GERD S:Medication: Omeprazole 20 mg B12 levels related to PPI use: Elevated in 2022 A/P: controlled- he may want to trial a digestive blend - friend recommended- thinks twice daily pepcid would be hard   #Erectile dysfunction-sildenafil available if needed- refill today  -if ever uses sildenafil should not use nitro within 24 hours- advised today  #Nephrolithiasis -managed by Dr. Jeffie Pollock with [facility] - most recent removal in April  2023   Recommended follow up: Return in about 1 year (around 02/17/2023) for physical or sooner if needed.Schedule b4 you leave.  Lab/Order associations:NOT fasting- protein bar only   ICD-10-CM   1. Preventative health care  Z00.00     2. Hyperlipidemia, unspecified hyperlipidemia type  E78.5     3. Screening for prostate cancer  Z12.5      No orders of the defined types were placed in this encounter.   Return precautions advised.  Garret Reddish, MD

## 2022-02-17 LAB — PSA: PSA: 0.28 ng/mL (ref 0.10–4.00)

## 2022-02-23 ENCOUNTER — Other Ambulatory Visit: Payer: Self-pay | Admitting: Family Medicine

## 2022-04-21 ENCOUNTER — Other Ambulatory Visit: Payer: Self-pay | Admitting: Family Medicine

## 2022-05-12 ENCOUNTER — Ambulatory Visit: Payer: 59 | Admitting: Family Medicine

## 2022-05-12 ENCOUNTER — Telehealth: Payer: Self-pay | Admitting: Family Medicine

## 2022-05-12 VITALS — BP 110/73 | HR 118 | Temp 100.9°F | Wt 185.4 lb

## 2022-05-12 DIAGNOSIS — U071 COVID-19: Secondary | ICD-10-CM | POA: Diagnosis not present

## 2022-05-12 DIAGNOSIS — J3489 Other specified disorders of nose and nasal sinuses: Secondary | ICD-10-CM

## 2022-05-12 LAB — POCT INFLUENZA A/B
Influenza A, POC: NEGATIVE
Influenza B, POC: NEGATIVE

## 2022-05-12 LAB — POC COVID19 BINAXNOW: SARS Coronavirus 2 Ag: POSITIVE — AB

## 2022-05-12 MED ORDER — MOLNUPIRAVIR EUA 200MG CAPSULE: 4.0000 | ORAL_CAPSULE | Freq: Two times a day (BID) | ORAL | 0 refills | Status: AC

## 2022-05-12 MED ORDER — AZITHROMYCIN 250 MG PO TABS: ORAL_TABLET | ORAL | 0 refills | Status: AC

## 2022-05-12 NOTE — Telephone Encounter (Signed)
Vml for pt to call and sch   Client [place] [facility] [place] Client Site [place] [facility] [place] Night Provider Garret Reddish- MD Contact Type Call Who Is Calling Patient / Member / Family / Caregiver Caller Name Fatih Stalvey Caller Phone Number [phone] Patient Name Lucas Rocha Patient DOB 02/02/1966 Call Type Message Only Information Provided Reason for Call Request to Schedule Office Appointment Initial Comment Caller states he wants to make an appointment today for a sinus infection that is not getting any better. Patient request to speak to RN No Additional Comment Please call as soon as possible.

## 2022-05-12 NOTE — Progress Notes (Signed)
Lucas Rocha , 26-Nov-1965, 56 y.o., male MRN: [id number] Patient Care Team    Relationship Specialty Notifications Start End  Marin Olp, MD PCP - General Family Medicine  01/19/16   Nahser, Wonda Cheng, MD PCP - Cardiology Cardiology  04/18/21     Chief Complaint  Patient presents with   Facial Pain    Pressure. Had a sinus infection a few weeks ago. Amoxicillin gave GI issue.     Subjective: Pt presents for an OV with complaints of facial sinus pain and generally feels bad, onset 2 days ago. He reports he had a a sinus infection about 3 weeks ago. He was prescribed abx, which gave him diarrhea. He felt improvement until 2 days ago.  He has had covid immunizations.      02/16/2022    7:55 AM 02/10/2021    8:23 AM 12/02/2019    9:21 AM 01/28/2019    9:32 AM 09/17/2017    1:20 PM  Depression screen PHQ 2/9  Decreased Interest 0 0 0 0 0  Down, Depressed, Hopeless 0 0 0 0 0  PHQ - 2 Score 0 0 0 0 0  Altered sleeping 0  0    Tired, decreased energy 0  0    Change in appetite 0  0    Feeling bad or failure about yourself  0  0    Trouble concentrating 0  0    Moving slowly or fidgety/restless 0  0    Suicidal thoughts 0  0    PHQ-9 Score 0  0    Difficult doing work/chores Not difficult at all  Not difficult at all      Allergies  Allergen Reactions   Penicillins Hives     AND HYPERACTIVITY   Sulfa Antibiotics Hives    CHILDHOOD REACTION   Social History   Social History Narrative   Married. 2 daughters. 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       September 2023- [employer] - going to work on relationship with Research officer, trade union as well as various regulations/etc   Retired by 2023- prior [employer] for [place]: workaholic    Past Medical History:  Diagnosis Date   Allergic rhinitis    BPH (benign prostatic hyperplasia)    Coronary artery calcification seen on CAT scan     05-03-2021  calcium score = 54.9, all located LAD/ D1  (cardiologist-- dr Cathie Olden)   ED (erectile dysfunction)    GERD (gastroesophageal reflux disease)    Hiatal hernia    History of iron deficiency anemia    History of kidney stones    Hyperlipidemia    Lower urinary tract symptoms (LUTS)    Nephrolithiasis    CT 09-11-2021  nonobstructive right renal calculi   OSA on CPAP    RBBB (right bundle branch block)    Right ureteral stone    Past Surgical History:  Procedure Laterality Date   COLONOSCOPY WITH PROPOFOL  05/24/2018   by dr Hilarie Fredrickson   CYSTOSCOPY WITH RETROGRADE PYELOGRAM, URETEROSCOPY AND STENT PLACEMENT Right 05/14/2013   Procedure: [place], URETEROSCOPY AND STENT PLACEMENT;  Surgeon: Alexis Frock, MD;  Location: [facility];  Service: Urology;  Laterality: Right;   CYSTOSCOPY WITH RETROGRADE PYELOGRAM, URETEROSCOPY AND STENT PLACEMENT Right 09/20/2021   Procedure: CYSTOSCOPY WITH RIGHT RETROGRADE PYELOGRAM, URETEROSCOPY AND STENT PLACEMENT POSSIBLE HOLMIUM  LASER LITHOTRIPSY;  Surgeon: Irine Seal, MD;  Location: [facility];  Service: Urology;  Laterality: Right;   ESOPHAGOGASTRODUODENOSCOPY  11/28/2011   by dr pyrtle   [place] LITHOTRIPSY Right 02/13/2013   '@[facility]'$    FOOT GANGLION EXCISION Left 1999   HOLMIUM LASER APPLICATION Right 99/83/3825   Procedure: HOLMIUM LASER APPLICATION;  Surgeon: Alexis Frock, MD;  Location[facility];  Service: Urology;  Laterality: Right;   Family History  Problem Relation Age of Onset   Arthritis Mother    Cancer Father        unknown- not worked up 76   Diabetes Maternal Grandfather    Heart disease Other        Dad's side - grandfather, grandmother (big MI)   Colon cancer Neg Hx    Colon polyps Neg Hx    Esophageal cancer Neg Hx    Stomach cancer Neg Hx    Rectal cancer Neg Hx    Allergies as of 05/12/2022       Reactions   Penicillins  Hives    AND HYPERACTIVITY   Sulfa Antibiotics Hives   CHILDHOOD REACTION        Medication List        Accurate as of May 12, 2022  3:39 PM. If you have any questions, ask your nurse or doctor.          STOP taking these medications    nitroGLYCERIN 0.4 MG SL tablet Commonly known as: NITROSTAT Stopped by: Howard Pouch, DO       TAKE these medications    alfuzosin 10 MG 24 hr tablet Commonly known as: UROXATRAL TAKE 1 TABLET BY MOUTH  DAILY WITH BREAKFAST What changed: how to take this   aspirin EC 81 MG tablet Take 1 tablet (81 mg total) by mouth daily. Swallow whole.   azithromycin 250 MG tablet Commonly known as: ZITHROMAX Take 2 tablets on day 1, then 1 tablet daily on days 2 through 5 Started by: Howard Pouch, DO   fexofenadine 60 MG tablet Commonly known as: ALLEGRA Take 60 mg by mouth as needed.   molnupiravir EUA 200 mg Caps capsule Commonly known as: LAGEVRIO Take 4 capsules (800 mg total) by mouth 2 (two) times daily for 5 days. Started by: Howard Pouch, DO   omeprazole 20 MG capsule Commonly known as: PRILOSEC TAKE 1 CAPSULE BY MOUTH DAILY   rosuvastatin 5 MG tablet Commonly known as: CRESTOR TAKE 1 TABLET BY MOUTH DAILY   sildenafil 100 MG tablet Commonly known as: Viagra Take 1 tablet (100 mg total) by mouth daily as needed for erectile dysfunction.        All past medical history, surgical history, allergies, family history, immunizations andmedications were updated in the EMR today and reviewed under the history and medication portions of their EMR.     Review of Systems  Constitutional:  Positive for chills, fever and malaise/fatigue.  HENT:  Positive for congestion and sinus pain. Negative for ear discharge, ear pain, nosebleeds and sore throat.   Eyes:  Negative for pain, discharge and redness.  Respiratory:  Negative for cough, sputum production, shortness of breath and wheezing.   Gastrointestinal:  Positive for  diarrhea. Negative for abdominal pain, blood in stool, constipation, nausea and vomiting.  Skin:  Negative for rash.  Neurological:  Negative for dizziness and headaches.   Negative, with the exception of above mentioned in HPI   Objective:  BP 110/73  Pulse (!) 118   Temp (!) 100.9 F (38.3 C)   Wt 185 lb 6.4 oz (84.1 kg)   SpO2 95%   BMI 25.14 kg/m  Body mass index is 25.14 kg/m. Physical Exam Vitals and nursing note reviewed.  Constitutional:      General: He is not in acute distress.    Appearance: Normal appearance. He is not ill-appearing, toxic-appearing or diaphoretic.  HENT:     Head: Normocephalic and atraumatic.     Right Ear: Tympanic membrane and ear canal normal.     Left Ear: Tympanic membrane and ear canal normal.     Nose: Congestion and rhinorrhea present.     Mouth/Throat:     Mouth: Mucous membranes are moist.     Pharynx: No oropharyngeal exudate or posterior oropharyngeal erythema.  Eyes:     General: No scleral icterus.       Right eye: No discharge.        Left eye: No discharge.     Extraocular Movements: Extraocular movements intact.     Pupils: Pupils are equal, round, and reactive to light.  Cardiovascular:     Rate and Rhythm: Regular rhythm. Tachycardia present.  Pulmonary:     Effort: Pulmonary effort is normal. No respiratory distress.     Breath sounds: Normal breath sounds. No wheezing, rhonchi or rales.  Musculoskeletal:     Cervical back: Neck supple.  Lymphadenopathy:     Cervical: No cervical adenopathy.  Skin:    General: Skin is warm and dry.     Coloration: Skin is not jaundiced or pale.     Findings: No rash.  Neurological:     Mental Status: He is alert and oriented to person, place, and time. Mental status is at baseline.  Psychiatric:        Mood and Affect: Mood normal.        Behavior: Behavior normal.        Thought Content: Thought content normal.        Judgment: Judgment normal.      No results found. No  results found. Results for orders placed or performed in visit on 05/12/22 (from the past 24 hour(s))  POCT Influenza A/B     Status: None   Collection Time: 05/12/22  2:50 PM  Result Value Ref Range   Influenza A, POC Negative Negative   Influenza B, POC Negative Negative  POC COVID-19 BinaxNow     Status: Abnormal   Collection Time: 05/12/22  2:50 PM  Result Value Ref Range   SARS Coronavirus 2 Ag Positive (A) Negative    Assessment/Plan: SKYY MCKNIGHT is a 56 y.o. male present for OV for  Sinus pain/covid 19 Rest, hydrate.  +/- flonase, mucinex (DM if cough), nettie pot or nasal saline.  Molnupiravir  prescribed, take until completed.  azithromycin F/U 2 weeks if not improved.  Reviewed home care instructions for COVID. Advised self-isolation at home for at least 5 days. After 5 days, if improved and fever resolved, can be in public, but should wear a mask around others for an additional 5 days. If symptoms, esp, dyspnea develops/worsens, recommend in-person evaluation at either an urgent care or the emergency room.  Reviewed expectations re: course of current medical issues. Discussed self-management of symptoms. Outlined signs and symptoms indicating need for more acute intervention. Patient verbalized understanding and all questions were answered. Patient received an After-Visit Summary.    Orders Placed This Encounter  Procedures  POCT Influenza A/B   POC COVID-19 BinaxNow   Meds ordered this encounter  Medications   molnupiravir EUA (LAGEVRIO) 200 mg CAPS capsule    Sig: Take 4 capsules (800 mg total) by mouth 2 (two) times daily for 5 days.    Dispense:  40 capsule    Refill:  0   azithromycin (ZITHROMAX) 250 MG tablet    Sig: Take 2 tablets on day 1, then 1 tablet daily on days 2 through 5    Dispense:  6 tablet    Refill:  0   Referral Orders  No referral(s) requested today     Note is dictated utilizing voice recognition software. Although note has  been proof read prior to signing, occasional typographical errors still can be missed. If any questions arise, please do not hesitate to call for verification.   electronically signed by:  Howard Pouch, DO  [place]

## 2022-05-12 NOTE — Patient Instructions (Addendum)
No follow-ups on file.        Great to see you today.   COVID-19 COVID-19, or coronavirus disease 2019, is an infection that is caused by a new (novel) coronavirus called SARS-CoV-2. COVID-19 can cause many symptoms. In some people, the virus may not cause any symptoms. In others, it may cause mild or severe symptoms. Some people with severe infection develop severe disease. What are the causes? This illness is caused by a virus. The virus may be in the air as tiny specks of fluid (aerosols) or droplets, or it may be on surfaces. You may catch the virus by: Breathing in droplets from an infected person. Droplets can be spread by a person breathing, speaking, singing, coughing, or sneezing. Touching something, like a table or a doorknob, that has virus on it (is contaminated) and then touching your mouth, nose, or eyes. What increases the risk? Risk for infection: You are more likely to get infected with the COVID-19 virus if: You are within 6 ft (1.8 m) of a person with COVID-19 for 15 minutes or longer. You are providing care for a person who is infected with COVID-19. You are in close personal contact with other people. Close personal contact includes hugging, kissing, or sharing eating or drinking utensils. Risk for serious illness caused by COVID-19: You are more likely to get seriously ill from the COVID-19 virus if: You have cancer. You have a long-term (chronic) disease, such as: Chronic lung disease. This includes pulmonary embolism, chronic obstructive pulmonary disease, and cystic fibrosis. Long-term disease that lowers your body's ability to fight infection (immunocompromise). Serious cardiac conditions, such as heart failure, coronary artery disease, or cardiomyopathy. Diabetes. Chronic kidney disease. Liver diseases. These include cirrhosis, nonalcoholic fatty liver disease, alcoholic liver disease, or autoimmune hepatitis. You have obesity. You are pregnant or were  recently pregnant. You have sickle cell disease. What are the signs or symptoms? Symptoms of this condition can range from mild to severe. Symptoms may appear any time from 2 to 14 days after being exposed to the virus. They include: Fever or chills. Shortness of breath or trouble breathing. Feeling tired or very tired. Headaches, body aches, or muscle aches. Runny or stuffy nose, sneezing, coughing, or sore throat. New loss of taste or smell. This is rare. Some people may also have stomach problems, such as nausea, vomiting, or diarrhea. Other people may not have any symptoms of COVID-19. How is this diagnosed? This condition may be diagnosed by testing samples to check for the COVID-19 virus. The most common tests are the PCR test and the antigen test. Tests may be done in the lab or at home. They include: Using a swab to take a sample of fluid from the back of your nose and throat (nasopharyngeal fluid), from your nose, or from your throat. Testing a sample of saliva from your mouth. Testing a sample of coughed-up mucus from your lungs (sputum). How is this treated? Treatment for COVID-19 infection depends on the severity of the condition. Mild symptoms can be managed at home with rest, fluids, and over-the-counter medicines. Serious symptoms may be treated in a hospital intensive care unit (ICU). Treatment in the ICU may include: Supplemental oxygen. Extra oxygen is given through a tube in the nose, a face mask, or a hood. Medicines. These may include: Antivirals, such as monoclonal antibodies. These help your body fight off certain viruses that can cause disease. Anti-inflammatories, such as corticosteroids. These reduce inflammation and suppress the immune system. Antithrombotics.  These prevent or treat blood clots, if they develop. Convalescent plasma. This helps boost your immune system, if you have an underlying immunosuppressive condition or are getting immunosuppressive  treatments. Prone positioning. This means you will lie on your stomach. This helps oxygen to get into your lungs. Infection control measures. If you are at risk for more serious illness caused by COVID-19, your health care provider may prescribe two long-acting monoclonal antibodies, given together every 6 months. How is this prevented? To protect yourself: Use preventive medicine (pre-exposure prophylaxis). You may get pre-exposure prophylaxis if you have moderate or severe immunocompromise. Get vaccinated. Anyone 49 months old or older who meets guidelines can get a COVID-19 vaccine or vaccine series. This includes people who are pregnant or making breast milk (lactating). Get an added dose of COVID-19 vaccine after your first vaccine or vaccine series if you have moderate to severe immunocompromise. This applies if you have had a solid organ transplant or have been diagnosed with an immunocompromising condition. You should get the added dose 4 weeks after you got the first COVID-19 vaccine or vaccine series. If you get an mRNA vaccine, you will need a 3-dose primary series. If you get the J&J/Janssen vaccine, you will need a 2-dose primary series, with the second dose being an mRNA vaccine. Talk to your health care provider about getting experimental monoclonal antibodies. This treatment is approved under emergency use authorization to prevent severe illness before or after being exposed to the COVID-19 virus. You may be given monoclonal antibodies if: You have moderate or severe immunocompromise. This includes treatments that lower your immune response. People with immunocompromise may not develop protection against COVID-19 when they are vaccinated. You cannot be vaccinated. You may not get a vaccine if you have a severe allergic reaction to the vaccine or its components. You are not fully vaccinated. You are in a facility where COVID-19 is present and: Are in close contact with a person who is  infected with the COVID-19 virus. Are at high risk of being exposed to the COVID-19 virus. You are at risk of illness from new variants of the COVID-19 virus. To protect others: If you have symptoms of COVID-19, take steps to prevent the virus from spreading to others. Stay home. Leave your house only to get medical care. Do not use public transit, if possible. Do not travel while you are sick. Wash your hands often with soap and water for at least 20 seconds. If soap and water are not available, use alcohol-based hand sanitizer. Make sure that all people in your household wash their hands well and often. Cough or sneeze into a tissue or your sleeve or elbow. Do not cough or sneeze into your hand or into the air. Where to find more information Centers for Disease Control and Prevention: [employer]armC[employer].be World Health Organization: https://www.castaneda.info/ Get help right away if: You have trouble breathing. You have pain or pressure in your chest. You are confused. You have bluish lips and fingernails. You have trouble waking from sleep. You have symptoms that get worse. These symptoms may be an emergency. Get help right away. Call 911. Do not wait to see if the symptoms will go away. Do not drive yourself to the hospital. Summary COVID-19 is an infection that is caused by a new coronavirus. Sometimes, there are no symptoms. Other times, symptoms range from mild to severe. Some people with a severe COVID-19 infection develop severe disease. The virus that causes COVID-19 can spread from person to person  through droplets or aerosols from breathing, speaking, singing, coughing, or sneezing. Mild symptoms of COVID-19 can be managed at home with rest, fluids, and over-the-counter medicines. This information is not intended to replace advice given to you by your health care provider. Make sure you discuss any questions you have with your health care provider. Document  Revised: 05/03/2021 Document Reviewed: 05/05/2021 Elsevier Patient Education  [place].

## 2022-07-14 ENCOUNTER — Other Ambulatory Visit: Payer: Self-pay | Admitting: Family Medicine

## 2022-07-23 ENCOUNTER — Encounter: Payer: Self-pay | Admitting: Cardiovascular Disease

## 2022-07-23 NOTE — Progress Notes (Unsigned)
Cardiology Office Note:    Date:  07/24/2022   ID:  Lucas Rocha, DOB 11/20/1965, MRN [id number]  PCP:  Marin Olp, MD   [facility] Providers Cardiologist:  Trisha Ken    Referring MD: Marin Olp, MD   Chief Complaint  Patient presents with   Chest Pain     Nov. 21, 2022     Lucas Rocha is a 57 y.o. male with a hx of  chest pain . We were asked by Dr. Yong Channel to see him for further evaluation of his chest pain .  ( Husband of Lucas Rocha )   Lucas Rocha is seen for CP . [place] like CP  Occurs now and then , off and on through the day  Might last an hour or so  Not related to eating or drinkig, twisting his torso  No exertional , Not much exercise recently  Eats an unrestricted diet - Is on omeprazole .   Coronary calcium score of 187 Agatston units.  ( All located in the LAD ) This was 90th percentile for age-, race-, and sex-matched controls LDL is 100 - was just started on rosuvastatin   Feb. 21, 2023 Lucas Rocha is seen for follow up of his CP  Coronary CT angio showed a CAC of 54.9 He had minimal plaque in his LAD . Lipids from Nov. 2022 look good Not as much exercise  Walks the dog on occasion   Feb. 26, 2024; Lucas Rocha is seen for follow up of his CP  Coronary CT angio showed a CAC of 54.9 (90th percentile for age/ sex matched controls  He had minimal plaque in his LAD . Lipids from Nov. 2022 look good  Is retired from Teacher, music  Now working with Warehouse manager       Past Medical History:  Diagnosis Date   Allergic rhinitis    BPH (benign prostatic hyperplasia)    Coronary artery calcification seen on CAT scan    05-03-2021  calcium score = 54.9, all located LAD/ D1  (cardiologist-- dr Cathie Olden)   ED (erectile dysfunction)    GERD (gastroesophageal reflux disease)    Hiatal hernia    History of iron deficiency anemia    History of kidney stones    Hyperlipidemia    Lower urinary tract symptoms (LUTS)    Nephrolithiasis    CT  09-11-2021  nonobstructive right renal calculi   OSA on CPAP    RBBB (right bundle branch block)    Right ureteral stone     Past Surgical History:  Procedure Laterality Date   COLONOSCOPY WITH PROPOFOL  05/24/2018   by dr Hilarie Fredrickson   CYSTOSCOPY WITH RETROGRADE PYELOGRAM, URETEROSCOPY AND STENT PLACEMENT Right 05/14/2013   Procedure: [place], URETEROSCOPY AND STENT PLACEMENT;  Surgeon: Alexis Frock, MD;  Location: [facility];  Service: Urology;  Laterality: Right;   CYSTOSCOPY WITH RETROGRADE PYELOGRAM, URETEROSCOPY AND STENT PLACEMENT Right 09/20/2021   Procedure: CYSTOSCOPY WITH RIGHT RETROGRADE PYELOGRAM, URETEROSCOPY AND STENT PLACEMENT POSSIBLE HOLMIUM LASER LITHOTRIPSY;  Surgeon: Irine Seal, MD;  Location: [facility];  Service: Urology;  Laterality: Right;   ESOPHAGOGASTRODUODENOSCOPY  11/28/2011   by dr pyrtle   [place] LITHOTRIPSY Right 02/13/2013   '@[facility]'$    FOOT GANGLION EXCISION Left 1999   HOLMIUM LASER APPLICATION Right [id number]   Procedure: HOLMIUM LASER APPLICATION;  Surgeon: Alexis Frock, MD;  Location: [facility];  Service: Urology;  Laterality: Right;  Current Medications: Current Meds  Medication Sig   alfuzosin (UROXATRAL) 10 MG 24 hr tablet TAKE 1 TABLET BY MOUTH DAILY  WITH BREAKFAST   aspirin EC 81 MG tablet Take 1 tablet (81 mg total) by mouth daily. Swallow whole.   fexofenadine (ALLEGRA) 60 MG tablet Take 60 mg by mouth as needed.    omeprazole (PRILOSEC) 20 MG capsule TAKE 1 CAPSULE BY MOUTH DAILY   rosuvastatin (CRESTOR) 5 MG tablet TAKE 1 TABLET BY MOUTH DAILY   sildenafil (VIAGRA) 100 MG tablet Take 1 tablet (100 mg total) by mouth daily as needed for erectile dysfunction.     Allergies:   Penicillins and Sulfa antibiotics   Social History   Socioeconomic History   Marital status: Married    Spouse name: Not on file   Number of children: 2   Years of  education: Not on file   Highest education level: Bachelor's degree (e.g., BA, AB, BS)  Occupational History   Occupation: Actor: [employer]  Tobacco Use   Smoking status: Never   Smokeless tobacco: Never  Vaping Use   Vaping Use: Never used  Substance and Sexual Activity   Alcohol use: Yes    Comment: rare   Drug use: No   Sexual activity: Not on file    Comment: vasectomy  Other Topics Concern   Not on file  Social History Narrative   Married. 2 daughters. 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       September 2023- [employer] - going to work on relationship with Research officer, trade union as well as various regulations/etc   Retired by 2023- prior [employer] for [place]: workaholic    Social Determinants of Health   Financial Resource Strain: Low Risk  (05/12/2022)   Overall Financial Resource Strain (CARDIA)    Difficulty of Paying Living Expenses: Not very hard  Food Insecurity: No Food Insecurity (05/12/2022)   Hunger Vital Sign    Worried About Running Out of Food in the Last Year: Never true    [place] in the Last Year: Never true  Transportation Needs: No Transportation Needs (05/12/2022)   PRAPARE - Hydrologist (Medical): No    Lack of Transportation (Non-Medical): No  Physical Activity: Insufficiently Active (05/12/2022)   Exercise Vital Sign    Days of Exercise per Week: 1 day    Minutes of Exercise per Session: 10 min  Stress: No Stress Concern Present (05/12/2022)   [place]    Feeling of Stress : Not at all  Social Connections: Moderately Integrated (05/12/2022)   Social Connection and Isolation Panel [NHANES]    Frequency of Communication with Friends and Family: Once a week    Frequency of Social Gatherings with Friends and Family: Once a  week    Attends Religious Services: More than 4 times per year    Active Member of [employer] or Organizations: Yes    Attends Archivist Meetings: 1 to 4 times per year    Marital Status: Married     Family History: The patient's family history includes Arthritis in his mother; Cancer in his father; Diabetes in his maternal grandfather; Heart disease in an other family member. There is no history of Colon cancer, Colon polyps, Esophageal cancer, Stomach cancer, or Rectal cancer.  ROS:  Please see the history of present illness.     All other systems reviewed and are negative.  EKGs/Labs/Other Studies Reviewed:    The following studies were reviewed today:   EKG:   July 24, 2022: Normal sinus rhythm at 90.  Incomplete right bundle branch block.  No ST or T wave changes.  Recent Labs: 02/16/2022: ALT 30; BUN 14; Creatinine, Ser 0.91; Hemoglobin 14.3; Platelets 240.0; Potassium 4.3; Sodium 140  Recent Lipid Panel    Component Value Date/Time   CHOL 133 02/16/2022 0851   CHOL 129 04/18/2021 0930   TRIG 89.0 02/16/2022 0851   HDL 57.50 02/16/2022 0851   HDL 52 04/18/2021 0930   CHOLHDL 2 02/16/2022 0851   VLDL 17.8 02/16/2022 0851   LDLCALC 58 02/16/2022 0851   LDLCALC 58 04/18/2021 0930   LDLCALC 101 (H) 02/10/2020 1004     Risk Assessment/Calculations:           Physical Exam:      Physical Exam: Blood pressure 126/78, pulse 90, height 6' 0.5" (1.842 m), weight 194 lb 3.2 oz (88.1 kg), SpO2 97 %.       GEN:  Well nourished, well developed in no acute distress HEENT: Normal NECK: No JVD; No carotid bruits LYMPHATICS: No lymphadenopathy CARDIAC: RRR , no murmurs, rubs, gallops RESPIRATORY:  Clear to auscultation without rales, wheezing or rhonchi  ABDOMEN: Soft, non-tender, non-distended MUSCULOSKELETAL:  No edema; No deformity  SKIN: Warm and dry NEUROLOGIC:  Alert and oriented x 3     ASSESSMENT:    1. Other hyperlipidemia   2. Coronary  artery disease due to lipid rich plaque   3. Hyperlipidemia, unspecified hyperlipidemia type   4. Coronary artery calcification      PLAN:       CAD :  has mild CAD .  LDL looks good -last LDL is 54.  Continue current medications.  He will have his labs checked by Dr. Yong Channel next September.  As long as he keeps his LDL around 55 I think the risk of him developing further coronary artery disease is quite low.    2  Hyperlipidemia :  Continue current medications.    Medication Adjustments/Labs and Tests Ordered: Current medicines are reviewed at length with the patient today.  Concerns regarding medicines are outlined above.  Orders Placed This Encounter  Procedures   EKG 12-Lead   No orders of the defined types were placed in this encounter.    Patient Instructions  Medication Instructions:   Your physician recommends that you continue on your current medications as directed. Please refer to the Current Medication list given to you today.  *If you need a refill on your cardiac medications before your next appointment, please call your pharmacy*     Follow-Up: At [facility], you and your health needs are our priority.  As part of our continuing mission to provide you with exceptional heart care, we have created designated Provider Care Teams.  These Care Teams include your primary Cardiologist (physician) and Advanced Practice Providers (APPs -  Physician Assistants and Nurse Practitioners) who all work together to provide you with the care you need, when you need it.  We recommend signing up for the patient portal called "MyChart".  Sign up information is provided on this After Visit Summary.  MyChart is used to connect with patients for Virtual Visits (Telemedicine).  Patients are able to view lab/test results, encounter notes, upcoming appointments, etc.  Non-urgent messages can be  sent to your provider as well.   To learn more about what you can do with  MyChart, go to [email or web address].    Your next appointment:   1 year(s)  Provider:   Mertie Moores, MD         Signed, Mertie Moores, MD  07/24/2022 5:03 PM    [place]

## 2022-07-24 ENCOUNTER — Encounter: Payer: Self-pay | Admitting: Cardiovascular Disease

## 2022-07-24 ENCOUNTER — Ambulatory Visit: Payer: 59 | Attending: Cardiovascular Disease | Admitting: Cardiovascular Disease

## 2022-07-24 VITALS — BP 126/78 | HR 90 | Ht 72.5 in | Wt 194.2 lb

## 2022-07-24 DIAGNOSIS — I2583 Coronary atherosclerosis due to lipid rich plaque: Secondary | ICD-10-CM | POA: Diagnosis not present

## 2022-07-24 DIAGNOSIS — E785 Hyperlipidemia, unspecified: Secondary | ICD-10-CM

## 2022-07-24 DIAGNOSIS — E7849 Other hyperlipidemia: Secondary | ICD-10-CM

## 2022-07-24 DIAGNOSIS — I2584 Coronary atherosclerosis due to calcified coronary lesion: Secondary | ICD-10-CM

## 2022-07-24 DIAGNOSIS — I2510 Atherosclerotic heart disease of native coronary artery without angina pectoris: Secondary | ICD-10-CM

## 2022-07-24 NOTE — Patient Instructions (Signed)
Medication Instructions:  Your physician recommends that you continue on your current medications as directed. Please refer to the Current Medication list given to you today.  *If you need a refill on your cardiac medications before your next appointment, please call your pharmacy*  Follow-Up: At [facility], you and your health needs are our priority.  As part of our continuing mission to provide you with exceptional heart care, we have created designated Provider Care Teams.  These Care Teams include your primary Cardiologist (physician) and Advanced Practice Providers (APPs -  Physician Assistants and Nurse Practitioners) who all work together to provide you with the care you need, when you need it.  We recommend signing up for the patient portal called "MyChart".  Sign up information is provided on this After Visit Summary.  MyChart is used to connect with patients for Virtual Visits (Telemedicine).  Patients are able to view lab/test results, encounter notes, upcoming appointments, etc.  Non-urgent messages can be sent to your provider as well.   To learn more about what you can do with MyChart, go to [email or web address].    Your next appointment:   1 year(s)  Provider:   Philip Nahser, MD    

## 2022-08-28 IMAGING — CT CT RENAL STONE PROTOCOL
2 of 4 series · 16 of 46 positions shown, 18 images · non-contrast
Comparison: February 07, 2013

CLINICAL DATA: Flank pain.



[Series 2: axial st · axial · 0.83mm/px · z∈[+698,+1148]mm · 13 of 100 slices shown, 15 images]
[im 5/100  soft-tissue]
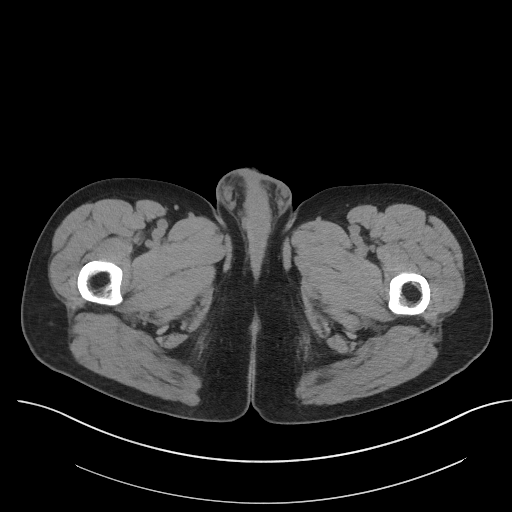
[im 5/100  bone]
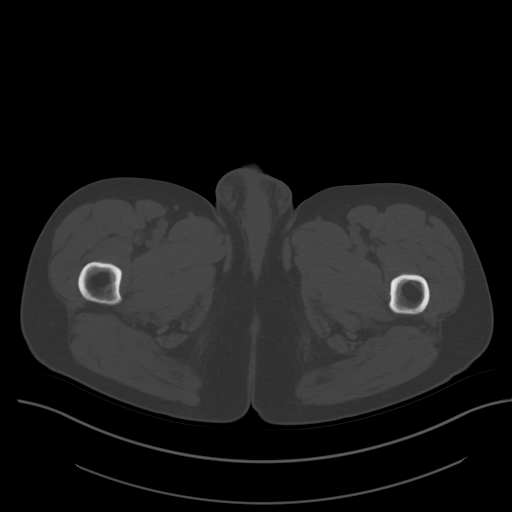
[im 13/100  soft-tissue]
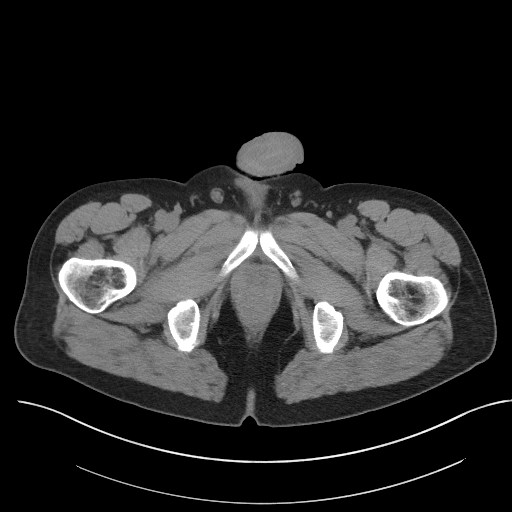
[im 21/100  soft-tissue]
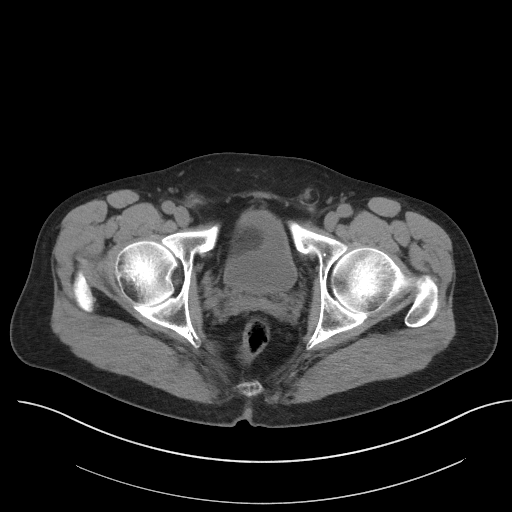
[im 29/100  soft-tissue]
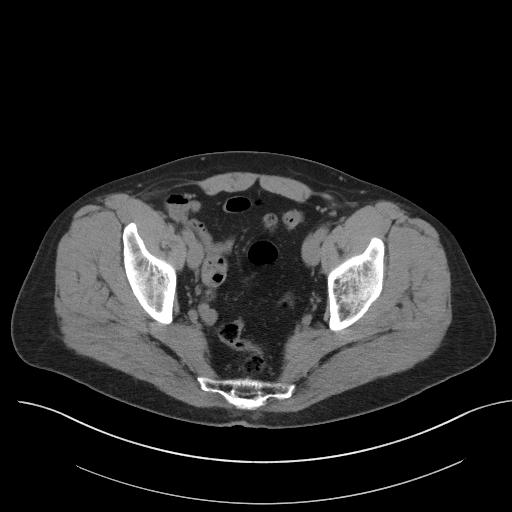
[im 34/100  soft-tissue]
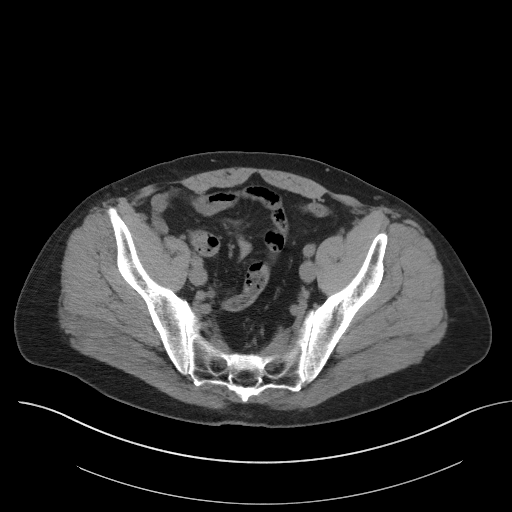
[im 42/100  soft-tissue]
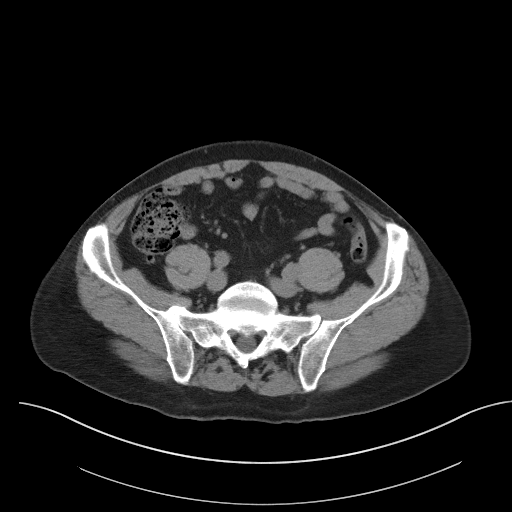
[im 50/100  soft-tissue]
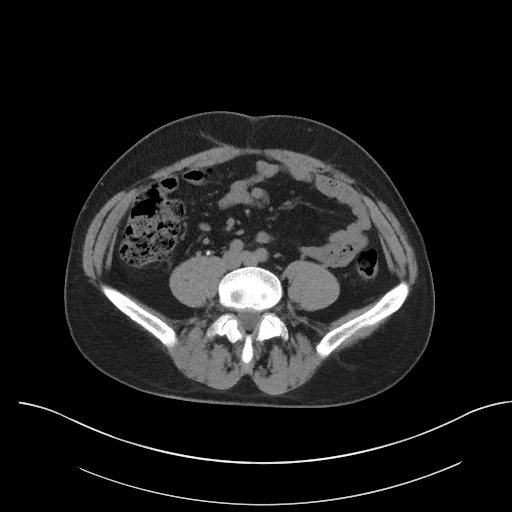
[im 58/100  soft-tissue]
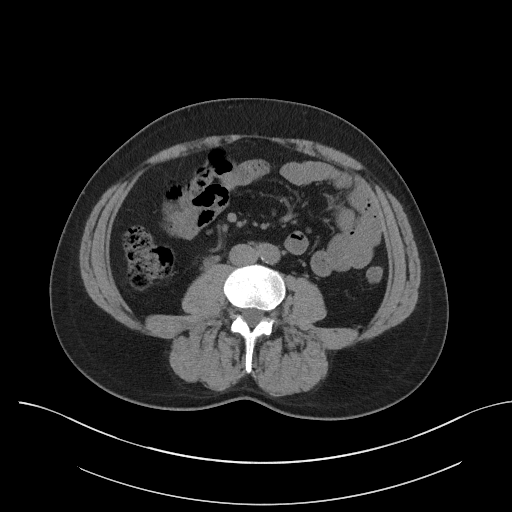
[im 67/100  soft-tissue]
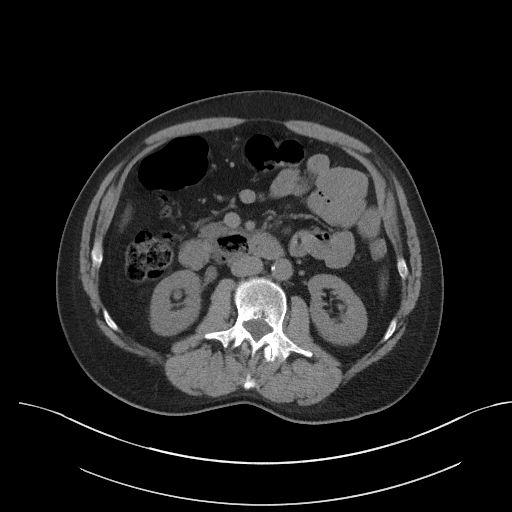
[im 67/100  bone]
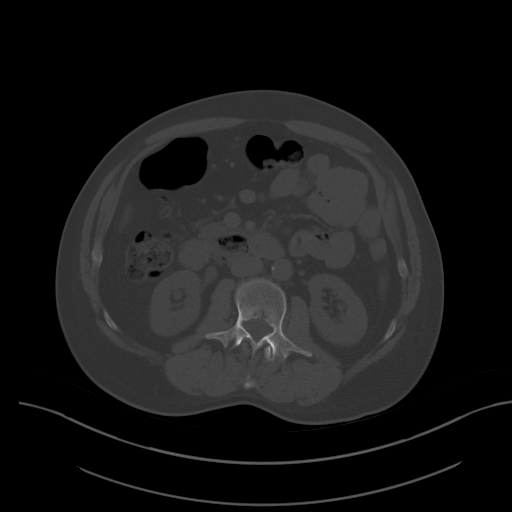
[im 71/100  soft-tissue]
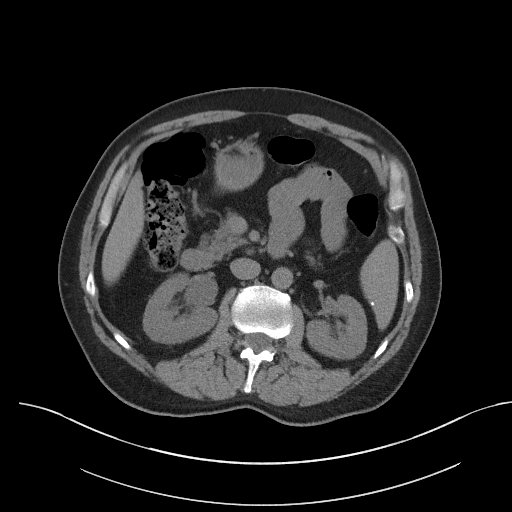
[im 79/100  soft-tissue]
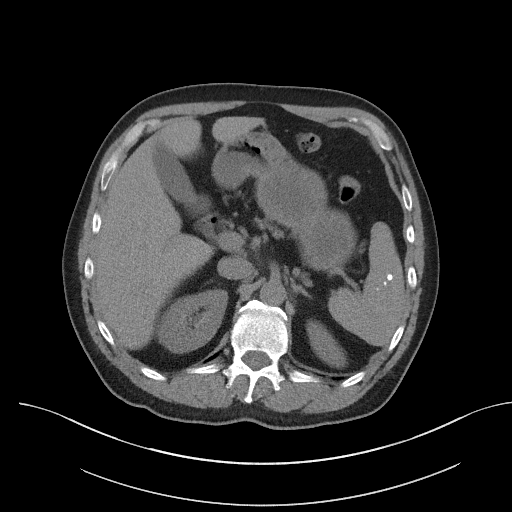
[im 87/100  soft-tissue]
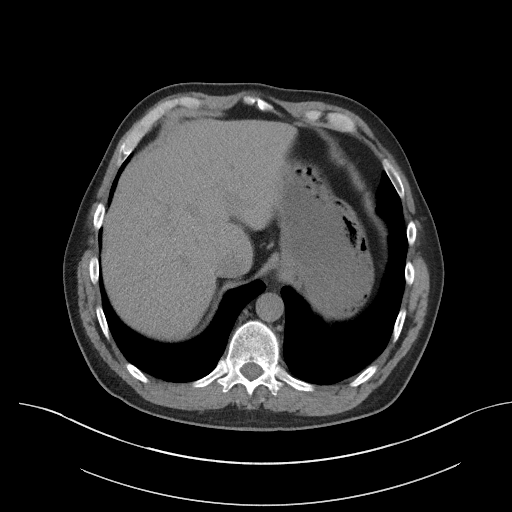
[im 95/100  soft-tissue]
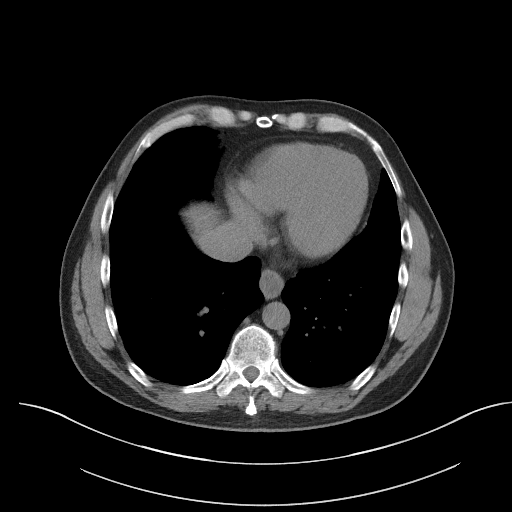

[Series 4: coronal st · coronal · 0.76mm/px · 3 of 100 slices shown]
[im 34/100  soft-tissue]
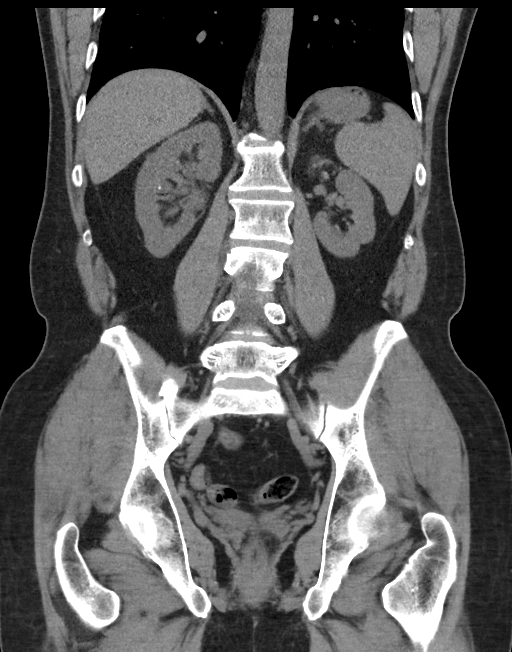
[im 45/100  soft-tissue]
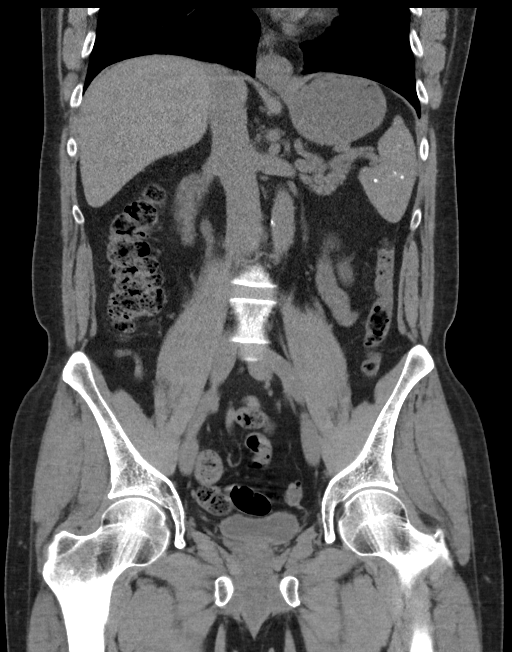
[im 56/100  soft-tissue]
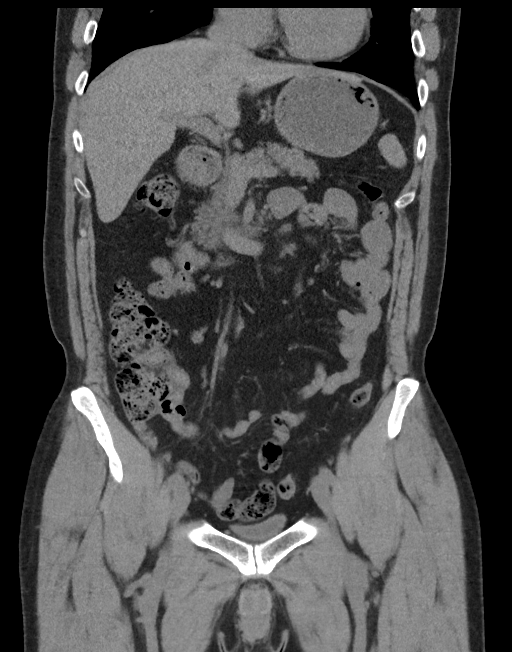

[16 of 46 positions shown; findings below may reference images not displayed]

FINDINGS: Lower chest: No acute abnormality.

Hepatobiliary: No focal liver abnormality is seen. No gallstones,
gallbladder wall thickening, or biliary dilatation.

Pancreas: Unremarkable. No pancreatic ductal dilatation or
surrounding inflammatory changes.

Spleen: Subcentimeter calcified granulomas are seen scattered
throughout the spleen.

Adrenals/Urinary Tract: Adrenal glands are unremarkable. Kidneys are
normal in size, without focal lesions. A 5 mm obstructing renal
calculus is seen within the mid right ureter with mild to moderate
severity right-sided hydronephrosis and hydroureter. 2 mm and 3 mm
nonobstructing renal calculi are seen within the right kidney.
Bladder is unremarkable.

Stomach/Bowel: Stomach is within normal limits. Appendix appears
normal. No evidence of bowel wall thickening, distention, or
inflammatory changes.

Vascular/Lymphatic: No significant vascular findings are present. No
enlarged abdominal or pelvic lymph nodes.

Reproductive: Prostate is unremarkable.

Other: No abdominal wall hernia or abnormality. No abdominopelvic
ascites.

Musculoskeletal: No acute or significant osseous findings.
IMPRESSION: 1. 5 mm obstructing renal calculus within the mid right ureter.
2. 2 mm and 3 mm nonobstructing right renal calculi.

## 2022-09-06 IMAGING — DX DG ABDOMEN 1V
2 series · 2 of 2 positions shown · non-contrast
Comparison: 09/13/2021 CT 09/11/2021, radiograph 02/13/2013,
09/12/2021, 09/13/2021

CLINICAL DATA: Pre lithotripsy

EXAM:
ABDOMEN - 1 VIEW

[abdomen kub (1 of 2)]
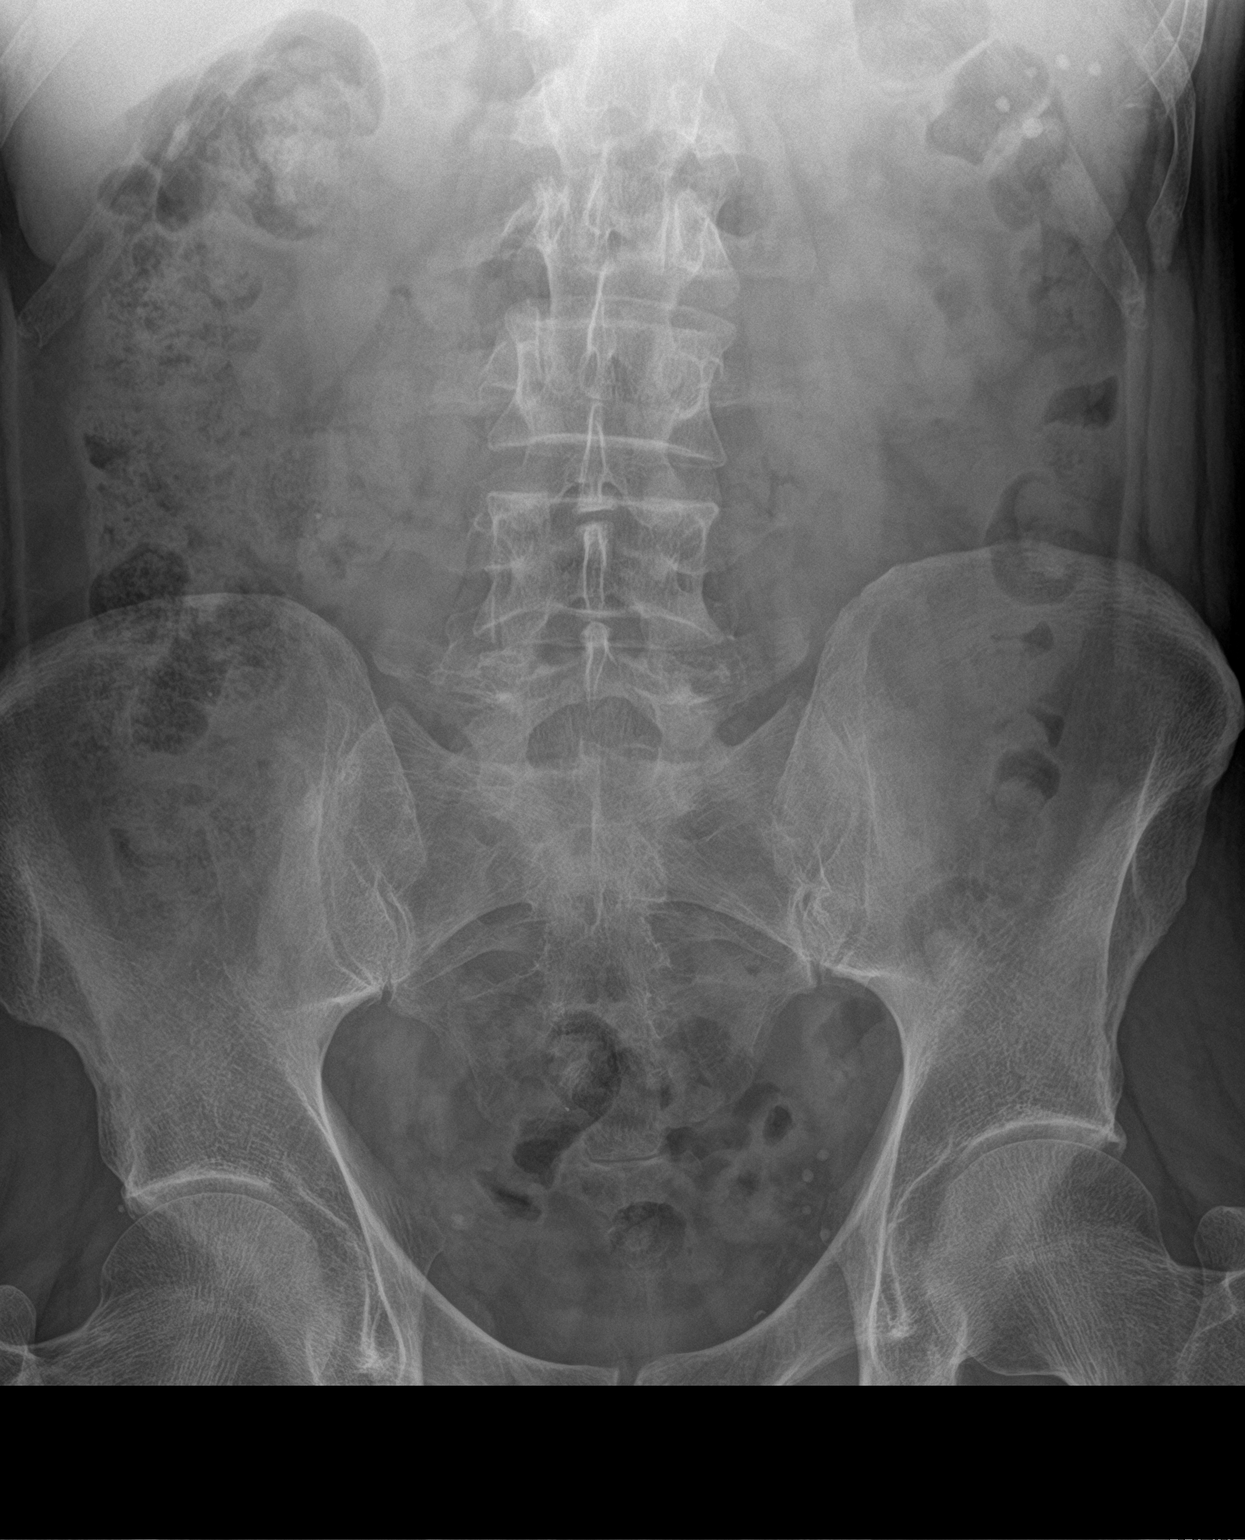

[abdomen kub (2 of 2)]
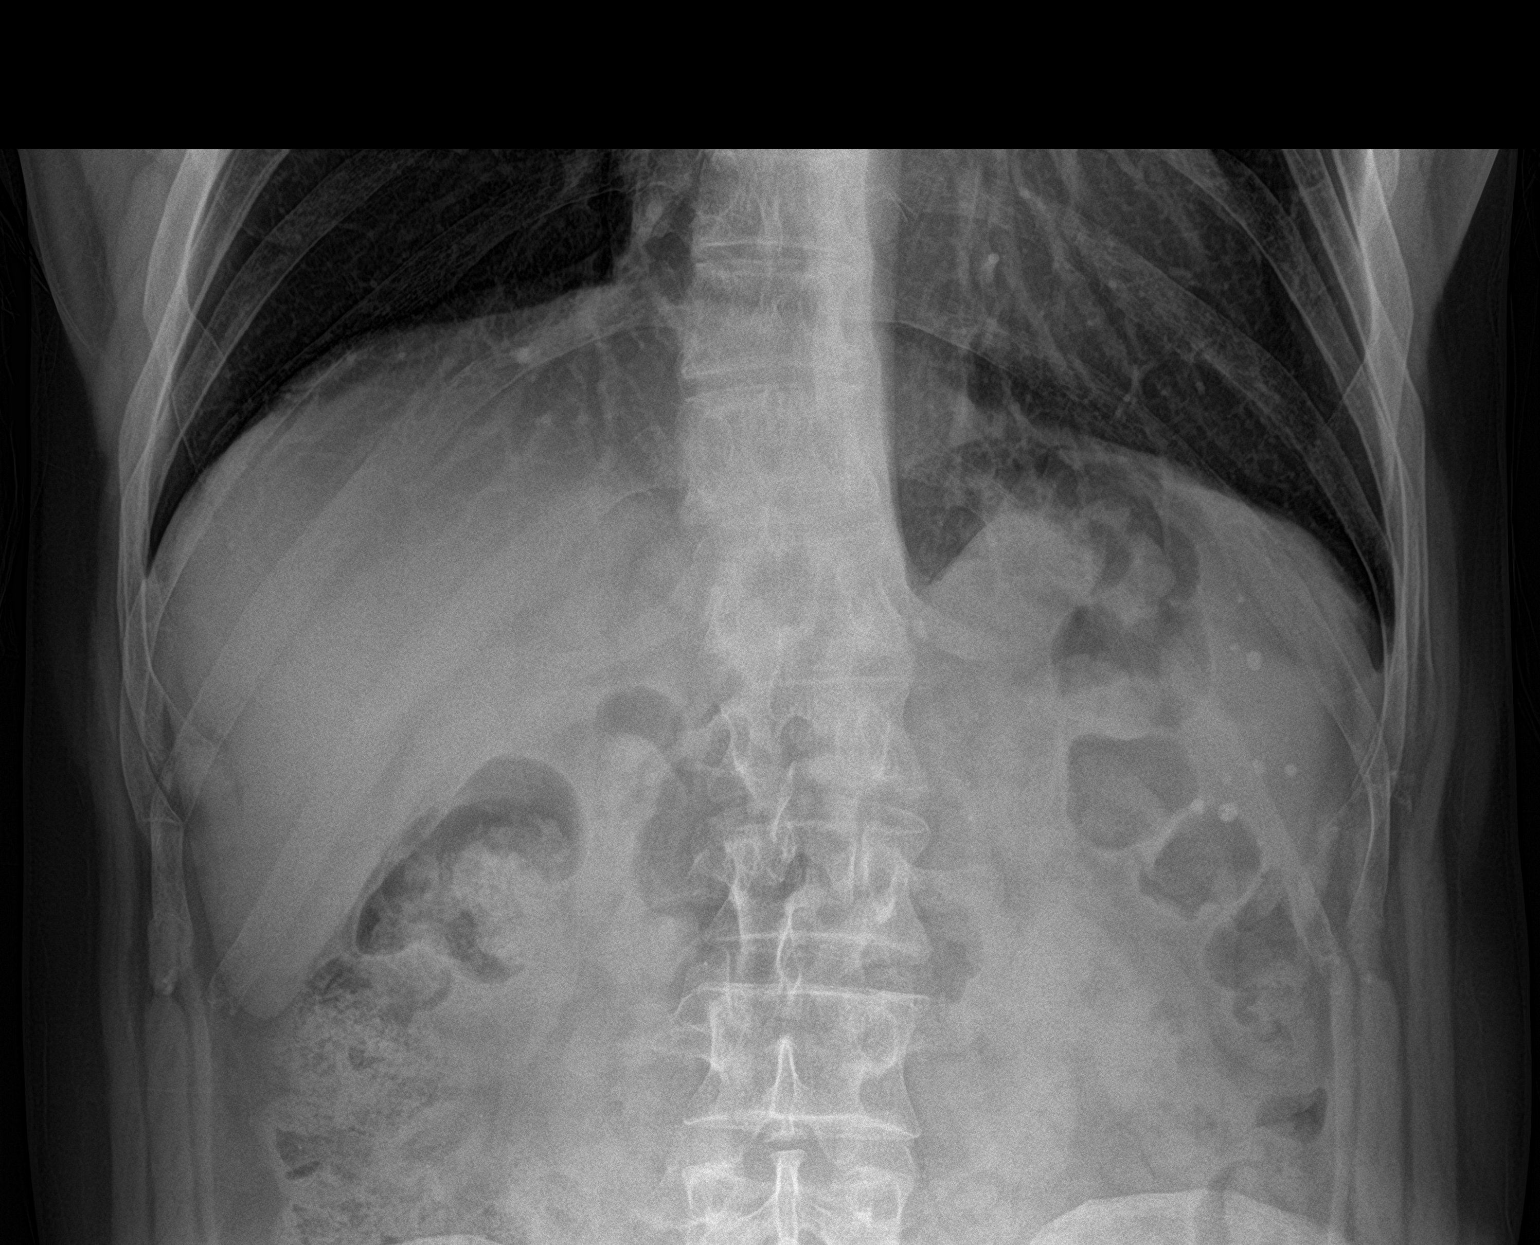

[2 of 2 positions shown; findings below may reference images not displayed]

FINDINGS: Nonobstructed bowel-gas pattern. Calcified splenic granuloma in the
left upper quadrant. Intrarenal stones on the right cannot be
visualized. 4 mm calcification in the right pelvis could reflect
distal migration of previously noted ureteral stone.
IMPRESSION: 1. 4 mm calcification in the right pelvis, possibly representing
distal migration of previously noted right ureteral calculus.

## 2022-11-28 ENCOUNTER — Other Ambulatory Visit: Payer: Self-pay | Admitting: Urology

## 2023-01-05 ENCOUNTER — Encounter (HOSPITAL_BASED_OUTPATIENT_CLINIC_OR_DEPARTMENT_OTHER): Payer: Self-pay | Admitting: Urology

## 2023-01-08 ENCOUNTER — Encounter (HOSPITAL_BASED_OUTPATIENT_CLINIC_OR_DEPARTMENT_OTHER): Payer: Self-pay | Admitting: Urology

## 2023-01-08 NOTE — Progress Notes (Signed)
Spoke w/ via phone for pre-op interview--- pt Lab needs dos---- [employer] results------ current EKG in epic/ chart COVID test -----patient states asymptomatic no test needed Arrive at ------- 0900 on 01-16-2023 NPO after MN NO Solid Food.  Clear liquids from MN until--- 0800 Med rec completed Medications to take morning of surgery ----- crestor, uroxatrol, prilosec Diabetic medication ----- n/a Patient instructed no nail polish to be worn day of surgery Patient instructed to bring photo id and insurance card day of surgery Patient aware to have Driver (ride ) / caregiver    for 24 hours after surgery --wife, Lucas Rocha Patient Special Instructions ----- n/a Pre-Op special Instructions ----- n/a Patient verbalized understanding of instructions that were given at this phone interview. Patient denies shortness of breath, chest pain, fever, cough at this phone interview.

## 2023-01-11 ENCOUNTER — Encounter (INDEPENDENT_AMBULATORY_CARE_PROVIDER_SITE_OTHER): Payer: Self-pay

## 2023-01-16 ENCOUNTER — Other Ambulatory Visit: Payer: Self-pay

## 2023-01-16 ENCOUNTER — Ambulatory Visit (HOSPITAL_BASED_OUTPATIENT_CLINIC_OR_DEPARTMENT_OTHER)
Admission: RE | Admit: 2023-01-16 | Discharge: 2023-01-16 | Disposition: A | Discharge status: Home / Self Care | Payer: 59 | Source: Home / Self Care | Attending: Urology | Admitting: Urology

## 2023-01-16 ENCOUNTER — Ambulatory Visit (HOSPITAL_BASED_OUTPATIENT_CLINIC_OR_DEPARTMENT_OTHER): Payer: 59 | Admitting: Anesthesiology

## 2023-01-16 ENCOUNTER — Encounter (HOSPITAL_BASED_OUTPATIENT_CLINIC_OR_DEPARTMENT_OTHER): Payer: Self-pay | Admitting: Urology

## 2023-01-16 ENCOUNTER — Encounter (HOSPITAL_BASED_OUTPATIENT_CLINIC_OR_DEPARTMENT_OTHER)
Admission: RE | Disposition: A | Discharge status: Home / Self Care | Payer: Self-pay | Source: Home / Self Care | Attending: Urology

## 2023-01-16 DIAGNOSIS — G4733 Obstructive sleep apnea (adult) (pediatric): Secondary | ICD-10-CM

## 2023-01-16 DIAGNOSIS — N200 Calculus of kidney: Secondary | ICD-10-CM | POA: Insufficient documentation

## 2023-01-16 DIAGNOSIS — N202 Calculus of kidney with calculus of ureter: Secondary | ICD-10-CM | POA: Diagnosis not present

## 2023-01-16 DIAGNOSIS — E785 Hyperlipidemia, unspecified: Secondary | ICD-10-CM

## 2023-01-16 DIAGNOSIS — I2510 Atherosclerotic heart disease of native coronary artery without angina pectoris: Secondary | ICD-10-CM | POA: Diagnosis not present

## 2023-01-16 DIAGNOSIS — Z01818 Encounter for other preprocedural examination: Secondary | ICD-10-CM

## 2023-01-16 HISTORY — PX: CYSTOSCOPY/URETEROSCOPY/HOLMIUM LASER/STENT PLACEMENT: SHX6546

## 2023-01-16 LAB — POCT I-STAT, CHEM 8
BUN: 11 mg/dL (ref 6–20)
Calcium, Ion: 1.14 mmol/L — ABNORMAL LOW (ref 1.15–1.40)
Chloride: 104 mmol/L (ref 98–111)
Creatinine, Ser: 0.9 mg/dL (ref 0.61–1.24)
Glucose, Bld: 111 mg/dL — ABNORMAL HIGH (ref 70–99)
HCT: 42 % (ref 39.0–52.0)
Hemoglobin: 14.3 g/dL (ref 13.0–17.0)
Potassium: 4 mmol/L (ref 3.5–5.1)
Sodium: 140 mmol/L (ref 135–145)
TCO2: 24 mmol/L (ref 22–32)

## 2023-01-16 SURGERY — CYSTOSCOPY/URETEROSCOPY/HOLMIUM LASER/STENT PLACEMENT
Anesthesia: General | Site: Renal | Laterality: Right

## 2023-01-16 MED ORDER — OXYCODONE HCL 5 MG/5ML PO SOLN: 5.0000 mg | Freq: Once | ORAL | Status: DC | PRN

## 2023-01-16 MED ORDER — DEXAMETHASONE SODIUM PHOSPHATE 10 MG/ML IJ SOLN
INTRAMUSCULAR | Status: DC | PRN
  Administered 2023-01-16: 5 mg via INTRAVENOUS

## 2023-01-16 MED ORDER — DEXAMETHASONE SODIUM PHOSPHATE 10 MG/ML IJ SOLN
INTRAMUSCULAR | Status: AC
  Filled 2023-01-16: qty 1

## 2023-01-16 MED ORDER — ONDANSETRON HCL 4 MG/2ML IJ SOLN
INTRAMUSCULAR | Status: AC
  Filled 2023-01-16: qty 2

## 2023-01-16 MED ORDER — SODIUM CHLORIDE 0.9 % IR SOLN
Status: DC | PRN
  Administered 2023-01-16: 3000 mL

## 2023-01-16 MED ORDER — FENTANYL CITRATE (PF) 100 MCG/2ML IJ SOLN: 25.0000 ug | INTRAMUSCULAR | Status: DC | PRN

## 2023-01-16 MED ORDER — PROPOFOL 10 MG/ML IV BOLUS
INTRAVENOUS | Status: DC | PRN
  Administered 2023-01-16: 30 mg via INTRAVENOUS
  Administered 2023-01-16: 170 mg via INTRAVENOUS
  Administered 2023-01-16: 50 mg via INTRAVENOUS

## 2023-01-16 MED ORDER — LIDOCAINE 2% (20 MG/ML) 5 ML SYRINGE
INTRAMUSCULAR | Status: DC | PRN
  Administered 2023-01-16: 100 mg via INTRAVENOUS

## 2023-01-16 MED ORDER — LIDOCAINE HCL (PF) 2 % IJ SOLN
INTRAMUSCULAR | Status: AC
  Filled 2023-01-16: qty 5

## 2023-01-16 MED ORDER — CEFAZOLIN SODIUM-DEXTROSE 2-4 GM/100ML-% IV SOLN
INTRAVENOUS | Status: AC
  Filled 2023-01-16: qty 100

## 2023-01-16 MED ORDER — ACETAMINOPHEN 500 MG PO TABS
1000.0000 mg | ORAL_TABLET | Freq: Once | ORAL | Status: AC
  Administered 2023-01-16: 1000 mg via ORAL

## 2023-01-16 MED ORDER — ONDANSETRON HCL 4 MG PO TABS: 4.0000 mg | ORAL_TABLET | Freq: Three times a day (TID) | ORAL | 1 refills | Status: DC | PRN

## 2023-01-16 MED ORDER — MIDAZOLAM HCL 2 MG/2ML IJ SOLN
INTRAMUSCULAR | Status: AC
  Filled 2023-01-16: qty 2

## 2023-01-16 MED ORDER — LACTATED RINGERS IV SOLN
INTRAVENOUS | Status: DC
  Administered 2023-01-16: 1000 mL via INTRAVENOUS

## 2023-01-16 MED ORDER — SODIUM CHLORIDE 0.9% FLUSH: 3.0000 mL | Freq: Two times a day (BID) | INTRAVENOUS | Status: DC

## 2023-01-16 MED ORDER — FENTANYL CITRATE (PF) 100 MCG/2ML IJ SOLN
INTRAMUSCULAR | Status: DC | PRN
  Administered 2023-01-16: 50 ug via INTRAVENOUS

## 2023-01-16 MED ORDER — ONDANSETRON HCL 4 MG/2ML IJ SOLN
INTRAMUSCULAR | Status: DC | PRN
Start: 2023-01-16 — End: 2023-01-16
  Administered 2023-01-16: 4 mg via INTRAVENOUS

## 2023-01-16 MED ORDER — PROPOFOL 10 MG/ML IV BOLUS
INTRAVENOUS | Status: AC
  Filled 2023-01-16: qty 20

## 2023-01-16 MED ORDER — ACETAMINOPHEN 500 MG PO TABS
ORAL_TABLET | ORAL | Status: AC
  Filled 2023-01-16: qty 2

## 2023-01-16 MED ORDER — CIPROFLOXACIN IN D5W 400 MG/200ML IV SOLN
INTRAVENOUS | Status: AC
  Filled 2023-01-16: qty 200

## 2023-01-16 MED ORDER — OXYCODONE-ACETAMINOPHEN 5-325 MG PO TABS: 1.0000 | ORAL_TABLET | ORAL | 0 refills | Status: DC | PRN

## 2023-01-16 MED ORDER — CIPROFLOXACIN IN D5W 400 MG/200ML IV SOLN
400.0000 mg | INTRAVENOUS | Status: AC
  Administered 2023-01-16: 400 mg via INTRAVENOUS

## 2023-01-16 MED ORDER — MIDAZOLAM HCL 5 MG/5ML IJ SOLN
INTRAMUSCULAR | Status: DC | PRN
  Administered 2023-01-16: 2 mg via INTRAVENOUS

## 2023-01-16 MED ORDER — KETOROLAC TROMETHAMINE 30 MG/ML IJ SOLN
INTRAMUSCULAR | Status: DC | PRN
  Administered 2023-01-16: 30 mg via INTRAVENOUS

## 2023-01-16 MED ORDER — IOHEXOL 300 MG/ML  SOLN
INTRAMUSCULAR | Status: DC | PRN
  Administered 2023-01-16: 9 mL

## 2023-01-16 MED ORDER — OXYCODONE HCL 5 MG PO TABS: 5.0000 mg | ORAL_TABLET | Freq: Once | ORAL | Status: DC | PRN

## 2023-01-16 MED ORDER — FENTANYL CITRATE (PF) 100 MCG/2ML IJ SOLN
INTRAMUSCULAR | Status: AC
  Filled 2023-01-16: qty 2

## 2023-01-16 MED ORDER — KETOROLAC TROMETHAMINE 30 MG/ML IJ SOLN
INTRAMUSCULAR | Status: AC
  Filled 2023-01-16: qty 1

## 2023-01-16 SURGICAL SUPPLY — 30 items
BAG DRAIN URO-CYSTO SKYTR STRL (DRAIN) ×1 IMPLANT
BAG DRN UROCATH (DRAIN) ×1
BASKET STONE 1.7 NGAGE (UROLOGICAL SUPPLIES) IMPLANT
BASKET ZERO TIP NITINOL 2.4FR (BASKET) IMPLANT
BSKT STON RTRVL ZERO TP 2.4FR (BASKET)
CATH URET 5FR 70CM CONE TIP (BALLOONS) IMPLANT
CATH URETL OPEN 5X70 (CATHETERS) IMPLANT
CLOTH BEACON ORANGE TIMEOUT ST (SAFETY) ×1 IMPLANT
DRSG TEGADERM 2-3/8X2-3/4 SM (GAUZE/BANDAGES/DRESSINGS) IMPLANT
ELECT REM PT RETURN 9FT ADLT (ELECTROSURGICAL)
ELECTRODE REM PT RTRN 9FT ADLT (ELECTROSURGICAL) IMPLANT
GLOVE SURG SS PI 8.0 STRL IVOR (GLOVE) ×1 IMPLANT
GOWN STRL REUS W/TWL XL LVL3 (GOWN DISPOSABLE) ×1 IMPLANT
GUIDEWIRE ANG ZIPWIRE 038X150 (WIRE) IMPLANT
GUIDEWIRE STR DUAL SENSOR (WIRE) ×1 IMPLANT
INFUSOR MANOMETER BAG 3000ML (MISCELLANEOUS) IMPLANT
IV NS IRRIG 3000ML ARTHROMATIC (IV SOLUTION) ×1 IMPLANT
KIT BALLN UROMAX 15FX4 (MISCELLANEOUS) IMPLANT
KIT TURNOVER CYSTO (KITS) ×1 IMPLANT
LASER FIB FLEXIVA PULSE ID 365 (Laser) IMPLANT
MANIFOLD NEPTUNE II (INSTRUMENTS) ×1 IMPLANT
NS IRRIG 500ML POUR BTL (IV SOLUTION) ×1 IMPLANT
PACK CYSTO (CUSTOM PROCEDURE TRAY) ×1 IMPLANT
SHEATH NAVIGATOR HD 11/13X36 (SHEATH) IMPLANT
SLEEVE SCD COMPRESS KNEE MED (STOCKING) ×1 IMPLANT
STENT URET 6FRX26 CONTOUR (STENTS) IMPLANT
TRACTIP FLEXIVA PULS ID 200XHI (Laser) IMPLANT
TRACTIP FLEXIVA PULSE ID 200 (Laser)
TUBE CONNECTING 12X1/4 (SUCTIONS) ×1 IMPLANT
TUBING UROLOGY SET (TUBING) IMPLANT

## 2023-01-16 NOTE — Transfer of Care (Signed)
Immediate Anesthesia Transfer of Care Note  Patient: Lucas Rocha  Procedure(s) Performed: CYSTOSCOPY RIGHT RETROGRADE PYELOGRAMURETEROSCOPY/HOLMIUM LASER/STENT PLACEMENT (Right: Renal)  Patient Location: PACU  Anesthesia Type:General  Level of Consciousness: drowsy  Airway & Oxygen Therapy: Patient Spontanous Breathing and Patient connected to nasal cannula oxygen  Post-op Assessment: Report given to RN  Post vital signs: Reviewed and stable  Last Vitals:  Vitals Value Taken Time  BP 122/89   Temp    Pulse 78 01/16/23 1223  Resp 12   SpO2 100 % 01/16/23 1223  Vitals shown include unfiled device data.  Last Pain:  Vitals:   01/16/23 0919  TempSrc: Oral  PainSc: 0-No pain      Patients Stated Pain Goal: 6 (01/16/23 0919)  Complications: No notable events documented.

## 2023-01-16 NOTE — H&P (Signed)
cc: Urolithiasis   HPI: 57 year old man who presents today for urolithiasis follow-up. He currently denies any flank pain or gross hematuria. He has not had any passage of stone material. He has an upcoming trip to [place] in October.     ALLERGIES: Penicillins Sulfa Drugs    MEDICATIONS: Alfuzosin Hcl Er  Omeprazole CPDR Oral  Rosuvastatin Calcium     GU PSH: Cysto Uretero Lithotripsy - 2014 Cystoscopy Insert Stent - 2014 ESWL - 2014 Repair Sperm Duct - 2014       PSH Notes: Cystoscopy With Ureteroscopy With Lithotripsy, Cystoscopy With Insertion Of Ureteral Stent Right, Lithotripsy, Arthrocentesis Aspiration Of Ganglion Cyst Of Toe, Surgery Vas Deferens Vasovasostomy Bilateral, cyst foot   NON-GU PSH: Drain/inject Joint/bursa - 2014     GU PMH: Ureteral calculus - 11/09/2021, - 09/27/2021, - 09/13/2021, He has a right distal stone with urgency. His pain is reasonably control. I have recommended that he give it a few more days to try to pass. I will set him up next week for URS but he has had a stent in the past and would like to avoid it again. I have reviewed the risks of ureteroscopy including bleeding, infection, ureteral injury, need for a stent or secondary procedures, thrombotic events and anesthetic complications. Pain med refilled. , - 09/12/2021, Calculus of ureter, - 2016 Ureteral obstruction secondary to calculous - 09/13/2021, - 09/12/2021 Acute kidney failure, repeat BMP today. - 09/12/2021 BPH w/LUTS, Benign prostatic hyperplasia with urinary obstruction - 2016 Encounter for Prostate Cancer screening, Prostate cancer screening - 2016 Renal colic, Renal colic - 2014    NON-GU PMH: Encounter for general adult medical examination without abnormal findings, Encounter for preventive health examination - 2016 Personal history of other specified conditions, History of heartburn - 2014 GERD Sleep Apnea    FAMILY HISTORY: 2 daughters - Other Death In The Family Father -  Other Family Health Status Number - Father Kidney Stones - Other nephrolithiasis - Father   SOCIAL HISTORY: Marital Status: Married Preferred Language: English; Ethnicity: Not Hispanic Or Latino; Race: White Current Smoking Status: Patient has never smoked.   Tobacco Use Assessment Completed: Used Tobacco in last 30 days? Does not use smokeless tobacco. Has never drank.  Does not use drugs. Drinks 2 caffeinated drinks per day.     Notes: Alcohol Use, Never A Smoker, Occupation:, Marital History - Currently Married, Caffeine Use   REVIEW OF SYSTEMS:    GU Review Male:   Patient denies frequent urination, hard to postpone urination, burning/ pain with urination, get up at night to urinate, leakage of urine, stream starts and stops, trouble starting your stream, have to strain to urinate , erection problems, and penile pain.  Gastrointestinal (Upper):   Patient denies nausea, vomiting, and indigestion/ heartburn.  Gastrointestinal (Lower):   Patient denies diarrhea and constipation.  Constitutional:   Patient denies fever, night sweats, weight loss, and fatigue.  Cardiovascular:   Patient denies leg swelling and chest pains.  Musculoskeletal:   Patient denies back pain and joint pain.  Neurological:   Patient denies headaches and dizziness.  Psychologic:   Patient denies depression and anxiety.   VITAL SIGNS:      11/09/2022 03:47 PM  BP 141/92 mmHg  Pulse 97 /min  Temperature 97.8 F / 36.5 C   MULTI-SYSTEM PHYSICAL EXAMINATION:    Constitutional: Well-nourished. No physical deformities. Normally developed. Good grooming.  Cardiovascular: Normal temperature, normal extremity pulses, no swelling, no varicosities.  Skin: No paleness, no  jaundice, no cyanosis. No lesion, no ulcer, no rash.  Neurologic / Psychiatric: Oriented to time, oriented to place, oriented to person. No depression, no anxiety, no agitation.  Gastrointestinal: No mass, no tenderness, no rigidity, non obese  abdomen.     Complexity of Data:  Source Of History:  Patient  Records Review:   Previous Doctor Records, Previous Patient Records  Urine Test Review:   Urinalysis  X-Ray Review: Renal Ultrasound: Reviewed Films. Reviewed Report. Discussed With Patient.     05/25/14 02/10/13  PSA  Total PSA 0.55  0.48     11/09/22 11/09/22  Urinalysis  Urine Appearance Clear  Clear   Urine Color Yellow  Yellow   Urine Glucose Neg  Neg mg/dL  Urine Bilirubin Neg  Neg mg/dL  Urine Ketones Neg  Neg mg/dL  Urine Specific Gravity 1.015  1.015   Urine Blood Neg  Neg ery/uL  Urine pH 6.0  6.0   Urine Protein Neg  Neg mg/dL  Urine Urobilinogen 0.2  0.2 mg/dL  Urine Nitrites Neg  Neg   Urine Leukocyte Esterase Neg  Neg leu/uL   PROCEDURES:         Renal Ultrasound - 29518  Right Kidney: Length: 10.6cm Depth: 5.7 cm Cortical Width: 1.6cm Width: 5.6 cm  Left Kidney: Length: 11.3 cm Depth: 5.2cm Cortical Width: 1.4cm Width: 5.6 cm  Left Kidney/Ureter:  appears within normal limits   Right Kidney/Ureter:  2 non-obstructing calcs in the mid pole   Bladder:  PVR= 49.35ml      . Patient confirmed No Neulasta OnPro Device.           Urinalysis - 81003 Dipstick Dipstick Cont'd  Color: Yellow Bilirubin: Neg  Appearance: Clear Ketones: Neg  Specific Gravity: 1.015 Blood: Neg  pH: 6.0 Protein: Neg  Glucose: Neg Urobilinogen: 0.2    Nitrites: Neg    Leukocyte Esterase: Neg    Notes:      ASSESSMENT:      ICD-10 Details  1 GU:   Renal calculus - N20.0 Right, Chronic, Stable   PLAN:           Orders         Schedule X-Rays: 4 Months - KUB  Return Visit/Planned Activity: 4 Months - Office Visit, Extender, KUB          Document Letter(s):  Created for Patient: Clinical Summary         Notes:   Renal ultrasound is stable today. He does have 2 calcifications in the right midpole. I will had a difficult time visualizing these last year on KUB. I offered a KUB today, since he was  concerned about passing stones while in [place], however he would like to wait until he returns from his trip. At that time I would advise KUB imaging to see if we can appreciate the stones and he would be a candidate for lithotripsy.   General stone prevention strategies were discussed in detail today including: Increased hydration 2-1/2-3 and half liters of water per day, limiting sodium intake to less than 3000 mg/day, limiting animal protein to less than 10 ounces per day, the addition of lemon to water, maintaining a normal calcium diet, and limiting oxalate rich foods.  I will follow-up with him to see if he would like to obtain a 24-hour urine in the near future.        Next Appointment:      Next Appointment: 03/12/2023 07:45 AM    Appointment Type:  KUB    Location: [facility] [phone]    Provider: KUB KUB    Reason for Visit: 4 mo KUB/ oV

## 2023-01-16 NOTE — Anesthesia Preprocedure Evaluation (Signed)
Anesthesia Evaluation  Patient identified by MRN, date of birth, ID band Patient awake    Reviewed: Allergy & Precautions, NPO status , Patient's Chart, lab work & pertinent test results  Airway Mallampati: II  TM Distance: >3 FB Neck ROM: Full    Dental no notable dental hx.    Pulmonary sleep apnea and Continuous Positive Airway Pressure Ventilation    Pulmonary exam normal        Cardiovascular + CAD  + dysrhythmias  Rhythm:Regular Rate:Normal     Neuro/Psych negative neurological ROS  negative psych ROS   GI/Hepatic Neg liver ROS, hiatal hernia,GERD  Medicated,,  Endo/Other  negative endocrine ROS    Renal/GU Renal disease  negative genitourinary   Musculoskeletal negative musculoskeletal ROS (+)    Abdominal Normal abdominal exam  (+)   Peds  Hematology  (+) Blood dyscrasia, anemia   Anesthesia Other Findings   Reproductive/Obstetrics                             Anesthesia Physical Anesthesia Plan  ASA: 3  Anesthesia Plan: General   Post-op Pain Management: Tylenol PO (pre-op)*   Induction: Intravenous  PONV Risk Score and Plan: 2 and Ondansetron, Dexamethasone, Midazolam and Treatment may vary due to age or medical condition  Airway Management Planned: Mask and LMA  Additional Equipment: None  Intra-op Plan:   Post-operative Plan: Extubation in OR  Informed Consent: I have reviewed the patients History and Physical, chart, labs and discussed the procedure including the risks, benefits and alternatives for the proposed anesthesia with the patient or authorized representative who has indicated his/her understanding and acceptance.     Dental advisory given  Plan Discussed with: CRNA  Anesthesia Plan Comments:        Anesthesia Quick Evaluation

## 2023-01-16 NOTE — Discharge Instructions (Addendum)
You may remove the stent by pulling the attached string on Friday morning and if you don't feel you can do that, please call the office to have it removed.  Please bring the stones to the office for analysis.    Post Anesthesia Home Care Instructions  Activity: Get plenty of rest for the remainder of the day. A responsible individual must stay with you for 24 hours following the procedure.  For the next 24 hours, DO NOT: -Drive a car -Advertising copywriter -Drink alcoholic beverages -Take any medication unless instructed by your physician -Make any legal decisions or sign important papers.  Meals: Start with liquid foods such as gelatin or soup. Progress to regular foods as tolerated. Avoid greasy, spicy, heavy foods. If nausea and/or vomiting occur, drink only clear liquids until the nausea and/or vomiting subsides. Call your physician if vomiting continues.  Special Instructions/Symptoms: Your throat may feel dry or sore from the anesthesia or the breathing tube placed in your throat during surgery. If this causes discomfort, gargle with warm salt water. The discomfort should disappear within 24 hours.  [facility] [phone] Post Ureteroscopy With or Without Stent Instructions  Definitions:  Ureter: The duct that transports urine from the kidney to the bladder. Stent:   A plastic hollow tube that is placed into the ureter, from the kidney to the bladder to prevent the ureter from swelling shut.  GENERAL INSTRUCTIONS:  Despite the fact that no skin incisions were used, the area around the ureter and bladder is raw and irritated. The stent is a foreign body which will further irritate the bladder wall. This irritation is manifested by increased frequency of urination, both day and night, and by an increase in the urge to urinate. In some, the urge to urinate is present almost always. Sometimes the urge is strong enough that you may not be able to stop yourself from  urinating. The only real cure is to remove the stent and then give time for the bladder wall to heal which can't be done until the danger of the ureter swelling shut has passed, which varies.  You may see some blood in your urine while the stent is in place and a few days afterwards. Do not be alarmed, even if the urine was clear for a while. Get off your feet and drink lots of fluids until clearing occurs. If you start to pass clots or don't improve, call us.  DIET: You may return to your normal diet immediately. Because of the raw surface of your bladder, alcohol, spicy foods, acid type foods and drinks with caffeine may cause irritation or frequency and should be used in moderation. To keep your urine flowing freely and to avoid constipation, drink plenty of fluids during the day ( 8-10 glasses ). Tip: Avoid cranberry juice because it is very acidic.  ACTIVITY: Your physical activity doesn't need to be restricted. However, if you are very active, you may see some blood in your urine. We suggest that you reduce your activity under these circumstances until the bleeding has stopped.  BOWELS: It is important to keep your bowels regular during the postoperative period. Straining with bowel movements can cause bleeding. A bowel movement every other day is reasonable. Use a mild laxative if needed, such as Milk of Magnesia 2-3 tablespoons, or 2 Dulcolax tablets. Call if you continue to have problems. If you have been taking narcotics for pain, before, during or after your surgery, you may be constipated. Take a laxative  if necessary.   MEDICATION: You should resume your pre-surgery medications unless told not to. In addition you will often be given an antibiotic to prevent infection. These should be taken as prescribed until the bottles are finished unless you are having an unusual reaction to one of the drugs.  PROBLEMS YOU SHOULD REPORT TO [place]: Fevers over 100.5 Fahrenheit. Heavy bleeding, or clots  ( See above notes about blood in urine ). Inability to urinate. Drug reactions ( hives, rash, nausea, vomiting, diarrhea ). Severe burning or pain with urination that is not improving.  FOLLOW-UP: You will need a follow-up appointment to monitor your progress. Call for this appointment at the number listed above. Usually the first appointment will be about three to fourteen days after your surgery.    MAY HAVE TYLENOL AFTER 3:30 PM IF NEEDED MAY HAVE IBUPROFEN, MOTRIN, ADVIL, ALEVE OR ANY NSAID AFTER 6:15 PM IF NEEDED

## 2023-01-16 NOTE — Interval H&P Note (Signed)
History and Physical Interval Note:  I reviewed the CT and discussed the pros and cons of proceeding for an enlarging but asymptomatic stone.  He is traveling in 2.5 weeks  to [place] and is concerned about having the [place] drop [place].   We will proceed with the ureteroscopy.   01/16/2023 10:58 AM  Lucas Rocha  has presented today for surgery, with the diagnosis of RIGHT RENAL CALCULI.  The various methods of treatment have been discussed with the patient and family. After consideration of risks, benefits and other options for treatment, the patient has consented to  Procedure(s) with comments: CYSTOSCOPY RIGHT RETROGRADE PYELOGRAMURETEROSCOPY/HOLMIUM LASER/STENT PLACEMENT (Right) - 1 HR FOR CASE as a surgical intervention.  The patient's history has been reviewed, patient examined, no change in status, stable for surgery.  I have reviewed the patient's chart and labs.  Questions were answered to the patient's satisfaction.     Bjorn Pippin

## 2023-01-16 NOTE — Anesthesia Procedure Notes (Signed)
Procedure Name: LMA Insertion Date/Time: 01/16/2023 11:24 AM  Performed by: Briant Sites, CRNAPre-anesthesia Checklist: Patient identified, Emergency Drugs available, Suction available and Patient being monitored Patient Re-evaluated:Patient Re-evaluated prior to induction Oxygen Delivery Method: Circle system utilized Preoxygenation: Pre-oxygenation with 100% oxygen Induction Type: IV induction Ventilation: Mask ventilation without difficulty LMA: LMA inserted LMA Size: 5.0 Number of attempts: 1 Airway Equipment and Method: Bite block Placement Confirmation: positive ETCO2 Tube secured with: Tape Dental Injury: Teeth and Oropharynx as per pre-operative assessment

## 2023-01-16 NOTE — Op Note (Signed)
Procedure: 1.  Cystoscopy with right retrograde pyelogram and interpretation. 2.  Right ureteroscopy with holmium laser application, stone extraction and insertion of double-J stent.  Preop diagnosis: Metabolically active right renal stones.  Postop diagnosis: Same.  Surgeon: Dr. Bjorn Pippin.  Anesthesia: General.  Specimen: Stone fragments.  Drains: 6 French by 26 cm right contour double-J stent with tether.  EBL: None.  Complications: None.  Indications: The patient is a 57 year old male who has a history of right renal stones with evidence of increase in size over the past year on recent CT.  He is planning to go to [place] for fishing trip and did not want to risk having the stones dropped during the trip and is elected to undergo treatment of the enlarging stones, the largest of which appears to be about 5 mm.  He was taken the operating room where general anesthetic was induced.  He was Cipro.  He was placed in lithotomy position and fitted with PAS hose.  His perineum and genitalia were prepped with Betadine solution he was draped in usual sterile fashion.  Cystoscopy was performed using a 21 [place] scope and 30 degree lens.  Examination really normal urethra.  The external sphincter was intact.  The prostatic urethra short without significant obstruction.  Examination of bladder revealed a smooth wall without tumors, stones or inflammation.  Ureteral orifices were unremarkable.  Right ureteral orifice was cannulated with a 5 [place] open-ended catheter and Omnipaque was instilled.  The right retrograde pyelogram demonstrated a delicate but otherwise normal ureter without filling defects.  No obvious filling defects were noted in the kidney which was nondilated and on the precontrast views I could not clearly see the stones.  A sensor wire was then advanced to the kidney and the inner core of an 11/13 [place] 36 cm digital access sheath was advanced over the wire under fluoroscopic  guidance.  It passed easily into the proximal ureter.  I then attempted to insert the assembled sheath but it would not pass the intramural ureter.  At this point I used the single-lumen digital flexible scope over the wire and advanced it to the kidney under fluoroscopic and visual guidance.  The wire was removed and intrarenal collecting system was inspected a couple of small loose stones were noted in an upper calyx and then there were a couple of adherent stones in the mid calyx consistent with the stone seen on CT scan the lower calyx was free of stones.  There were some Randall's plaques on several calyceal tips.  The 242 m tract tip laser fiber was then advanced through the scope with the laser set on 0.3 J and 53 Hz on the left pedal and 1 J and 20 Hz on the right pedal.  The left pedal was used to fragment the stones in the upper and mid pole calyces.  I felt like I would need to remove some of the fragments to ensure that he did not have issues following the procedure so I replaced the sensor wire and removed the ureteroscope.  I felt that the time of the ureter may have soft dilated the intramural ureter but I was still unable to pass the assembled access sheath.  At this point I used a 4 cm x 15 French high-pressure balloon which was placed over the wire across the intramural ureter and inflated to 20 atm with good dilation.  At this point I was able to easily pass the assembled 11/13 [place] sheath to the  proximal ureter.  The inner core and wire were removed and the ureteroscope was then passed.  The stones in the upper and mid calyces were then removed with an engage basket.  Once no residual significant stone fragments remained, I remove the ureteroscope and replaced the wire.  The access sheath was then removed.  The cystoscope was reinserted over the wire and a 6 [place] by 26 cm contour double-J stent with tether was advanced the kidney under fluoroscopic guidance.  The wire was removed,  leaving a good coil in the kidney and a good coil in the bladder.  The bladder was drained and the cystoscope was removed leaving the stent string exiting urethra.  The string was then secured to the patient's penis.  Final fluoroscopy demonstrated good position of the stent.  He was taken down from lithotomy position, his anesthetic was reversed and he was moved recovery in stable condition.  There were no complications.  The stone fragments were given to his wife to bring to the office for follow-up analysis.

## 2023-01-16 NOTE — Anesthesia Postprocedure Evaluation (Signed)
Anesthesia Post Note  Patient: Lucas Rocha  Procedure(s) Performed: CYSTOSCOPY RIGHT RETROGRADE PYELOGRAMURETEROSCOPY/HOLMIUM LASER/STENT PLACEMENT (Right: Renal)     Patient location during evaluation: PACU Anesthesia Type: General Level of consciousness: awake and alert Pain management: pain level controlled Vital Signs Assessment: post-procedure vital signs reviewed and stable Respiratory status: spontaneous breathing, nonlabored ventilation, respiratory function stable and patient connected to nasal cannula oxygen Cardiovascular status: blood pressure returned to baseline and stable Postop Assessment: no apparent nausea or vomiting Anesthetic complications: no   No notable events documented.  Last Vitals:  Vitals:   01/16/23 1230 01/16/23 1245  BP: 127/73 (!) 133/90  Pulse: 73 70  Resp: 11 13  Temp:  (!) 36.3 C  SpO2: 98% 99%    Last Pain:  Vitals:   01/16/23 1230  TempSrc:   PainSc: 0-No pain                 Earl Lites P Florita Nitsch

## 2023-01-17 ENCOUNTER — Encounter (HOSPITAL_BASED_OUTPATIENT_CLINIC_OR_DEPARTMENT_OTHER): Payer: Self-pay | Admitting: Urology

## 2023-02-20 ENCOUNTER — Ambulatory Visit (INDEPENDENT_AMBULATORY_CARE_PROVIDER_SITE_OTHER): Payer: 59 | Admitting: Family Medicine

## 2023-02-20 ENCOUNTER — Encounter: Payer: Self-pay | Admitting: Family Medicine

## 2023-02-20 VITALS — BP 112/60 | HR 86 | Temp 98.3°F | Resp 18 | Ht 72.0 in | Wt 190.2 lb

## 2023-02-20 DIAGNOSIS — I2510 Atherosclerotic heart disease of native coronary artery without angina pectoris: Secondary | ICD-10-CM | POA: Diagnosis not present

## 2023-02-20 DIAGNOSIS — Z131 Encounter for screening for diabetes mellitus: Secondary | ICD-10-CM

## 2023-02-20 DIAGNOSIS — I2583 Coronary atherosclerosis due to lipid rich plaque: Secondary | ICD-10-CM

## 2023-02-20 DIAGNOSIS — Z0000 Encounter for general adult medical examination without abnormal findings: Secondary | ICD-10-CM

## 2023-02-20 DIAGNOSIS — E785 Hyperlipidemia, unspecified: Secondary | ICD-10-CM | POA: Diagnosis not present

## 2023-02-20 DIAGNOSIS — I700 Atherosclerosis of aorta: Secondary | ICD-10-CM

## 2023-02-20 DIAGNOSIS — E663 Overweight: Secondary | ICD-10-CM

## 2023-02-20 DIAGNOSIS — Z125 Encounter for screening for malignant neoplasm of prostate: Secondary | ICD-10-CM | POA: Diagnosis not present

## 2023-02-20 LAB — CBC WITH DIFFERENTIAL/PLATELET
Basophils Absolute: 0 10*3/uL (ref 0.0–0.1)
Basophils Relative: 0.6 % (ref 0.0–3.0)
Eosinophils Absolute: 0.2 10*3/uL (ref 0.0–0.7)
Eosinophils Relative: 3.3 % (ref 0.0–5.0)
HCT: 43.7 % (ref 39.0–52.0)
Hemoglobin: 14.4 g/dL (ref 13.0–17.0)
Lymphocytes Relative: 36.4 % (ref 12.0–46.0)
Lymphs Abs: 1.8 10*3/uL (ref 0.7–4.0)
MCHC: 32.9 g/dL (ref 30.0–36.0)
MCV: 91.5 fl (ref 78.0–100.0)
Monocytes Absolute: 0.6 10*3/uL (ref 0.1–1.0)
Monocytes Relative: 12 % (ref 3.0–12.0)
Neutro Abs: 2.4 10*3/uL (ref 1.4–7.7)
Neutrophils Relative %: 47.7 % (ref 43.0–77.0)
Platelets: 238 10*3/uL (ref 150.0–400.0)
RBC: 4.78 Mil/uL (ref 4.22–5.81)
RDW: 13.5 % (ref 11.5–15.5)
WBC: 5 10*3/uL (ref 4.0–10.5)

## 2023-02-20 LAB — COMPREHENSIVE METABOLIC PANEL
ALT: 29 U/L (ref 0–53)
AST: 22 U/L (ref 0–37)
Albumin: 4.3 g/dL (ref 3.5–5.2)
Alkaline Phosphatase: 96 U/L (ref 39–117)
BUN: 14 mg/dL (ref 6–23)
CO2: 29 mEq/L (ref 19–32)
Calcium: 9.7 mg/dL (ref 8.4–10.5)
Chloride: 107 mEq/L (ref 96–112)
Creatinine, Ser: 0.92 mg/dL (ref 0.40–1.50)
GFR: 93.81 mL/min (ref >60.00)
Glucose, Bld: 102 mg/dL — ABNORMAL HIGH (ref 70–99)
Potassium: 4.4 mEq/L (ref 3.5–5.1)
Sodium: 143 mEq/L (ref 135–145)
Total Bilirubin: 0.7 mg/dL (ref 0.2–1.2)
Total Protein: 7 g/dL (ref 6.0–8.3)

## 2023-02-20 LAB — LIPID PANEL
Cholesterol: 134 mg/dL (ref 0–200)
HDL: 57.7 mg/dL (ref >39.00)
LDL Cholesterol: 63 mg/dL (ref 0–99)
NonHDL: 76.59
Total CHOL/HDL Ratio: 2
Triglycerides: 69 mg/dL (ref 0.0–149.0)
VLDL: 13.8 mg/dL (ref 0.0–40.0)

## 2023-02-20 LAB — HEMOGLOBIN A1C: Hgb A1c MFr Bld: 5.6 % (ref 4.6–6.5)

## 2023-02-20 LAB — PSA: PSA: 1.28 ng/mL (ref 0.10–4.00)

## 2023-02-20 NOTE — Progress Notes (Signed)
Phone: [phone]   Subjective:  Patient presents today for their annual physical. Chief complaint-noted.   See problem oriented charting- ROS- full  review of systems was completed and negative  except for: recent kidney stone removal- some discomfort with that and stent, occasional memory issues  The following were reviewed and entered/updated in epic: Past Medical History:  Diagnosis Date   Allergic rhinitis    Allergy Child   Penicillin sulfs drugs   BPH (benign prostatic hyperplasia)    Coronary artery calcification seen on CAT scan    05-03-2021  calcium score = 54.Lucas, all located LAD/ D1  (cardiologist-- dr Melburn Popper)   ED (erectile dysfunction)    GERD (gastroesophageal reflux disease)    Hiatal hernia    History of iron deficiency anemia    History of kidney stones    Hyperlipidemia    Lower urinary tract symptoms (LUTS)    OSA on CPAP    01-08-2023  per pt uses nightly   RBBB (right bundle branch block)    Renal calculi 08/2021   right side   Sleep apnea 2020   Patient Active Problem List   Diagnosis Date Noted   nonobstructive CAD (coronary artery disease) 06/01/2021    Priority: High   Aortic atherosclerosis (HCC) 06/01/2021    Priority: Medium    Hyperlipidemia, unspecified 06/01/2021    Priority: Medium    BPH (benign prostatic hyperplasia)     Priority: Medium    G E R D 12/28/2006    Priority: Medium    Erectile dysfunction 09/17/2017    Priority: Low   OSA (obstructive sleep apnea)     Priority: Low   Ureterolithiasis 02/07/2013    Priority: Low   Gastric polyp 12/12/2011    Priority: Low   Diverticulosis 12/07/2011    Priority: Low   Hx of adenomatous colonic polyps 12/07/2011    Priority: Low   Iron deficiency anemia 10/24/2011    Priority: Low   Allergic rhinitis 01/01/2007    Priority: Low   Past Surgical History:  Procedure Laterality Date   COLONOSCOPY WITH PROPOFOL  05/24/2018   by dr [name]   CYSTOSCOPY WITH RETROGRADE  PYELOGRAM, URETEROSCOPY AND STENT PLACEMENT Right 05/14/2013   Procedure: CYSTOSCOPY WITH RETROGRADE PYELOGRAM, URETEROSCOPY AND STENT PLACEMENT;  Surgeon: Sebastian Ache, MD;  Location: [facility];  Service: Urology;  Laterality: Right;   CYSTOSCOPY WITH RETROGRADE PYELOGRAM, URETEROSCOPY AND STENT PLACEMENT Right 09/20/2021   Procedure: CYSTOSCOPY WITH RIGHT RETROGRADE PYELOGRAM, URETEROSCOPY AND STENT PLACEMENT POSSIBLE HOLMIUM LASER LITHOTRIPSY;  Surgeon: Bjorn Pippin, MD;  Location: [facility];  Service: Urology;  Laterality: Right;   CYSTOSCOPY/URETEROSCOPY/HOLMIUM LASER/STENT PLACEMENT Right 01/16/2023   Procedure: CYSTOSCOPY RIGHT RETROGRADE PYELOGRAMURETEROSCOPY/HOLMIUM LASER/STENT PLACEMENT;  Surgeon: Bjorn Pippin, MD;  Location: [facility];  Service: Urology;  Laterality: Right;  1 HR FOR CASE   ESOPHAGOGASTRODUODENOSCOPY  11/28/2011   by dr pyrtle   EXTRACORPOREAL SHOCK WAVE LITHOTRIPSY Right 02/13/2013   @[facility]    FOOT GANGLION EXCISION Left 1999   HOLMIUM LASER APPLICATION Right 05/14/2013   Procedure: HOLMIUM LASER APPLICATION;  Surgeon: Sebastian Ache, MD;  Location: [facility];  Service: Urology;  Laterality: Right;    Family History  Problem Relation Age of Onset   Arthritis Mother    Cancer Father        unknown- not worked up 76   Diabetes Maternal Grandfather    Heart disease Other  Dad's side - grandfather, grandmother (big MI)   Diabetes Paternal Grandfather    Colon cancer Neg Hx    Colon polyps Neg Hx    Esophageal cancer Neg Hx    Stomach cancer Neg Hx    Rectal cancer Neg Hx     Medications- reviewed and updated Current Outpatient Medications  Medication Sig Dispense Refill   alfuzosin (UROXATRAL) 10 MG 24 hr tablet TAKE 1 TABLET BY MOUTH DAILY  WITH BREAKFAST (Patient taking differently: Take 10 mg by mouth daily.) 90 tablet 3   fexofenadine (ALLEGRA) 60 MG tablet Take 60 mg by mouth as  needed.      fluticasone (FLONASE) 50 MCG/ACT nasal spray Place 1 spray into both nostrils 2 (two) times daily.     Mometasone Furoate (ALLERGY NASAL SPRAY NA) Place into the nose daily as needed.     omeprazole (PRILOSEC) 20 MG capsule TAKE 1 CAPSULE BY MOUTH DAILY (Patient taking differently: Take 20 mg by mouth daily.) 90 capsule 3   rosuvastatin (CRESTOR) 5 MG tablet TAKE 1 TABLET BY MOUTH DAILY (Patient taking differently: Take 5 mg by mouth daily.) 90 tablet 3   sildenafil (VIAGRA) 100 MG tablet Take 1 tablet (100 mg total) by mouth daily as needed for erectile dysfunction. 10 tablet 11   aspirin EC 81 MG tablet Take 1 tablet (81 mg total) by mouth daily. Swallow whole. (Patient not taking: Reported on 01/08/2023) 90 tablet 3   No current facility-administered medications for this visit.    Allergies-reviewed and updated Allergies  Allergen Reactions   Penicillins Hives     AND HYPERACTIVITY   Sulfa Antibiotics     Unknown childhood reaction    Social History   Social History Narrative   Married. 2 daughters. 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 for [employer]      Hobbies: workaholic    Objective  Objective:  BP 112/60   Pulse 86   Temp 98.3 F (36.8 C) (Temporal)   Resp 18   Ht 6' (1.829 m)   Wt 190 lb 4 oz (86.3 kg)   SpO2 96%   BMI 25.80 kg/m  Gen: NAD, resting comfortably HEENT: Mucous membranes are moist. Oropharynx normal Neck: no thyromegaly CV: RRR no murmurs rubs or gallops Lungs: CTAB no crackles, wheeze, rhonchi Abdomen: soft/nontender/nondistended/normal bowel sounds. No rebound or guarding.  Ext: no edema Skin: warm, dry Neuro: grossly normal, moves all extremities, PERRLA    Assessment and Plan  57 y.o. Rocha presenting for annual  physical.  Health Maintenance counseling: 1. Anticipatory guidance: Patient counseled regarding regular dental exams -q6 months, eye exams -yearly,  avoiding smoking and second hand smoke , limiting alcohol to 2 beverages per day - 2 a year, no illicit drugs .   2. Risk factor reduction:  Advised patient of need for regular exercise and diet rich and fruits and vegetables to reduce risk of heart attack and stroke.  Exercise- last year walking dog regularly mile each day and encouraged trial 2- this year has been lower with work/travel but wants to improve on this.  Diet/weight management-weight down 6 lbs in last year- has tried to improve on diet some- less snacking.  Wt Readings from Last 3 Encounters:  02/20/23 190 lb 4 oz (  86.3 kg)  01/16/23 189 lb 1.6 oz (85.8 kg)  07/24/22 194 lb 3.2 oz (88.1 kg)  3. Immunizations/screenings/ancillary studies- flu shot later in season,  holding off on COVID Immunization History  Administered Date(s) Administered   Influenza Split 02/26/2013   Influenza,inj,Quad PF,6+ Mos 01/28/2019, 02/16/2022   Influenza-Unspecified 02/16/2016, 01/27/2018   Tdap 10/24/2011, 02/16/2022   Zoster Recombinant(Shingrix) 01/28/2019, 05/15/2019  4. Prostate cancer screening- low risk prior trend- update psa today   Lab Results  Component Value Date   PSA 0.28 02/16/2022   PSA 0.39 02/10/2021   PSA 0.34 02/10/2020   5. Colon cancer screening -  no polyps 05/24/2018 with 10-year follow-up planned  6. Skin cancer screening- sees dermatology. advised regular sunscreen use. Denies worrisome, changing, or new skin lesions.  7. Smoking associated screening (lung cancer screening, AAA screen 65-75, UA)- never smoker- urinalysis with urology 8. STD screening - opts out as monogamous  Status of chronic or acute concerns   #social update- some travel working with [employer] about 50 hours a week  #Nonobstructive CAD-has seen Dr. Elease Hashimoto this year #hyperlipidemia #Aortic  atherosclerosis S: Medication:Rosuvastatin 5 mg daily, aspirin 81 mg, has nitroglycerin available if needed -In 2022-coronary artery calcium score was 187 which was 90th percentile -no chest pain or shortness of breath  Lab Results  Component Value Date   CHOL 133 02/16/2022   HDL 57.50 02/16/2022   LDLCALC 58 02/16/2022   TRIG 89.0 02/16/2022   CHOLHDL 2 02/16/2022   A/P: Nonobstructive CAD asymptomatic continue current medications  Hyperlipidemia-at goal last year- update today  Aortic atherosclerosis (presumed stable)- LDL goal ideally <70 - continue current medications   #BPH S: Medication: Alfuzosin 10 mg extended release  A/P: stable- continue current medicines     # GERD S:Medication: Omeprazole 20 mg A/P: stable- continue current medicines    #Erectile dysfunction-sildenafil available if needed   Nephrolithiasis-managed by Dr. Annabell Howells with [facility] -3 removals in recent weeks for stones -sees urology soon again for recheck  #memory loss- about 3 years of issues. Forgets small details. Minicog normal 2022 and 2023. B12 has been high, TSH normal. Declined rpr/HIV. Has declined MRI brain. Possibly stress related . This year reports goes and comes- not worsening overall -he wonders if inattention related  Recommended follow up: Return in about 1 year (around Lucas/24/2025) for physical or sooner if needed.Schedule b4 you leave.  Lab/Order associations: fasting   ICD-10-CM   1. Preventative health care  Z00.00     2. Hyperlipidemia, unspecified hyperlipidemia type  E78.5     3. Screening for prostate cancer  Z12.5     4. Coronary artery disease due to lipid rich plaque  I25.10    I25.83     5. Aortic atherosclerosis (HCC)  I70.0     6. Screening for diabetes mellitus  Z13.1     7. Overweight  E66.3       No orders of the defined types were placed in this encounter.   Return precautions advised.  Tana Conch, MD

## 2023-02-20 NOTE — Patient Instructions (Addendum)
Please stop by lab before you go If you have mychart- we will send your results within 3 business days of [place] receiving them.  If you do not have mychart- we will call you about results within 5 business days of [place] receiving them.  *please also note that you will see labs on mychart as soon as they post. I will later go in and write notes on them- will say "notes from Dr. Durene Cal"   Recommended follow up: Return in about 1 year (around 02/20/2024) for physical or sooner if needed.Schedule b4 you leave.

## 2023-02-28 ENCOUNTER — Other Ambulatory Visit: Payer: Self-pay

## 2023-02-28 DIAGNOSIS — R972 Elevated prostate specific antigen [PSA]: Secondary | ICD-10-CM

## 2023-03-16 ENCOUNTER — Other Ambulatory Visit: Payer: Self-pay | Admitting: Family Medicine

## 2023-03-21 ENCOUNTER — Other Ambulatory Visit: Payer: Self-pay | Admitting: Family Medicine

## 2023-04-11 ENCOUNTER — Other Ambulatory Visit (INDEPENDENT_AMBULATORY_CARE_PROVIDER_SITE_OTHER): Payer: 59

## 2023-04-11 DIAGNOSIS — R972 Elevated prostate specific antigen [PSA]: Secondary | ICD-10-CM

## 2023-04-11 LAB — PSA: PSA: 0.53 ng/mL (ref 0.10–4.00)

## 2023-05-29 ENCOUNTER — Encounter: Payer: Self-pay | Admitting: Family Medicine

## 2023-05-29 ENCOUNTER — Ambulatory Visit: Payer: 59 | Admitting: Family Medicine

## 2023-05-29 VITALS — BP 122/75 | HR 92 | Temp 98.8°F | Wt 193.8 lb

## 2023-05-29 DIAGNOSIS — J209 Acute bronchitis, unspecified: Secondary | ICD-10-CM

## 2023-05-29 DIAGNOSIS — J0100 Acute maxillary sinusitis, unspecified: Secondary | ICD-10-CM | POA: Diagnosis not present

## 2023-05-29 DIAGNOSIS — J069 Acute upper respiratory infection, unspecified: Secondary | ICD-10-CM

## 2023-05-29 MED ORDER — DOXYCYCLINE HYCLATE 100 MG PO CAPS: 100.0000 mg | ORAL_CAPSULE | Freq: Two times a day (BID) | ORAL | 0 refills | Status: AC

## 2023-05-29 MED ORDER — PREDNISONE 20 MG PO TABS: ORAL_TABLET | ORAL | 0 refills | Status: DC

## 2023-05-29 NOTE — Progress Notes (Signed)
 OFFICE VISIT  05/29/2023  CC:  Chief Complaint  Patient presents with   Cough    2 weeks; sinus/head congestion, fatigue, HA. Denies fever. Nyquil did not help.     Patient is a 57 y.o. male who presents for nasal congestion.  HPI: Onset 2 weeks ago, nasal congestion with postnasal drip, fatigue, some headache.  Some cough.  Recently some wheezing.  Denies chest tightness, chest pain, or shortness of breath.  No fever. Symptoms not improving much with over-the-counter treatments. No nausea, vomiting, diarrhea, or rash.  Past Medical History:  Diagnosis Date   Allergic rhinitis    Allergy Child   Penicillin sulfs drugs   BPH (benign prostatic hyperplasia)    Coronary artery calcification seen on CAT scan    05-03-2021  calcium  score = 54.9, all located LAD/ D1  (cardiologist-- dr calhoun)   ED (erectile dysfunction)    GERD (gastroesophageal reflux disease)    Hiatal hernia    History of iron deficiency anemia    History of kidney stones    Hyperlipidemia    Lower urinary tract symptoms (LUTS)    OSA on CPAP    01-08-2023  per pt uses nightly   RBBB (right bundle branch block)    Renal calculi 08/2021   right side   Sleep apnea 2020    Past Surgical History:  Procedure Laterality Date   COLONOSCOPY WITH PROPOFOL   05/24/2018   by dr albertus   CYSTOSCOPY WITH RETROGRADE PYELOGRAM, URETEROSCOPY AND STENT PLACEMENT Right 05/14/2013   Procedure: CYSTOSCOPY WITH RETROGRADE PYELOGRAM, URETEROSCOPY AND STENT PLACEMENT;  Surgeon: Ricardo Likens, MD;  Location: [facility];  Service: Urology;  Laterality: Right;   CYSTOSCOPY WITH RETROGRADE PYELOGRAM, URETEROSCOPY AND STENT PLACEMENT Right 09/20/2021   Procedure: CYSTOSCOPY WITH RIGHT RETROGRADE PYELOGRAM, URETEROSCOPY AND STENT PLACEMENT POSSIBLE HOLMIUM LASER LITHOTRIPSY;  Surgeon: Watt Rush, MD;  Location: [facility];  Service: Urology;  Laterality: Right;   CYSTOSCOPY/URETEROSCOPY/HOLMIUM  LASER/STENT PLACEMENT Right 01/16/2023   Procedure: CYSTOSCOPY RIGHT RETROGRADE PYELOGRAMURETEROSCOPY/HOLMIUM LASER/STENT PLACEMENT;  Surgeon: Watt Rush, MD;  Location: [facility];  Service: Urology;  Laterality: Right;  1 HR FOR CASE   ESOPHAGOGASTRODUODENOSCOPY  11/28/2011   by dr pyrtle   EXTRACORPOREAL SHOCK WAVE LITHOTRIPSY Right 02/13/2013   @[facility]    FOOT GANGLION EXCISION Left 1999   HOLMIUM LASER APPLICATION Right 05/14/2013   Procedure: HOLMIUM LASER APPLICATION;  Surgeon: Ricardo Likens, MD;  Location: [facility];  Service: Urology;  Laterality: Right;    Outpatient Medications Prior to Visit  Medication Sig Dispense Refill   alfuzosin  (UROXATRAL ) 10 MG 24 hr tablet TAKE 1 TABLET BY MOUTH DAILY  WITH BREAKFAST (Patient taking differently: Take 10 mg by mouth daily.) 90 tablet 3   aspirin  EC 81 MG tablet Take 1 tablet (81 mg total) by mouth daily. Swallow whole. 90 tablet 3   fexofenadine (ALLEGRA) 60 MG tablet Take 60 mg by mouth as needed.      fluticasone  (FLONASE ) 50 MCG/ACT nasal spray Place 1 spray into both nostrils 2 (two) times daily.     Mometasone Furoate (ALLERGY NASAL SPRAY NA) Place into the nose daily as needed.     omeprazole  (PRILOSEC) 20 MG capsule TAKE 1 CAPSULE BY MOUTH DAILY 90 capsule 3   rosuvastatin  (CRESTOR ) 5 MG tablet TAKE 1 TABLET BY MOUTH DAILY 90 tablet 3   sildenafil  (VIAGRA ) 100 MG tablet Take 1 tablet (100 mg total) by mouth daily as needed  for erectile dysfunction. 10 tablet 11   No facility-administered medications prior to visit.    Allergies  Allergen Reactions   Penicillins Hives     AND HYPERACTIVITY   Sulfa Antibiotics     Unknown childhood reaction    Review of Systems  As per HPI  PE:    05/29/2023   11:11 AM 02/20/2023    7:52 AM 01/16/2023    1:14 PM  Vitals with BMI  Height  6' 0   Weight 193 lbs 13 oz 190 lbs 4 oz   BMI  25.8   Systolic 122 112 860  Diastolic 75 60 97  Pulse 92 86 69      Physical Exam  VS: noted--normal. Gen: alert, NAD, NONTOXIC APPEARING. HEENT: eyes without injection, drainage, or swelling.  Ears: EACs clear, TMs with normal light reflex and landmarks.  Nose: Clear rhinorrhea, with some dried, crusty exudate adherent to mildly injected mucosa.  No purulent d/c.  No paranasal sinus TTP.  No facial swelling.  Throat and mouth without focal lesion.  No pharyngial swelling, erythema, or exudate.   Neck: supple, no LAD.   LUNGS: CTA bilat, nonlabored resps.  He did cough easily when taking deep breaths CV: RRR, no m/r/g. EXT: no c/c/e SKIN: no rash   LABS:  Last metabolic panel Lab Results  Component Value Date   GLUCOSE 102 (H) 02/20/2023   NA 143 02/20/2023   K 4.4 02/20/2023   CL 107 02/20/2023   CO2 29 02/20/2023   BUN 14 02/20/2023   CREATININE 0.92 02/20/2023   GFR 93.81 02/20/2023   CALCIUM  9.7 02/20/2023   PROT 7.0 02/20/2023   ALBUMIN 4.3 02/20/2023   BILITOT 0.7 02/20/2023   ALKPHOS 96 02/20/2023   AST 22 02/20/2023   ALT 29 02/20/2023   ANIONGAP 6 09/11/2021   IMPRESSION AND PLAN:  URI with cough and congestion--->Acute sinusitis plus bronchitis, prolonged symptoms. Prednisone  40 mg a day x 5 days. Doxycycline  100 mg twice daily x 10 days. Saline nasal spray encouraged.  An After Visit Summary was printed and given to the patient.  FOLLOW UP: Return if symptoms worsen or fail to improve.  Signed:  Gerlene Hockey, MD           05/29/2023

## 2023-07-24 ENCOUNTER — Ambulatory Visit: Payer: 59 | Admitting: Cardiovascular Disease

## 2023-08-27 ENCOUNTER — Encounter: Payer: Self-pay | Admitting: Cardiovascular Disease

## 2023-08-27 NOTE — Progress Notes (Unsigned)
 Cardiology Office Note:    Date:  08/28/2023   ID:  ISHAAQ PENNA, DOB [birth year], MRN [id number]  PCP:  Shelva Majestic, MD   [facility] Providers Cardiologist:  Asra Gambrel    Referring MD: Shelva Majestic, MD   Chief Complaint  Patient presents with   Hyperlipidemia     Nov. 21, 2022     Lucas Rocha is a 58 y.o. male with a hx of  chest pain . We were asked by Dr. Durene Cal to see him for further evaluation of his chest pain .  ( Husband of Lucas Rocha )   Lucas Rocha is seen for CP . Pinpoint like CP  Occurs now and then , off and on through the day  Might last an hour or so  Not related to eating or drinkig, twisting his torso  No exertional , Not much exercise recently  Eats an unrestricted diet - Is on omeprazole .   Coronary calcium score of 187 Agatston units.  ( All located in the LAD ) This was 90th percentile for age-, race-, and sex-matched controls LDL is 100 - was just started on rosuvastatin   Feb. 21, 2023 Lucas Rocha is seen for follow up of his CP  Coronary CT angio showed a CAC of 54.9 He had minimal plaque in his LAD . Lipids from Nov. 2022 look good Not as much exercise  Walks the dog on occasion   Feb. 26, 2024; Lucas Rocha is seen for follow up of his CP  Coronary CT angio showed a CAC of 54.9 (90th percentile for age/ sex matched controls  He had minimal plaque in his LAD . Lipids from Nov. 2022 look good  Is retired from Engineer, manufacturing  Now working with [employer]   August 28, 2023 Lucas Rocha is seen for follow up of his CP, CAC  No CP , no dyspnea   Still eating a fair amount of carbohydrates.  His fasting glucose levels are mildly elevated on occasion     Past Medical History:  Diagnosis Date   Allergic rhinitis    Allergy Child   Penicillin sulfs drugs   BPH (benign prostatic hyperplasia)    Coronary artery calcification seen on CAT scan    05-03-2021  calcium score = 54.9, all located LAD/ D1  (cardiologist-- dr Melburn Popper)   ED  (erectile dysfunction)    GERD (gastroesophageal reflux disease)    Hiatal hernia    History of iron deficiency anemia    History of kidney stones    Hyperlipidemia    Lower urinary tract symptoms (LUTS)    OSA on CPAP    01-08-2023  per pt uses nightly   RBBB (right bundle branch block)    Renal calculi 08/2021   right side   Sleep apnea 2020    Past Surgical History:  Procedure Laterality Date   COLONOSCOPY WITH PROPOFOL  05/24/2018   by dr [name]   CYSTOSCOPY WITH RETROGRADE PYELOGRAM, URETEROSCOPY AND STENT PLACEMENT Right 05/14/2013   Procedure: CYSTOSCOPY WITH RETROGRADE PYELOGRAM, URETEROSCOPY AND STENT PLACEMENT;  Surgeon: Sebastian Ache, MD;  Location: [facility];  Service: Urology;  Laterality: Right;   CYSTOSCOPY WITH RETROGRADE PYELOGRAM, URETEROSCOPY AND STENT PLACEMENT Right 09/20/2021   Procedure: CYSTOSCOPY WITH RIGHT RETROGRADE PYELOGRAM, URETEROSCOPY AND STENT PLACEMENT POSSIBLE HOLMIUM LASER LITHOTRIPSY;  Surgeon: Bjorn Pippin, MD;  Location: [facility];  Service: Urology;  Laterality: Right;   CYSTOSCOPY/URETEROSCOPY/HOLMIUM LASER/STENT PLACEMENT Right 01/16/2023   Procedure:  CYSTOSCOPY RIGHT RETROGRADE PYELOGRAMURETEROSCOPY/HOLMIUM LASER/STENT PLACEMENT;  Surgeon: Bjorn Pippin, MD;  Location: [facility];  Service: Urology;  Laterality: Right;  1 HR FOR CASE   ESOPHAGOGASTRODUODENOSCOPY  11/28/2011   by dr pyrtle   EXTRACORPOREAL SHOCK WAVE LITHOTRIPSY Right 02/13/2013   @[facility]    FOOT GANGLION EXCISION Left 1999   HOLMIUM LASER APPLICATION Right 05/14/2013   Procedure: HOLMIUM LASER APPLICATION;  Surgeon: Sebastian Ache, MD;  Location: [facility];  Service: Urology;  Laterality: Right;    Current Medications: Current Meds  Medication Sig   alfuzosin (UROXATRAL) 10 MG 24 hr tablet TAKE 1 TABLET BY MOUTH DAILY  WITH BREAKFAST (Patient taking differently: Take 10 mg by mouth daily.)   fexofenadine  (ALLEGRA) 60 MG tablet Take 60 mg by mouth as needed.    fluticasone (FLONASE) 50 MCG/ACT nasal spray Place 1 spray into both nostrils 2 (two) times daily.   Mometasone Furoate (ALLERGY NASAL SPRAY NA) Place into the nose daily as needed.   omeprazole (PRILOSEC) 20 MG capsule TAKE 1 CAPSULE BY MOUTH DAILY   predniSONE (DELTASONE) 20 MG tablet 2 tabs po every day x 5d   rosuvastatin (CRESTOR) 5 MG tablet TAKE 1 TABLET BY MOUTH DAILY   sildenafil (VIAGRA) 100 MG tablet Take 1 tablet (100 mg total) by mouth daily as needed for erectile dysfunction.     Allergies:   Penicillins and Sulfa antibiotics   Social History   Socioeconomic History   Marital status: Married    Spouse name: Not on file   Number of children: 2   Years of education: Not on file   Highest education level: Bachelor's degree (e.g., BA, AB, BS)  Occupational History   Occupation: Administrator, arts: [employer]  Tobacco Use   Smoking status: Never   Smokeless tobacco: Never  Vaping Use   Vaping status: Never Used  Substance and Sexual Activity   Alcohol use: Yes    Comment: rare   Drug use: No   Sexual activity: Not on file    Comment: vasectomy  Other Topics Concern   Not on file  Social History Narrative   Married. 2 daughters. 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 for [employer]      Hobbies: workaholic    Social Drivers of [employer]: Low Risk  (05/12/2022)   Overall Financial Resource Strain (CARDIA)    Difficulty of Paying Living Expenses: Not very hard  Food Insecurity: No Food Insecurity (05/12/2022)   Hunger Vital Sign    Worried About Running Out of Food in the Last Year: Never true    Ran Out of Food in the Last Year: Never  true  Transportation Needs: No Transportation Needs (05/12/2022)   PRAPARE - Administrator, Civil Service (Medical): No    Lack of Transportation (Non-Medical): No  Physical Activity: Insufficiently Active (05/12/2022)   Exercise Vital Sign    Days of Exercise per Week: 1 day    Minutes of Exercise per Session: 10 min  Stress: No Stress Concern Present (05/12/2022)   [employer] of Occupational Health - Occupational Stress Questionnaire    Feeling of Stress : Not at all  Social Connections: Moderately Integrated (  05/12/2022)   Social Connection and Isolation Panel [NHANES]    Frequency of Communication with Friends and Family: Once a week    Frequency of Social Gatherings with Friends and Family: Once a week    Attends Religious Services: More than 4 times per year    Active Member of [employer] or Organizations: Yes    Attends Banker Meetings: 1 to 4 times per year    Marital Status: Married     Family History: The patient's family history includes Arthritis in his mother; Cancer in his father; Diabetes in his maternal grandfather and paternal grandfather; Heart disease in an other family member. There is no history of Colon cancer, Colon polyps, Esophageal cancer, Stomach cancer, or Rectal cancer.  ROS:   Please see the history of present illness.     All other systems reviewed and are negative.  EKGs/Labs/Other Studies Reviewed:    The following studies were reviewed today:   EKG:    EKG Interpretation Date/Time:  Tuesday August 28 2023 08:28:14 EDT Ventricular Rate:  89 PR Interval:  146 QRS Duration:  100 QT Interval:  368 QTC Calculation: 447 R Axis:   7  Text Interpretation: Normal sinus rhythm Normal ECG When compared with ECG of 31-Dec-2006 16:01, No significant change was found Confirmed by Kristeen Miss ([id number]) on 08/28/2023 8:55:36 AM    Recent Labs: 02/20/2023: ALT 29; BUN 14; Creatinine, Ser 0.92; Hemoglobin 14.4; Platelets 238.0;  Potassium 4.4; Sodium 143  Recent Lipid Panel    Component Value Date/Time   CHOL 134 02/20/2023 0836   CHOL 129 04/18/2021 0930   TRIG 69.0 02/20/2023 0836   HDL 57.70 02/20/2023 0836   HDL 52 04/18/2021 0930   CHOLHDL 2 02/20/2023 0836   VLDL 13.8 02/20/2023 0836   LDLCALC 63 02/20/2023 0836   LDLCALC 58 04/18/2021 0930   LDLCALC 101 (H) 02/10/2020 1004     Risk Assessment/Calculations:           Physical Exam:       Physical Exam: Blood pressure 112/66, pulse 93, height 6' (1.829 m), weight 194 lb (88 kg), SpO2 97%.       GEN:  Well nourished, well developed in no acute distress HEENT: Normal NECK: No JVD; No carotid bruits LYMPHATICS: No lymphadenopathy CARDIAC: RRR   RESPIRATORY:  Clear to auscultation without rales, wheezing or rhonchi  ABDOMEN: Soft, non-tender, non-distended MUSCULOSKELETAL:  No edema; No deformity  SKIN: Warm and dry NEUROLOGIC:  Alert and oriented x 3      ASSESSMENT:    1. Other hyperlipidemia       PLAN:       CAD :  has mild CAD .  LDL looks good -last LDL is 54.   I encouraged him to restart his ASA 81 mg a day     2  Hyperlipidemia :   Lipids look great.   LDL is 63 ( goal LDL is < 70) He will check his lipids , ALT with Dr. Durene Cal .     Medication Adjustments/Labs and Tests Ordered: Current medicines are reviewed at length with the patient today.  Concerns regarding medicines are outlined above.  Orders Placed This Encounter  Procedures   EKG 12-Lead   No orders of the defined types were placed in this encounter.    Patient Instructions  Follow-Up: At [facility], you and your health needs are our priority.  As part of our continuing mission to provide you with exceptional  heart care, our providers are all part of one team.  This team includes your primary Cardiologist (physician) and Advanced Practice Providers or APPs (Physician Assistants and Nurse Practitioners) who all work together to  provide you with the care you need, when you need it.  Your next appointment:   1 year(s)  Provider:   Kristeen Miss, MD   Tessa Lerner, MD)         1st Floor: - Lobby - Registration  - Pharmacy  - Lab - Cafe  2nd Floor: - PV Lab - Diagnostic Testing (echo, CT, nuclear med)  3rd Floor: - Vacant  4th Floor: - TCTS (cardiothoracic surgery) - AFib Clinic - Structural Heart Clinic - Vascular Surgery  - Vascular Ultrasound  5th Floor: - HeartCare Cardiology (general and EP) - Clinical Pharmacy for coumadin, hypertension, lipid, weight-loss medications, and med management appointments    Valet parking services will be available as well.     Signed, Kristeen Miss, MD  08/28/2023 5:27 PM    [facility]

## 2023-08-28 ENCOUNTER — Ambulatory Visit: Payer: 59 | Attending: Cardiovascular Disease | Admitting: Cardiovascular Disease

## 2023-08-28 ENCOUNTER — Encounter: Payer: Self-pay | Admitting: Cardiovascular Disease

## 2023-08-28 VITALS — BP 112/66 | HR 93 | Ht 72.0 in | Wt 194.0 lb

## 2023-08-28 DIAGNOSIS — E7849 Other hyperlipidemia: Secondary | ICD-10-CM | POA: Diagnosis not present

## 2023-08-28 DIAGNOSIS — I2510 Atherosclerotic heart disease of native coronary artery without angina pectoris: Secondary | ICD-10-CM | POA: Diagnosis not present

## 2023-08-28 NOTE — Patient Instructions (Signed)
 Follow-Up: At [facility], you and your health needs are our priority.  As part of our continuing mission to provide you with exceptional heart care, our providers are all part of one team.  This team includes your primary Cardiologist (physician) and Advanced Practice Providers or APPs (Physician Assistants and Nurse Practitioners) who all work together to provide you with the care you need, when you need it.  Your next appointment:   1 year(s)  Provider:   Kristeen Miss, MD   Tessa Lerner, MD)         1st Floor: - Lobby - Registration  - Pharmacy  - Lab - Cafe  2nd Floor: - PV Lab - Diagnostic Testing (echo, CT, nuclear med)  3rd Floor: - Vacant  4th Floor: - TCTS (cardiothoracic surgery) - AFib Clinic - Structural Heart Clinic - Vascular Surgery  - Vascular Ultrasound  5th Floor: - HeartCare Cardiology (general and EP) - Clinical Pharmacy for coumadin, hypertension, lipid, weight-loss medications, and med management appointments    Valet parking services will be available as well.

## 2023-09-05 ENCOUNTER — Encounter (HOSPITAL_BASED_OUTPATIENT_CLINIC_OR_DEPARTMENT_OTHER): Payer: Self-pay

## 2023-09-05 ENCOUNTER — Other Ambulatory Visit: Payer: Self-pay

## 2023-09-05 DIAGNOSIS — N200 Calculus of kidney: Secondary | ICD-10-CM | POA: Diagnosis not present

## 2023-09-05 DIAGNOSIS — Z7982 Long term (current) use of aspirin: Secondary | ICD-10-CM | POA: Insufficient documentation

## 2023-09-05 DIAGNOSIS — R109 Unspecified abdominal pain: Secondary | ICD-10-CM | POA: Diagnosis present

## 2023-09-05 LAB — CBC
HCT: 41.8 % (ref 39.0–52.0)
Hemoglobin: 14.2 g/dL (ref 13.0–17.0)
MCH: 30.7 pg (ref 26.0–34.0)
MCHC: 34 g/dL (ref 30.0–36.0)
MCV: 90.5 fL (ref 80.0–100.0)
Platelets: 251 10*3/uL (ref 150–400)
RBC: 4.62 MIL/uL (ref 4.22–5.81)
RDW: 12.8 % (ref 11.5–15.5)
WBC: 6.8 10*3/uL (ref 4.0–10.5)
nRBC: 0 % (ref 0.0–0.2)

## 2023-09-05 NOTE — ED Triage Notes (Signed)
 Pt. Report lower/ mid back pain on left side that started an hour ago. Pt. Has had this pain before due to kidney stones.

## 2023-09-06 ENCOUNTER — Emergency Department (HOSPITAL_BASED_OUTPATIENT_CLINIC_OR_DEPARTMENT_OTHER)
Admission: EM | Admit: 2023-09-06 | Discharge: 2023-09-06 | Disposition: A | Discharge status: Home / Self Care | Attending: Emergency Medicine | Admitting: Emergency Medicine

## 2023-09-06 ENCOUNTER — Emergency Department (HOSPITAL_BASED_OUTPATIENT_CLINIC_OR_DEPARTMENT_OTHER)

## 2023-09-06 DIAGNOSIS — N200 Calculus of kidney: Secondary | ICD-10-CM

## 2023-09-06 LAB — COMPREHENSIVE METABOLIC PANEL WITH GFR
ALT: 33 U/L (ref 0–44)
AST: 30 U/L (ref 15–41)
Albumin: 3.8 g/dL (ref 3.5–5.0)
Alkaline Phosphatase: 93 U/L (ref 38–126)
Anion gap: 9 (ref 5–15)
BUN: 15 mg/dL (ref 6–20)
CO2: 25 mmol/L (ref 22–32)
Calcium: 9.3 mg/dL (ref 8.9–10.3)
Chloride: 103 mmol/L (ref 98–111)
Creatinine, Ser: 1.22 mg/dL (ref 0.61–1.24)
GFR, Estimated: >60 mL/min (ref >60)
Glucose, Bld: 170 mg/dL — ABNORMAL HIGH (ref 70–99)
Potassium: 3.9 mmol/L (ref 3.5–5.1)
Sodium: 137 mmol/L (ref 135–145)
Total Bilirubin: 0.7 mg/dL (ref 0.0–1.2)
Total Protein: 7.2 g/dL (ref 6.5–8.1)

## 2023-09-06 LAB — URINALYSIS, ROUTINE W REFLEX MICROSCOPIC
Bilirubin Urine: NEGATIVE
Glucose, UA: NEGATIVE mg/dL
Ketones, ur: NEGATIVE mg/dL
Leukocytes,Ua: NEGATIVE
Nitrite: NEGATIVE
Protein, ur: 100 mg/dL — AB
Specific Gravity, Urine: >=1.03 (ref 1.005–1.030)
pH: 6 (ref 5.0–8.0)

## 2023-09-06 LAB — URINALYSIS, MICROSCOPIC (REFLEX)

## 2023-09-06 MED ORDER — MORPHINE SULFATE (PF) 4 MG/ML IV SOLN
4.0000 mg | Freq: Once | INTRAVENOUS | Status: AC
  Administered 2023-09-06: 4 mg via INTRAVENOUS
  Filled 2023-09-06: qty 1

## 2023-09-06 MED ORDER — KETOROLAC TROMETHAMINE 30 MG/ML IJ SOLN
30.0000 mg | Freq: Once | INTRAMUSCULAR | Status: AC
  Administered 2023-09-06: 30 mg via INTRAVENOUS
  Filled 2023-09-06: qty 1

## 2023-09-06 MED ORDER — OXYCODONE-ACETAMINOPHEN 5-325 MG PO TABS
2.0000 | ORAL_TABLET | Freq: Once | ORAL | Status: AC
  Administered 2023-09-06: 2 via ORAL
  Filled 2023-09-06: qty 2

## 2023-09-06 MED ORDER — ONDANSETRON HCL 4 MG/2ML IJ SOLN
4.0000 mg | Freq: Once | INTRAMUSCULAR | Status: AC
  Administered 2023-09-06: 4 mg via INTRAVENOUS
  Filled 2023-09-06: qty 2

## 2023-09-06 MED ORDER — OXYCODONE-ACETAMINOPHEN 5-325 MG PO TABS: 1.0000 | ORAL_TABLET | Freq: Four times a day (QID) | ORAL | 0 refills | Status: DC | PRN

## 2023-09-06 MED ORDER — OXYCODONE-ACETAMINOPHEN 5-325 MG PO TABS
1.0000 | ORAL_TABLET | Freq: Once | ORAL | Status: AC
  Administered 2023-09-06: 1 via ORAL
  Filled 2023-09-06: qty 1

## 2023-09-06 NOTE — ED Provider Notes (Signed)
 [facility] Provider Note   CSN: [id number] Arrival date & time: 09/05/23  2323     History  Chief Complaint  Patient presents with   Back Pain    Lucas Rocha is a 58 y.o. male.  Patient is a 58 year old male presenting with complaints of left flank pain.  This started suddenly this evening at approximately 930.  He has a history of kidney stones and this feels similar.  He denies any blood in his urine.  No fevers or chills.  No injury or trauma.  No aggravating or alleviating factors.       Home Medications Prior to Admission medications   Medication Sig Start Date End Date Taking? Authorizing Provider  alfuzosin (UROXATRAL) 10 MG 24 hr tablet TAKE 1 TABLET BY MOUTH DAILY  WITH BREAKFAST Patient taking differently: Take 10 mg by mouth daily. 07/18/22   Shelva Majestic, MD  aspirin EC 81 MG tablet Take 1 tablet (81 mg total) by mouth daily. Swallow whole. Patient not taking: Reported on 08/28/2023 04/18/21   Nahser, Deloris Ping, MD  fexofenadine (ALLEGRA) 60 MG tablet Take 60 mg by mouth as needed.     [provider]  fluticasone (FLONASE) 50 MCG/ACT nasal spray Place 1 spray into both nostrils 2 (two) times daily. 10/23/22   [provider]  Mometasone Furoate (ALLERGY NASAL SPRAY NA) Place into the nose daily as needed.    [provider]  omeprazole (PRILOSEC) 20 MG capsule TAKE 1 CAPSULE BY MOUTH DAILY 03/16/23   Shelva Majestic, MD  predniSONE (DELTASONE) 20 MG tablet 2 tabs po every day x 5d 05/29/23   McGowen, Maryjean Morn, MD  rosuvastatin (CRESTOR) 5 MG tablet TAKE 1 TABLET BY MOUTH DAILY 03/26/23   Shelva Majestic, MD  sildenafil (VIAGRA) 100 MG tablet Take 1 tablet (100 mg total) by mouth daily as needed for erectile dysfunction. 02/16/22   Shelva Majestic, MD      Allergies    Penicillins and Sulfa antibiotics    Review of Systems   Review of Systems  All other systems reviewed and are  negative.   Physical Exam Updated Vital Signs BP (!) 153/95   Pulse 66   Temp (!) 97.4 F (36.3 C) (Oral)   Resp 18   SpO2 100%  Physical Exam Vitals and nursing note reviewed.  Constitutional:      General: He is not in acute distress.    Appearance: He is well-developed. He is not diaphoretic.  HENT:     Head: Normocephalic and atraumatic.  Cardiovascular:     Rate and Rhythm: Normal rate and regular rhythm.     Heart sounds: No murmur heard.    No friction rub.  Pulmonary:     Effort: Pulmonary effort is normal. No respiratory distress.     Breath sounds: Normal breath sounds. No wheezing or rales.  Abdominal:     General: Bowel sounds are normal. There is no distension.     Palpations: Abdomen is soft.     Tenderness: There is no abdominal tenderness. There is left CVA tenderness. There is no right CVA tenderness, guarding or rebound.  Musculoskeletal:        General: Normal range of motion.     Cervical back: Normal range of motion and neck supple.  Skin:    General: Skin is warm and dry.  Neurological:     Mental Status: He is alert and  oriented to person, place, and time.     Coordination: Coordination normal.     ED Results / Procedures / Treatments   Labs (all labs ordered are listed, but only abnormal results are displayed) Labs Reviewed  COMPREHENSIVE METABOLIC PANEL WITH GFR - Abnormal; Notable for the following components:      Result Value   Glucose, Bld 170 (*)    All other components within normal limits  URINALYSIS, ROUTINE W REFLEX MICROSCOPIC - Abnormal; Notable for the following components:   Hgb urine dipstick LARGE (*)    Protein, ur 100 (*)    All other components within normal limits  URINALYSIS, MICROSCOPIC (REFLEX) - Abnormal; Notable for the following components:   Bacteria, UA FEW (*)    All other components within normal limits  CBC    EKG None  Radiology No results found.  Procedures Procedures    Medications Ordered in  ED Medications  morphine (PF) 4 MG/ML injection 4 mg (has no administration in time range)  ketorolac (TORADOL) 30 MG/ML injection 30 mg (has no administration in time range)  ondansetron (ZOFRAN) injection 4 mg (has no administration in time range)  oxyCODONE-acetaminophen (PERCOCET/ROXICET) 5-325 MG per tablet 1 tablet (1 tablet Oral Given 09/06/23 0024)    ED Course/ Medical Decision Making/ A&P  Patient is a 58 year old male presenting with left flank pain as described in the HPI.  Patient arrives with stable vital signs and is afebrile.  He has left-sided CVA tenderness, but benign abdomen.  Laboratory studies obtained including CBC, CMP, and urinalysis.  He has microscopic hematuria, but lab studies otherwise unremarkable.  CT scan with renal protocol shows a 2 mm stone in the distal left ureter with hydronephrosis.  Patient was given Toradol and morphine for pain and Zofran for nausea and is now feeling considerably improved.  He was also given 2 Percocet prior to discharge.  Patient will be discharged with pain medication and as needed follow-up with urology.  Final Clinical Impression(s) / ED Diagnoses Final diagnoses:  None    Rx / DC Orders ED Discharge Orders     None         Geoffery Lyons, MD 09/06/23 [phone]

## 2023-09-06 NOTE — ED Notes (Signed)
 Pt ambulated to restroom. Reports he feels better. Urine strainer given.

## 2023-09-06 NOTE — Discharge Instructions (Addendum)
 Begin taking Percocet as prescribed as needed for pain.  Follow-up with urology if symptoms are not improving in the next few days.  The contact information for [facility] has been provided in this discharge summary for you to call and make these arrangements.  Return to the emergency department if your symptoms significantly worsen or change.

## 2023-09-19 ENCOUNTER — Other Ambulatory Visit: Payer: Self-pay | Admitting: Family Medicine

## 2023-09-20 ENCOUNTER — Other Ambulatory Visit: Payer: Self-pay | Admitting: *Deleted

## 2023-09-20 ENCOUNTER — Ambulatory Visit: Admitting: Physician Assistant

## 2023-09-20 ENCOUNTER — Ambulatory Visit: Payer: Self-pay

## 2023-09-20 ENCOUNTER — Encounter: Payer: Self-pay | Admitting: Physician Assistant

## 2023-09-20 ENCOUNTER — Ambulatory Visit (HOSPITAL_COMMUNITY)
Admission: RE | Admit: 2023-09-20 | Discharge: 2023-09-20 | Disposition: A | Discharge status: Home / Self Care | Source: Ambulatory Visit | Attending: Family Medicine | Admitting: Family Medicine

## 2023-09-20 ENCOUNTER — Other Ambulatory Visit: Payer: Self-pay | Admitting: Family Medicine

## 2023-09-20 ENCOUNTER — Encounter: Payer: Self-pay | Admitting: Family Medicine

## 2023-09-20 ENCOUNTER — Ambulatory Visit (INDEPENDENT_AMBULATORY_CARE_PROVIDER_SITE_OTHER)

## 2023-09-20 ENCOUNTER — Ambulatory Visit (HOSPITAL_COMMUNITY)

## 2023-09-20 VITALS — BP 100/68 | HR 101 | Temp 98.6°F | Ht 72.0 in | Wt 189.0 lb

## 2023-09-20 DIAGNOSIS — R7989 Other specified abnormal findings of blood chemistry: Secondary | ICD-10-CM | POA: Diagnosis not present

## 2023-09-20 DIAGNOSIS — R5381 Other malaise: Secondary | ICD-10-CM

## 2023-09-20 DIAGNOSIS — E559 Vitamin D deficiency, unspecified: Secondary | ICD-10-CM | POA: Diagnosis not present

## 2023-09-20 DIAGNOSIS — D509 Iron deficiency anemia, unspecified: Secondary | ICD-10-CM

## 2023-09-20 DIAGNOSIS — D72829 Elevated white blood cell count, unspecified: Secondary | ICD-10-CM

## 2023-09-20 DIAGNOSIS — R5383 Other fatigue: Secondary | ICD-10-CM | POA: Diagnosis not present

## 2023-09-20 DIAGNOSIS — R252 Cramp and spasm: Secondary | ICD-10-CM | POA: Diagnosis not present

## 2023-09-20 LAB — CK: Total CK: 65 U/L (ref 7–232)

## 2023-09-20 LAB — COMPREHENSIVE METABOLIC PANEL WITH GFR
ALT: 20 U/L (ref 0–53)
AST: 18 U/L (ref 0–37)
Albumin: 4 g/dL (ref 3.5–5.2)
Alkaline Phosphatase: 88 U/L (ref 39–117)
BUN: 13 mg/dL (ref 6–23)
CO2: 27 meq/L (ref 19–32)
Calcium: 9.6 mg/dL (ref 8.4–10.5)
Chloride: 101 meq/L (ref 96–112)
Creatinine, Ser: 1.1 mg/dL (ref 0.40–1.50)
GFR: 75.4 mL/min (ref >60.00)
Glucose, Bld: 120 mg/dL — ABNORMAL HIGH (ref 70–99)
Potassium: 4 meq/L (ref 3.5–5.1)
Sodium: 137 meq/L (ref 135–145)
Total Bilirubin: 0.7 mg/dL (ref 0.2–1.2)
Total Protein: 6.9 g/dL (ref 6.0–8.3)

## 2023-09-20 LAB — CBC WITH DIFFERENTIAL/PLATELET
Basophils Absolute: 0.1 10*3/uL (ref 0.0–0.1)
Basophils Relative: 0.4 % (ref 0.0–3.0)
Eosinophils Absolute: 0 10*3/uL (ref 0.0–0.7)
Eosinophils Relative: 0.2 % (ref 0.0–5.0)
HCT: 43.8 % (ref 39.0–52.0)
Hemoglobin: 14.8 g/dL (ref 13.0–17.0)
Lymphocytes Relative: 9.2 % — ABNORMAL LOW (ref 12.0–46.0)
Lymphs Abs: 1.4 10*3/uL (ref 0.7–4.0)
MCHC: 33.7 g/dL (ref 30.0–36.0)
MCV: 90.7 fl (ref 78.0–100.0)
Monocytes Absolute: 0.4 10*3/uL (ref 0.1–1.0)
Monocytes Relative: 2.4 % — ABNORMAL LOW (ref 3.0–12.0)
Neutro Abs: 13.2 10*3/uL — ABNORMAL HIGH (ref 1.4–7.7)
Neutrophils Relative %: 87.8 % — ABNORMAL HIGH (ref 43.0–77.0)
Platelets: 333 10*3/uL (ref 150.0–400.0)
RBC: 4.83 Mil/uL (ref 4.22–5.81)
RDW: 13.8 % (ref 11.5–15.5)
WBC: 15.1 10*3/uL — ABNORMAL HIGH (ref 4.0–10.5)

## 2023-09-20 LAB — POC URINALSYSI DIPSTICK (AUTOMATED)
Bilirubin, UA: NEGATIVE
Blood, UA: NEGATIVE
Glucose, UA: NEGATIVE
Ketones, UA: NEGATIVE
Leukocytes, UA: NEGATIVE
Nitrite, UA: NEGATIVE
Protein, UA: NEGATIVE
Spec Grav, UA: 1.015 (ref 1.010–1.025)
Urobilinogen, UA: 0.2 U/dL
pH, UA: 6.5 (ref 5.0–8.0)

## 2023-09-20 LAB — POCT CBG (FASTING - GLUCOSE)-MANUAL ENTRY: Glucose Fasting, POC: 154 mg/dL — AB (ref 70–99)

## 2023-09-20 LAB — VITAMIN D 25 HYDROXY (VIT D DEFICIENCY, FRACTURES): VITD: 23.76 ng/mL — ABNORMAL LOW (ref 30.00–100.00)

## 2023-09-20 LAB — IBC + FERRITIN
Ferritin: 38.1 ng/mL (ref 22.0–322.0)
Iron: 72 ug/dL (ref 42–165)
Saturation Ratios: 19.7 % — ABNORMAL LOW (ref 20.0–50.0)
TIBC: 365.4 ug/dL (ref 250.0–450.0)
Transferrin: 261 mg/dL (ref 212.0–360.0)

## 2023-09-20 LAB — TSH: TSH: 0.48 u[IU]/mL (ref 0.35–5.50)

## 2023-09-20 LAB — VITAMIN B12: Vitamin B-12: 360 pg/mL (ref 211–911)

## 2023-09-20 MED ORDER — IOHEXOL 350 MG/ML SOLN
75.0000 mL | Freq: Once | INTRAVENOUS | Status: AC | PRN
  Administered 2023-09-20: 75 mL via INTRAVENOUS

## 2023-09-20 MED ORDER — LEVOFLOXACIN 750 MG PO TABS
750.0000 mg | ORAL_TABLET | Freq: Every day | ORAL | 0 refills | Status: DC
Start: 2023-09-20 — End: 2023-10-15

## 2023-09-20 NOTE — Patient Instructions (Signed)
 It was great to see you!  Liquid IV at home followed by a ton of water    Urgent Care by [facility] and [facility] administer IV fluids  We will be in touch with all results  If new/worsening symptom(s), please go to urgent care or ER   ake care,  Alexander Iba PA-C

## 2023-09-20 NOTE — Progress Notes (Addendum)
 Lucas Rocha is a 58 y.o. male here for a new problem.  History of Present Illness:   Chief Complaint  Patient presents with   Muscle cramps    Pt c/o feeling dehydrated, muscle cramps off and on x 2 weeks, fatigue     HPI  Muscle Cramps // Malaise and Fatigue Patient complains of feeling dehydrated and fatigued.  Associated symptoms include sinus pressure, very slight cough, and waxing and waning muscle cramps, mostly in his lower extremities that have persisted for the past 2 weeks. He specifically states that he has right calf pain and behind the knee pain. No swelling/erythema/warmth. No history of prior clots.  He states that he recently has not been getting in adequate nutrition due to his new assignment at his job which has been catching up to him.   Patient has lost about 5 months since the beginning of the month and reports having a kidney stone about 2 weeks ago. Doesn't believe he has passed it yet. Pain is currently tolerable with 2 tylenol  500 mg daily each morning.  CT scan on 09/06/23 showed A 2 mm obstructing renal calculus is seen within the distal left ureter  with mild left-sided hydronephrosis and hydroureter. He has an appointment with urology at end of May.  Patient states that he consumed liquid IV last week and felt much better.   His wife states he has a history of low iron levels few years back, this was evaluated and there wasn't any determination of cause and states that he has woken her up twice shivering at night. With most recent episode occurring last night.   Denies any dark stool, SOB, chest pain, urgency or blood in stool. Requesting to check his B12 today. Will also check vitamin D .    Glucose today is 154, did have a soft drink this morning.  BP today is 100/68. Typical BP runs around 118/93   Past Medical History:  Diagnosis Date   Allergic rhinitis    Allergy Child   Penicillin sulfs drugs   BPH (benign prostatic hyperplasia)     Coronary artery calcification seen on CAT scan    05-03-2021  calcium  score = 54.9, all located LAD/ D1  (cardiologist-- dr Floria Hurst)   ED (erectile dysfunction)    GERD (gastroesophageal reflux disease)    Hiatal hernia    History of iron deficiency anemia    History of kidney stones    Hyperlipidemia    Lower urinary tract symptoms (LUTS)    OSA on CPAP    01-08-2023  per pt uses nightly   RBBB (right bundle branch block)    Renal calculi 08/2021   right side   Sleep apnea 2020     Social History   Tobacco Use   Smoking status: Never   Smokeless tobacco: Never  Vaping Use   Vaping status: Never Used  Substance Use Topics   Alcohol use: Yes    Comment: rare   Drug use: No    Past Surgical History:  Procedure Laterality Date   COLONOSCOPY WITH PROPOFOL   05/24/2018   by dr Bridgett Camps   CYSTOSCOPY WITH RETROGRADE PYELOGRAM, URETEROSCOPY AND STENT PLACEMENT Right 05/14/2013   Procedure: CYSTOSCOPY WITH RETROGRADE PYELOGRAM, URETEROSCOPY AND STENT PLACEMENT;  Surgeon: Osborn Blaze, MD;  Location: [facility];  Service: Urology;  Laterality: Right;   CYSTOSCOPY WITH RETROGRADE PYELOGRAM, URETEROSCOPY AND STENT PLACEMENT Right 09/20/2021   Procedure: CYSTOSCOPY WITH RIGHT RETROGRADE PYELOGRAM, URETEROSCOPY AND STENT PLACEMENT  POSSIBLE HOLMIUM LASER LITHOTRIPSY;  Surgeon: Homero Luster, MD;  Location: [facility];  Service: Urology;  Laterality: Right;   CYSTOSCOPY/URETEROSCOPY/HOLMIUM LASER/STENT PLACEMENT Right 01/16/2023   Procedure: CYSTOSCOPY RIGHT RETROGRADE PYELOGRAMURETEROSCOPY/HOLMIUM LASER/STENT PLACEMENT;  Surgeon: Homero Luster, MD;  Location: [facility];  Service: Urology;  Laterality: Right;  1 HR FOR CASE   ESOPHAGOGASTRODUODENOSCOPY  11/28/2011   by dr pyrtle   EXTRACORPOREAL SHOCK WAVE LITHOTRIPSY Right 02/13/2013   @[facility]    FOOT GANGLION EXCISION Left 1999   HOLMIUM LASER APPLICATION Right 05/14/2013   Procedure: HOLMIUM LASER  APPLICATION;  Surgeon: Osborn Blaze, MD;  Location: [facility];  Service: Urology;  Laterality: Right;    Family History  Problem Relation Age of Onset   Arthritis Mother    Cancer Father        unknown- not worked up 76   Diabetes Maternal Grandfather    Heart disease Other        Dad's side - grandfather, grandmother (big MI)   Diabetes Paternal Grandfather    Colon cancer Neg Hx    Colon polyps Neg Hx    Esophageal cancer Neg Hx    Stomach cancer Neg Hx    Rectal cancer Neg Hx     Allergies  Allergen Reactions   Penicillins Hives     AND HYPERACTIVITY   Sulfa Antibiotics     Unknown childhood reaction    Current Medications:   Current Outpatient Medications:    alfuzosin  (UROXATRAL ) 10 MG 24 hr tablet, TAKE 1 TABLET BY MOUTH DAILY  WITH BREAKFAST, Disp: 90 tablet, Rfl: 3   aspirin  EC 81 MG tablet, Take 1 tablet (81 mg total) by mouth daily. Swallow whole., Disp: 90 tablet, Rfl: 3   fexofenadine (ALLEGRA) 60 MG tablet, Take 60 mg by mouth as needed. , Disp: , Rfl:    fluticasone  (FLONASE ) 50 MCG/ACT nasal spray, Place 1 spray into both nostrils 2 (two) times daily., Disp: , Rfl:    Mometasone Furoate (ALLERGY NASAL SPRAY NA), Place into the nose daily as needed., Disp: , Rfl:    omeprazole  (PRILOSEC) 20 MG capsule, TAKE 1 CAPSULE BY MOUTH DAILY, Disp: 90 capsule, Rfl: 3   rosuvastatin  (CRESTOR ) 5 MG tablet, TAKE 1 TABLET BY MOUTH DAILY, Disp: 90 tablet, Rfl: 3   sildenafil  (VIAGRA ) 100 MG tablet, Take 1 tablet (100 mg total) by mouth daily as needed for erectile dysfunction., Disp: 10 tablet, Rfl: 11   Review of Systems:   Review of Systems  Constitutional:  Positive for malaise/fatigue.  HENT:  Positive for sinus pain.   Respiratory:  Positive for cough. Negative for shortness of breath.   Cardiovascular:  Negative for chest pain.  Gastrointestinal:  Negative for blood in stool and melena.  Genitourinary:  Negative for urgency.  Musculoskeletal:   Positive for joint pain.  Neurological:  Positive for tingling.    Vitals:   Vitals:   09/20/23 0913  BP: 100/68  Pulse: (!) 101  Temp: 98.6 F (37 C)  TempSrc: Temporal  SpO2: 94%  Weight: 189 lb (85.7 kg)  Height: 6' (1.829 m)     Body mass index is 25.63 kg/m.  Physical Exam:   Physical Exam Vitals and nursing note reviewed.  Constitutional:      Appearance: He is well-developed.  HENT:     Head: Normocephalic.  Eyes:     Conjunctiva/sclera: Conjunctivae normal.     Pupils: Pupils are equal, round, and reactive to light.  Pulmonary:     Effort: Pulmonary effort is normal.  Musculoskeletal:        General: Normal range of motion.     Cervical back: Normal range of motion.     Comments: No calf tenderness to palpation or swelling appreciated  Skin:    General: Skin is warm and dry.  Neurological:     Mental Status: He is alert and oriented to person, place, and time.  Psychiatric:        Behavior: Behavior normal.        Thought Content: Thought content normal.        Judgment: Judgment normal.     Assessment and Plan:   Malaise and fatigue Unclear etiology He does have some dehydration suspected due to amber-colored urine, low blood pressure (compared to his baseline) We will order blood work for further evaluation I did discuss that if we do see significant dehydration on blood work that we are likely going to refer him to urgent care or the emergency room for some IV fluids In the meantime I recommended that he trial some liquid IV and then copious amounts of water  with some nutrition with protein while awaiting lab results  We did consider x-ray and EKG however patient is not experiencing any chest pain, shortness of breath, lower extremity swelling.  Also, we may need to consider repeat ct renal stone protocol because he is certain he has not passed stone and two weeks ago he had "A 2 mm obstructing renal calculus is seen within the distal left ureter   with mild left-sided hydronephrosis and hydroureter."  Muscle cramps Given significant right leg pain history, increased heart rate and reduced SpO2 today will order stat D-dimer Patient and wife verbalized understanding that if this is elevated we will likely proceed with CTA chest and bilateral venous lower extremity ultrasounds Will also check a CK and consider holding statin if indicated   Iron deficiency anemia, unspecified iron deficiency anemia type Will update iron stores and B12 per patient and wife's request   Vitamin D  deficiency Update vitamin D  per patient and wife's request  Discussed with patient and wife that should symptoms worsen in the interim he needs to go to the emergency room.   Alexander Iba, PA-C  I,Safa M Kadhim,acting as a scribe for Alexander Iba, PA.,have documented all relevant documentation on the behalf of Alexander Iba, PA,as directed by  Alexander Iba, PA while in the presence of Alexander Iba, [place].   I, Alexander Iba, [place], have reviewed all documentation for this visit. The documentation on 09/20/23 for the exam, diagnosis, procedures, and orders are all accurate and complete.   I spent a total of 46 minutes on this visit, today 09/20/23, which included reviewing previous notes from Primary Care Provider (PCP), cardiology, reviewing care plan with Dr Clarisa Crooked, ordering tests, discussing plan of care with patient and using shared-decision making on next steps, refilling medications, and documenting the findings in the note.

## 2023-09-20 NOTE — Telephone Encounter (Signed)
 Copied from CRM [id number]. Topic: Clinical - Red Word Triage >> Sep 20, 2023  8:03 AM Freya Jesus wrote: Red Word that prompted transfer to Nurse Triage: Patient called said he feels horrible, cramping, dehydrated, feeling weak, chills.   Chief Complaint: Muscle cramps, chills at night Symptoms: chills at night off and on, muscle cramps, feeling dehydrated Frequency: x 2-3 weeks off and on Pertinent Negatives: Patient denies nausea, vomiting, diarrhea, fever, chest pain, difficulty breathing, abdominal pain Disposition: [] ED /[] Urgent Care (no appt availability in office) / [x] Appointment(In office/virtual)/ []  [facility]/ [] Home Care/ [] Refused Recommended Disposition /[] [facility]/ []  Follow-up with PCP Additional Notes: Patient called and states that he is feeling dehydrated, having chills, waking up at night sweating,  Patient states that this was going on prior to a trip to the ER 09/06/2023 for a 2mm kidney that is in his left kidney.  He has a followup coming up with a urologist. Patient states that he has been working and hasn't been eating like he probably should. Patient states he has had some lower back pain since he started lifting ladders at work 6-8 months ago. Patient denies nausea, vomiting, diarrhea, chest pain, abdominal pain, difficulty breathing, abdominal pain, blood in his urine, very dark urine, inability to urinate, being a diabetic, being bitten by a tick in the past months. Patient states he is still urinating. Patient denies ever having issues with his potassium or sodium levels that he is aware of but he states that he has had low iron before. Appointment made with Alexander Iba at patient's PCP office today 07/23/2023 at 9:20 am. Patient is advised that if anything worsens to go to the Emergency Room.  Patient verbalized understanding.    Reason for Disposition  [1] MODERATE pain (e.g., interferes with normal activities) AND [2] present > 3  days  Answer Assessment - Initial Assessment Questions 1. ONSET: "When did the muscle aches or body pains start?"      2-3 weeks ago and it is off and on 2. LOCATION: "What part of your body is hurting?" (e.g., entire body, arms, legs)      Legs and sometimes hands 3. SEVERITY: "How bad is the pain?" (Scale 1-10; or mild, moderate, severe)   - MILD (1-3): doesn't interfere with normal activities    - MODERATE (4-7): interferes with normal activities or awakens from sleep    - SEVERE (8-10):  excruciating pain, unable to do any normal activities      --- 4. CAUSE: "What do you think is causing the pains?"     unsure 5. FEVER: "Have you been having fever?"     No 6. OTHER SYMPTOMS: "Do you have any other symptoms?" (e.g., chest pain, weakness, rash, cold or flu symptoms, weight loss)     Chills at night 8. TRAVEL: "Have you traveled out of the country in the last month?" (e.g., travel history, exposures)     ---  Protocols used: Muscle Aches and Body Pain-A-AH

## 2023-09-21 LAB — URINE CULTURE
MICRO NUMBER:: 16371195
Result:: NO GROWTH
SPECIMEN QUALITY:: ADEQUATE

## 2023-09-21 LAB — D-DIMER, QUANTITATIVE: D-Dimer, Quant: 0.84 ug{FEU}/mL — ABNORMAL HIGH (ref <0.50)

## 2023-10-03 ENCOUNTER — Other Ambulatory Visit: Payer: Self-pay

## 2023-10-03 ENCOUNTER — Telehealth: Payer: Self-pay

## 2023-10-03 DIAGNOSIS — J189 Pneumonia, unspecified organism: Secondary | ICD-10-CM

## 2023-10-03 NOTE — Telephone Encounter (Signed)
 Pt saw Sam 04/24.  Copied from CRM [id number]. Topic: Clinical - Medical Advice >> Oct 03, 2023  8:18 AM Dimple Francis wrote: Reason for CRM: Patient still having lingering cough, wanting to know if that's normal or if there is something else that can be called in to help relieve that

## 2023-10-03 NOTE — Telephone Encounter (Signed)
 X-ray was placed. Patient was informed to call office back to schedule this appointment. Patient stated he is feeling better it's just a lingering cough now.

## 2023-10-03 NOTE — Telephone Encounter (Signed)
 Yes can have leg during cough with pneumonia-as long as not short of breath or worsening cough we can likely give it another week or 2-if not improving at that point could try prednisone   Also please try to set him up with repeat chest x-ray in about a month from today to confirm clearance of pneumonia under pneumonia

## 2023-10-06 ENCOUNTER — Telehealth: Admitting: Family Medicine

## 2023-10-06 DIAGNOSIS — J019 Acute sinusitis, unspecified: Secondary | ICD-10-CM | POA: Diagnosis not present

## 2023-10-06 MED ORDER — GUAIFENESIN 200 MG PO TABS: 400.0000 mg | ORAL_TABLET | ORAL | 0 refills | Status: AC | PRN

## 2023-10-06 MED ORDER — FLUTICASONE PROPIONATE 50 MCG/ACT NA SUSP: 2.0000 | Freq: Every day | NASAL | 0 refills | Status: AC

## 2023-10-06 MED ORDER — BENZONATATE 100 MG PO CAPS: 100.0000 mg | ORAL_CAPSULE | Freq: Three times a day (TID) | ORAL | 0 refills | Status: AC | PRN

## 2023-10-06 MED ORDER — PREDNISONE 10 MG (21) PO TBPK: ORAL_TABLET | ORAL | 0 refills | Status: DC

## 2023-10-06 NOTE — Patient Instructions (Signed)
 Lucas Rocha, thank you for joining Lucas Roys, PA-C for today's virtual visit.  While this provider is not your primary care provider (PCP), if your PCP is located in our provider database this encounter information will be shared with them immediately following your visit.   A [facility] MyChart account gives you access to today's visit and all your visits, tests, and labs performed at [facility] " click here if you don't have a [facility] MyChart account or go to [email or web address]  Consent: (Patient) Lucas Rocha provided verbal consent for this virtual visit at the beginning of the encounter.  Current Medications:  Current Outpatient Medications:    benzonatate (TESSALON) 100 MG capsule, Take 1 capsule (100 mg total) by mouth 3 (three) times daily as needed for up to 7 days., Disp: 21 capsule, Rfl: 0   fluticasone  (FLONASE ) 50 MCG/ACT nasal spray, Place 2 sprays into both nostrils daily., Disp: 16 g, Rfl: 0   guaiFENesin 200 MG tablet, Take 2 tablets (400 mg total) by mouth every 4 (four) hours as needed for up to 7 days for cough or to loosen phlegm., Disp: 30 tablet, Rfl: 0   predniSONE  (STERAPRED UNI-PAK 21 TAB) 10 MG (21) TBPK tablet, Take following package directions., Disp: 21 tablet, Rfl: 0   alfuzosin  (UROXATRAL ) 10 MG 24 hr tablet, TAKE 1 TABLET BY MOUTH DAILY  WITH BREAKFAST, Disp: 90 tablet, Rfl: 3   aspirin  EC 81 MG tablet, Take 1 tablet (81 mg total) by mouth daily. Swallow whole., Disp: 90 tablet, Rfl: 3   fexofenadine (ALLEGRA) 60 MG tablet, Take 60 mg by mouth as needed. , Disp: , Rfl:    levofloxacin  (LEVAQUIN ) 750 MG tablet, Take 1 tablet (750 mg total) by mouth daily., Disp: 5 tablet, Rfl: 0   Mometasone Furoate (ALLERGY NASAL SPRAY NA), Place into the nose daily as needed., Disp: , Rfl:    omeprazole  (PRILOSEC) 20 MG capsule, TAKE 1 CAPSULE BY MOUTH DAILY, Disp: 90 capsule, Rfl: 3   rosuvastatin  (CRESTOR ) 5 MG tablet, TAKE 1 TABLET BY  MOUTH DAILY, Disp: 90 tablet, Rfl: 3   sildenafil  (VIAGRA ) 100 MG tablet, Take 1 tablet (100 mg total) by mouth daily as needed for erectile dysfunction., Disp: 10 tablet, Rfl: 11   Medications ordered in this encounter:  Meds ordered this encounter  Medications   predniSONE  (STERAPRED UNI-PAK 21 TAB) 10 MG (21) TBPK tablet    Sig: Take following package directions.    Dispense:  21 tablet    Refill:  0    Please dispense one standard blister pack taper.   fluticasone  (FLONASE ) 50 MCG/ACT nasal spray    Sig: Place 2 sprays into both nostrils daily.    Dispense:  16 g    Refill:  0   benzonatate (TESSALON) 100 MG capsule    Sig: Take 1 capsule (100 mg total) by mouth 3 (three) times daily as needed for up to 7 days.    Dispense:  21 capsule    Refill:  0   guaiFENesin 200 MG tablet    Sig: Take 2 tablets (400 mg total) by mouth every 4 (four) hours as needed for up to 7 days for cough or to loosen phlegm.    Dispense:  30 tablet    Refill:  0     *If you need refills on other medications prior to your next appointment, please contact your pharmacy*  Follow-Up: Call back or seek an in-person evaluation if the  symptoms worsen or if the condition fails to improve as anticipated.  [facility] [phone]  Other Instructions Sinus Infection, Adult A sinus infection, also called sinusitis, is inflammation of your sinuses. Sinuses are hollow spaces in the bones around your face. Your sinuses are located: Around your eyes. In the middle of your forehead. Behind your nose. In your cheekbones. Mucus normally drains out of your sinuses. When your nasal tissues become inflamed or swollen, mucus can become trapped or blocked. This allows bacteria, viruses, and fungi to grow, which leads to infection. Most infections of the sinuses are caused by a virus. A sinus infection can develop quickly. It can last for up to 4 weeks (acute) or for more than 12 weeks (chronic). A sinus  infection often develops after a cold. What are the causes? This condition is caused by anything that creates swelling in the sinuses or stops mucus from draining. This includes: Allergies. Asthma. Infection from bacteria or viruses. Deformities or blockages in your nose or sinuses. Abnormal growths in the nose (nasal polyps). Pollutants, such as chemicals or irritants in the air. Infection from fungi. This is rare. What increases the risk? You are more likely to develop this condition if you: Have a weak body defense system (immune system). Do a lot of swimming or diving. Overuse nasal sprays. Smoke. What are the signs or symptoms? The main symptoms of this condition are pain and a feeling of pressure around the affected sinuses. Other symptoms include: Stuffy nose or congestion that makes it difficult to breathe through your nose. Thick yellow or greenish drainage from your nose. Tenderness, swelling, and warmth over the affected sinuses. A cough that may get worse at night. Decreased sense of smell and taste. Extra mucus that collects in the throat or the back of the nose (postnasal drip) causing a sore throat or bad breath. Tiredness (fatigue). Fever. How is this diagnosed? This condition is diagnosed based on: Your symptoms. Your medical history. A physical exam. Tests to find out if your condition is acute or chronic. This may include: Checking your nose for nasal polyps. Viewing your sinuses using a device that has a light (endoscope). Testing for allergies or bacteria. Imaging tests, such as an MRI or CT scan. In rare cases, a bone biopsy may be done to rule out more serious types of fungal sinus disease. How is this treated? Treatment for a sinus infection depends on the cause and whether your condition is chronic or acute. If caused by a virus, your symptoms should go away on their own within 10 days. You may be given medicines to relieve symptoms. They  include: Medicines that shrink swollen nasal passages (decongestants). A spray that eases inflammation of the nostrils (topical intranasal corticosteroids). Rinses that help get rid of thick mucus in your nose (nasal saline washes). Medicines that treat allergies (antihistamines). Over-the-counter pain relievers. If caused by bacteria, your health care provider may recommend waiting to see if your symptoms improve. Most bacterial infections will get better without antibiotic medicine. You may be given antibiotics if you have: A severe infection. A weak immune system. If caused by narrow nasal passages or nasal polyps, surgery may be needed. Follow these instructions at home: Medicines Take, use, or apply over-the-counter and prescription medicines only as told by your health care provider. These may include nasal sprays. If you were prescribed an antibiotic medicine, take it as told by your health care provider. Do not stop taking the antibiotic even if  you start to feel better. Hydrate and humidify  Drink enough fluid to keep your urine pale yellow. Staying hydrated will help to thin your mucus. Use a cool mist humidifier to keep the humidity level in your home above 50%. Inhale steam for 10-15 minutes, 3-4 times a day, or as told by your health care provider. You can do this in the bathroom while a hot shower is running. Limit your exposure to cool or dry air. Rest Rest as much as possible. Sleep with your head raised (elevated). Make sure you get enough sleep each night. General instructions  Apply a warm, moist washcloth to your face 3-4 times a day or as told by your health care provider. This will help with discomfort. Use nasal saline washes as often as told by your health care provider. Wash your hands often with soap and water  to reduce your exposure to germs. If soap and water  are not available, use hand sanitizer. Do not smoke. Avoid being around people who are smoking  (secondhand smoke). Keep all follow-up visits. This is important. Contact a health care provider if: You have a fever. Your symptoms get worse. Your symptoms do not improve within 10 days. Get help right away if: You have a severe headache. You have persistent vomiting. You have severe pain or swelling around your face or eyes. You have vision problems. You develop confusion. Your neck is stiff. You have trouble breathing. These symptoms may be an emergency. Get help right away. Call 911. Do not wait to see if the symptoms will go away. Do not drive yourself to the hospital. Summary A sinus infection is soreness and inflammation of your sinuses. Sinuses are hollow spaces in the bones around your face. This condition is caused by nasal tissues that become inflamed or swollen. The swelling traps or blocks the flow of mucus. This allows bacteria, viruses, and fungi to grow, which leads to infection. If you were prescribed an antibiotic medicine, take it as told by your health care provider. Do not stop taking the antibiotic even if you start to feel better. Keep all follow-up visits. This is important. This information is not intended to replace advice given to you by your health care provider. Make sure you discuss any questions you have with your health care provider. Document Revised: 04/19/2021 Document Reviewed: 04/19/2021 Elsevier Patient Education  2024 [employer]   If you have been instructed to have an in-person evaluation today at a local Urgent Care facility, please use the link below. It will take you to a list of all of our available [facility], including address, phone number and hours of operation. Please do not delay care.  [facility]  If you or a family member do not have a primary care provider, use the link below to schedule a visit and establish care. When you choose a [facility] primary care physician or advanced practice provider, you  gain a long-term partner in health. Find a Primary Care Provider  Learn more about [facility]'s in-office and virtual care options: [place] - Get Care Now

## 2023-10-06 NOTE — Progress Notes (Signed)
 Virtual Visit Consent   [name], you are scheduled for a virtual visit with a [facility] provider today. Just as with appointments in the office, your consent must be obtained to participate. Your consent will be active for this visit and any virtual visit you may have with one of our providers in the next 365 days. If you have a MyChart account, a copy of this consent can be sent to you electronically.  As this is a virtual visit, video technology does not allow for your provider to perform a traditional examination. This may limit your provider's ability to fully assess your condition. If your provider identifies any concerns that need to be evaluated in person or the need to arrange testing (such as labs, EKG, etc.), we will make arrangements to do so. Although advances in technology are sophisticated, we cannot ensure that it will always work on either your end or our end. If the connection with a video visit is poor, the visit may have to be switched to a telephone visit. With either a video or telephone visit, we are not always able to ensure that we have a secure connection.  By engaging in this virtual visit, you consent to the provision of healthcare and authorize for your insurance to be billed (if applicable) for the services provided during this visit. Depending on your insurance coverage, you may receive a charge related to this service.  I need to obtain your verbal consent now. Are you willing to proceed with your visit today? Lucas Rocha has provided verbal consent on 10/06/2023 for a virtual visit (video or telephone). Lucas Rocha, [place]  Date: 10/06/2023 10:35 AM   Virtual Visit via Video Note   I, Lucas Rocha, connected with  Lucas Rocha  ([id number], 07-Sep-1965) on 10/06/23 at 10:30 AM EDT by a video-enabled telemedicine application and verified that I am speaking with the correct person using two identifiers.  Location: Patient: Virtual Visit Location  Patient: Home Provider: Virtual Visit Location Provider: Home Office   I discussed the limitations of evaluation and management by telemedicine and the availability of in person appointments. The patient expressed understanding and agreed to proceed.    History of Present Illness: Lucas Rocha is a 58 y.o. who identifies as a male who was assigned male at birth, and is being seen today for c/o ending up with pneumonia two weeks ago.  Pt states he felt he was getting better with that but now has had thick yellow mucus with cough.  Pt states feels like cough sometimes coughs up mucus and sometimes doesn't.  Pt states he was treated with antibiotics for the pneumonia.  Pt states he was not given anything for cough because he did not have a cough.  Pt states prescribed Levofloxacin  for 5 days and last dose was April 28 th.  Pt states current symptoms started three days ago.  Pt states he just has a lot of fatigue, nasal drainage and feeling stopped.  Pt states has sinus pressure and not feeling well.  Pt denies sick contacts and has not taken a covid or flu test. Pt denies fever, chills or headaches.   HPI: HPI  Problems:  Patient Active Problem List   Diagnosis Date Noted   nonobstructive CAD (coronary artery disease) 06/01/2021   Aortic atherosclerosis (HCC) 06/01/2021   Hyperlipidemia, unspecified 06/01/2021   Erectile dysfunction 09/17/2017   BPH (benign prostatic hyperplasia)    OSA (obstructive sleep apnea)  Ureterolithiasis 02/07/2013   Gastric polyp 12/12/2011   Diverticulosis 12/07/2011   Hx of adenomatous colonic polyps 12/07/2011   Iron deficiency anemia 10/24/2011   Allergic rhinitis 01/01/2007   G E R D 12/28/2006    Allergies:  Allergies  Allergen Reactions   Penicillins Hives     AND HYPERACTIVITY   Sulfa Antibiotics     Unknown childhood reaction   Medications:  Current Outpatient Medications:    benzonatate (TESSALON) 100 MG capsule, Take 1 capsule (100 mg  total) by mouth 3 (three) times daily as needed for up to 7 days., Disp: 21 capsule, Rfl: 0   fluticasone  (FLONASE ) 50 MCG/ACT nasal spray, Place 2 sprays into both nostrils daily., Disp: 16 g, Rfl: 0   guaiFENesin 200 MG tablet, Take 2 tablets (400 mg total) by mouth every 4 (four) hours as needed for up to 7 days for cough or to loosen phlegm., Disp: 30 tablet, Rfl: 0   predniSONE  (STERAPRED UNI-PAK 21 TAB) 10 MG (21) TBPK tablet, Take following package directions., Disp: 21 tablet, Rfl: 0   alfuzosin  (UROXATRAL ) 10 MG 24 hr tablet, TAKE 1 TABLET BY MOUTH DAILY  WITH BREAKFAST, Disp: 90 tablet, Rfl: 3   aspirin  EC 81 MG tablet, Take 1 tablet (81 mg total) by mouth daily. Swallow whole., Disp: 90 tablet, Rfl: 3   fexofenadine (ALLEGRA) 60 MG tablet, Take 60 mg by mouth as needed. , Disp: , Rfl:    levofloxacin  (LEVAQUIN ) 750 MG tablet, Take 1 tablet (750 mg total) by mouth daily., Disp: 5 tablet, Rfl: 0   Mometasone Furoate (ALLERGY NASAL SPRAY NA), Place into the nose daily as needed., Disp: , Rfl:    omeprazole  (PRILOSEC) 20 MG capsule, TAKE 1 CAPSULE BY MOUTH DAILY, Disp: 90 capsule, Rfl: 3   rosuvastatin  (CRESTOR ) 5 MG tablet, TAKE 1 TABLET BY MOUTH DAILY, Disp: 90 tablet, Rfl: 3   sildenafil  (VIAGRA ) 100 MG tablet, Take 1 tablet (100 mg total) by mouth daily as needed for erectile dysfunction., Disp: 10 tablet, Rfl: 11  Observations/Objective: Patient is well-developed, well-nourished in no acute distress.  Resting comfortably at home.  Head is normocephalic, atraumatic.  No labored breathing.  Speech is clear and coherent with logical content.  Patient is alert and oriented at baseline.    Assessment and Plan: 1. Acute sinusitis, recurrence not specified, unspecified location (Primary) - predniSONE  (STERAPRED UNI-PAK 21 TAB) 10 MG (21) TBPK tablet; Take following package directions.  Dispense: 21 tablet; Refill: 0 - fluticasone  (FLONASE ) 50 MCG/ACT nasal spray; Place 2 sprays into  both nostrils daily.  Dispense: 16 g; Refill: 0 - benzonatate (TESSALON) 100 MG capsule; Take 1 capsule (100 mg total) by mouth 3 (three) times daily as needed for up to 7 days.  Dispense: 21 capsule; Refill: 0 - guaiFENesin 200 MG tablet; Take 2 tablets (400 mg total) by mouth every 4 (four) hours as needed for up to 7 days for cough or to loosen phlegm.  Dispense: 30 tablet; Refill: 0  -Start Medications as noted above -Advised Pt if no improvement to follow up with PCP or urgent care  -Continue conservative measures   Follow Up Instructions: I discussed the assessment and treatment plan with the patient. The patient was provided an opportunity to ask questions and all were answered. The patient agreed with the plan and demonstrated an understanding of the instructions.  A copy of instructions were sent to the patient via MyChart unless otherwise noted below.  The patient was advised to call back or seek an in-person evaluation if the symptoms worsen or if the condition fails to improve as anticipated.    Lucas Roys, PA-C

## 2023-10-15 ENCOUNTER — Encounter: Payer: Self-pay | Admitting: Physician Assistant

## 2023-10-15 ENCOUNTER — Ambulatory Visit: Admitting: Physician Assistant

## 2023-10-15 VITALS — BP 124/76 | HR 117 | Temp 98.4°F | Ht 72.0 in | Wt 187.0 lb

## 2023-10-15 DIAGNOSIS — J0100 Acute maxillary sinusitis, unspecified: Secondary | ICD-10-CM

## 2023-10-15 MED ORDER — DOXYCYCLINE HYCLATE 100 MG PO TABS: 100.0000 mg | ORAL_TABLET | Freq: Two times a day (BID) | ORAL | 0 refills | Status: DC

## 2023-10-15 MED ORDER — AZELASTINE HCL 0.1 % NA SOLN: 1.0000 | Freq: Two times a day (BID) | NASAL | 1 refills | Status: DC

## 2023-10-15 NOTE — Progress Notes (Signed)
 Lucas Rocha is a 58 y.o. male here for a follow up of a pre-existing problem.  History of Present Illness:   Chief Complaint  Patient presents with   Cough    Pt c/o persistent cough and expectorating yellow/white sputum. Denies fever or chills. Completed antibiotic and round of Prednisone .    HPI - Pt is accompanied by his wife.  Cough Pt complains of a persistent and productive cough with yellow/white sputum. Reports significant mucus production, left sinus congestion/pain, and a popping noise in his ears. Pt feels like the sinus pressure is worse than the cough. Per pt's wife, his cough is "shrill" and his energy levels go up and down. Completed a course of levaquin  in April for pneumonia.  She also notes he has allergies. Endorses completion of a round of Prednisone , occasional Benzonatate , a nasal spray, regularly wearing a dust mask and gloves recently, and sometimes intakes caffeine (soft drink). Pt cannot recall the name of the nasal spray at this time, but denies that it is Afrin. Denies fever, chills, ear discharge, or throat pain. Denies taking any OTC medication since last visit. Not established with ENT  Past Medical History:  Diagnosis Date   Allergic rhinitis    Allergy Child   Penicillin sulfs drugs   BPH (benign prostatic hyperplasia)    Coronary artery calcification seen on CAT scan    05-03-2021  calcium  score = 54.9, all located LAD/ D1  (cardiologist-- dr Floria Hurst)   ED (erectile dysfunction)    GERD (gastroesophageal reflux disease)    Hiatal hernia    History of iron deficiency anemia    History of kidney stones    Hyperlipidemia    Lower urinary tract symptoms (LUTS)    OSA on CPAP    01-08-2023  per pt uses nightly   RBBB (right bundle branch block)    Renal calculi 08/2021   right side   Sleep apnea 2020     Social History   Tobacco Use   Smoking status: Never   Smokeless tobacco: Never  Vaping Use   Vaping status: Never Used   Substance Use Topics   Alcohol use: Yes    Comment: rare   Drug use: No    Past Surgical History:  Procedure Laterality Date   COLONOSCOPY WITH PROPOFOL   05/24/2018   by dr Bridgett Camps   CYSTOSCOPY WITH RETROGRADE PYELOGRAM, URETEROSCOPY AND STENT PLACEMENT Right 05/14/2013   Procedure: CYSTOSCOPY WITH RETROGRADE PYELOGRAM, URETEROSCOPY AND STENT PLACEMENT;  Surgeon: Osborn Blaze, MD;  Location: [facility];  Service: Urology;  Laterality: Right;   CYSTOSCOPY WITH RETROGRADE PYELOGRAM, URETEROSCOPY AND STENT PLACEMENT Right 09/20/2021   Procedure: CYSTOSCOPY WITH RIGHT RETROGRADE PYELOGRAM, URETEROSCOPY AND STENT PLACEMENT POSSIBLE HOLMIUM LASER LITHOTRIPSY;  Surgeon: Homero Luster, MD;  Location: [facility];  Service: Urology;  Laterality: Right;   CYSTOSCOPY/URETEROSCOPY/HOLMIUM LASER/STENT PLACEMENT Right 01/16/2023   Procedure: CYSTOSCOPY RIGHT RETROGRADE PYELOGRAMURETEROSCOPY/HOLMIUM LASER/STENT PLACEMENT;  Surgeon: Homero Luster, MD;  Location: [facility];  Service: Urology;  Laterality: Right;  1 HR FOR CASE   ESOPHAGOGASTRODUODENOSCOPY  11/28/2011   by dr pyrtle   EXTRACORPOREAL SHOCK WAVE LITHOTRIPSY Right 02/13/2013   @[facility]    FOOT GANGLION EXCISION Left 1999   HOLMIUM LASER APPLICATION Right 05/14/2013   Procedure: HOLMIUM LASER APPLICATION;  Surgeon: Osborn Blaze, MD;  Location: [facility];  Service: Urology;  Laterality: Right;    Family History  Problem Relation Age of Onset   Arthritis Mother  Cancer Father        unknown- not worked up 76   Diabetes Maternal Grandfather    Heart disease Other        Dad's side - grandfather, grandmother (big MI)   Diabetes Paternal Grandfather    Colon cancer Neg Hx    Colon polyps Neg Hx    Esophageal cancer Neg Hx    Stomach cancer Neg Hx    Rectal cancer Neg Hx     Allergies  Allergen Reactions   Penicillins Hives     AND HYPERACTIVITY   Sulfa Antibiotics      Unknown childhood reaction    Current Medications:   Current Outpatient Medications:    alfuzosin  (UROXATRAL ) 10 MG 24 hr tablet, TAKE 1 TABLET BY MOUTH DAILY  WITH BREAKFAST, Disp: 90 tablet, Rfl: 3   aspirin  EC 81 MG tablet, Take 1 tablet (81 mg total) by mouth daily. Swallow whole., Disp: 90 tablet, Rfl: 3   azelastine  (ASTELIN ) 0.1 % nasal spray, Place 1 spray into both nostrils 2 (two) times daily. Use in each nostril as directed, Disp: 30 mL, Rfl: 1   doxycycline  (VIBRA -TABS) 100 MG tablet, Take 1 tablet (100 mg total) by mouth 2 (two) times daily., Disp: 20 tablet, Rfl: 0   fexofenadine (ALLEGRA) 60 MG tablet, Take 60 mg by mouth as needed. , Disp: , Rfl:    fluticasone  (FLONASE ) 50 MCG/ACT nasal spray, Place 2 sprays into both nostrils daily., Disp: 16 g, Rfl: 0   Mometasone Furoate (ALLERGY NASAL SPRAY NA), Place into the nose daily as needed., Disp: , Rfl:    omeprazole  (PRILOSEC) 20 MG capsule, TAKE 1 CAPSULE BY MOUTH DAILY, Disp: 90 capsule, Rfl: 3   rosuvastatin  (CRESTOR ) 5 MG tablet, TAKE 1 TABLET BY MOUTH DAILY, Disp: 90 tablet, Rfl: 3   sildenafil  (VIAGRA ) 100 MG tablet, Take 1 tablet (100 mg total) by mouth daily as needed for erectile dysfunction., Disp: 10 tablet, Rfl: 11   Review of Systems:   Negative unless otherwise specified per HPI.  Vitals:   Vitals:   10/15/23 1525  BP: 124/76  Pulse: (!) 117  Temp: 98.4 F (36.9 C)  TempSrc: Temporal  SpO2: 95%  Weight: 187 lb (84.8 kg)  Height: 6' (1.829 m)     Body mass index is 25.36 kg/m.  Physical Exam:   Physical Exam Vitals and nursing note reviewed.  Constitutional:      General: He is not in acute distress.    Appearance: He is well-developed. He is not ill-appearing or toxic-appearing.  HENT:     Head: Normocephalic and atraumatic.     Right Ear: Tympanic membrane, ear canal and external ear normal. Tympanic membrane is not erythematous, retracted or bulging.     Left Ear: Tympanic membrane, ear  canal and external ear normal. Tympanic membrane is not erythematous, retracted or bulging.     Nose:     Right Sinus: No maxillary sinus tenderness or frontal sinus tenderness.     Left Sinus: Maxillary sinus tenderness present. No frontal sinus tenderness.     Mouth/Throat:     Pharynx: Uvula midline. No posterior oropharyngeal erythema.  Eyes:     General: Lids are normal.     Conjunctiva/sclera: Conjunctivae normal.  Neck:     Trachea: Trachea normal.  Cardiovascular:     Rate and Rhythm: Normal rate and regular rhythm.     Heart sounds: Normal heart sounds, S1 normal and S2 normal.  Pulmonary:  Effort: Pulmonary effort is normal.     Breath sounds: Normal breath sounds. No decreased breath sounds, wheezing, rhonchi or rales.  Lymphadenopathy:     Cervical: No cervical adenopathy.  Skin:    General: Skin is warm and dry.  Neurological:     Mental Status: He is alert.  Psychiatric:        Speech: Speech normal.        Behavior: Behavior normal. Behavior is cooperative.     Assessment and Plan:   1. Acute non-recurrent maxillary sinusitis (Primary) No red flags on exam.   Will initiate doxycycline  per orders.  Discussed taking medications as prescribed.  Start doxycycline  antibiotic(s) Start Nasal saline spray (i.e., Simply Saline) or nasal saline lavage (i.e., NeilMed) is recommended as needed and prior to medicated nasal sprays. Follow Saline Nasal spray with Astelin  nasal spray If no better, let us  know and we will advise on next steps -- consider CT scan of sinuses vs repeat CXR based on symptom(s)  Reviewed return precautions including new or worsening fever, SOB, new or worsening cough or other concerns.  Push fluids and rest.  I recommend that patient follow-up if symptoms worsen or persist despite treatment x 7-10 days, sooner if needed.    I, Timoteo Force, acting as a Neurosurgeon for [employer], [place]., have documented all relevant documentation on the behalf of  Alexander Iba, [place], as directed by  Alexander Iba, PA while in the presence of Alexander Iba, [place].  I, Alexander Iba, [place], have reviewed all documentation for this visit. The documentation on 10/15/23 for the exam, diagnosis, procedures, and orders are all accurate and complete.  Alexander Iba, PA-C

## 2023-10-15 NOTE — Patient Instructions (Signed)
 It was great to see you!  Start doxycycline  antibiotic(s)  Start Nasal saline spray (i.e., Simply Saline) or nasal saline lavage (i.e., NeilMed) is recommended as needed and prior to medicated nasal sprays.  Follow Saline Nasal spray with Astelin  nasal spray  If no better, let us  know and we will advise on next steps  Take care,  Alexander Iba PA-C

## 2023-10-16 ENCOUNTER — Emergency Department (HOSPITAL_BASED_OUTPATIENT_CLINIC_OR_DEPARTMENT_OTHER)
Admission: EM | Admit: 2023-10-16 | Discharge: 2023-10-16 | Disposition: A | Discharge status: Home / Self Care | Attending: Emergency Medicine | Admitting: Emergency Medicine

## 2023-10-16 ENCOUNTER — Emergency Department (HOSPITAL_BASED_OUTPATIENT_CLINIC_OR_DEPARTMENT_OTHER)

## 2023-10-16 ENCOUNTER — Other Ambulatory Visit: Payer: Self-pay

## 2023-10-16 ENCOUNTER — Encounter (HOSPITAL_BASED_OUTPATIENT_CLINIC_OR_DEPARTMENT_OTHER): Payer: Self-pay | Admitting: Emergency Medicine

## 2023-10-16 DIAGNOSIS — Z7982 Long term (current) use of aspirin: Secondary | ICD-10-CM | POA: Insufficient documentation

## 2023-10-16 DIAGNOSIS — R109 Unspecified abdominal pain: Secondary | ICD-10-CM | POA: Diagnosis present

## 2023-10-16 DIAGNOSIS — N201 Calculus of ureter: Secondary | ICD-10-CM | POA: Insufficient documentation

## 2023-10-16 LAB — URINALYSIS, ROUTINE W REFLEX MICROSCOPIC
Bilirubin Urine: NEGATIVE
Glucose, UA: NEGATIVE mg/dL
Ketones, ur: NEGATIVE mg/dL
Leukocytes,Ua: NEGATIVE
Nitrite: NEGATIVE
Protein, ur: 30 mg/dL — AB
Specific Gravity, Urine: >=1.03 (ref 1.005–1.030)
pH: 5.5 (ref 5.0–8.0)

## 2023-10-16 LAB — URINALYSIS, MICROSCOPIC (REFLEX)

## 2023-10-16 MED ORDER — ONDANSETRON HCL 4 MG/2ML IJ SOLN
4.0000 mg | Freq: Once | INTRAMUSCULAR | Status: AC
  Administered 2023-10-16: 4 mg via INTRAVENOUS
  Filled 2023-10-16: qty 2

## 2023-10-16 MED ORDER — KETOROLAC TROMETHAMINE 30 MG/ML IJ SOLN
15.0000 mg | Freq: Once | INTRAMUSCULAR | Status: AC
  Administered 2023-10-16: 15 mg via INTRAVENOUS
  Filled 2023-10-16: qty 1

## 2023-10-16 MED ORDER — HYDROMORPHONE HCL 1 MG/ML IJ SOLN
1.0000 mg | Freq: Once | INTRAMUSCULAR | Status: AC
  Administered 2023-10-16: 1 mg via INTRAVENOUS
  Filled 2023-10-16: qty 1

## 2023-10-16 NOTE — ED Provider Notes (Signed)
 [place] [facility] AT [facility]  Provider Note  CSN: [id number] Arrival date & time: 10/16/23 0127  History Chief Complaint  Patient presents with   Flank Pain    Lucas Rocha is a 58 y.o. male with history of renal stones seen on 4/10 for left flank pain and found to have a 2mm distal stone. He reports his pain from that visit had resolved, but he does not think he ever passed the stone. Tonight around 2200hrs his pain returned, severe L flank, non radiating. No hematuria, dysuria, fever.    Home Medications Prior to Admission medications   Medication Sig Start Date End Date Taking? Authorizing Provider  alfuzosin  (UROXATRAL ) 10 MG 24 hr tablet TAKE 1 TABLET BY MOUTH DAILY  WITH BREAKFAST 09/19/23   Almira Jaeger, MD  aspirin  EC 81 MG tablet Take 1 tablet (81 mg total) by mouth daily. Swallow whole. 04/18/21   Nahser, Lela Purple, MD  azelastine  (ASTELIN ) 0.1 % nasal spray Place 1 spray into both nostrils 2 (two) times daily. Use in each nostril as directed 10/15/23   Alexander Iba, PA  doxycycline  (VIBRA -TABS) 100 MG tablet Take 1 tablet (100 mg total) by mouth 2 (two) times daily. 10/15/23   Alexander Iba, PA  fexofenadine (ALLEGRA) 60 MG tablet Take 60 mg by mouth as needed.     [provider]  fluticasone  (FLONASE ) 50 MCG/ACT nasal spray Place 2 sprays into both nostrils daily. 10/06/23   Reyes, Shaylo, PA-C  Mometasone Furoate (ALLERGY NASAL SPRAY NA) Place into the nose daily as needed.    [provider]  omeprazole  (PRILOSEC) 20 MG capsule TAKE 1 CAPSULE BY MOUTH DAILY 03/16/23   Almira Jaeger, MD  rosuvastatin  (CRESTOR ) 5 MG tablet TAKE 1 TABLET BY MOUTH DAILY 03/26/23   Almira Jaeger, MD  sildenafil  (VIAGRA ) 100 MG tablet Take 1 tablet (100 mg total) by mouth daily as needed for erectile dysfunction. 02/16/22   Almira Jaeger, MD     Allergies    Penicillins and Sulfa antibiotics   Review of Systems   Review  of Systems Please see HPI for pertinent positives and negatives  Physical Exam BP 133/81   Pulse (!) 59   Temp 97.7 F (36.5 C) (Oral)   Resp 18   Ht 6' (1.829 m)   Wt 83.5 kg   SpO2 98%   BMI 24.95 kg/m   Physical Exam Vitals and nursing note reviewed.  Constitutional:      Appearance: Normal appearance.     Comments: Uncomfortable appearing  HENT:     Head: Normocephalic and atraumatic.     Nose: Nose normal.     Mouth/Throat:     Mouth: Mucous membranes are moist.  Eyes:     Extraocular Movements: Extraocular movements intact.     Conjunctiva/sclera: Conjunctivae normal.  Cardiovascular:     Rate and Rhythm: Normal rate.  Pulmonary:     Effort: Pulmonary effort is normal.     Breath sounds: Normal breath sounds.  Abdominal:     General: Abdomen is flat.     Palpations: Abdomen is soft.     Tenderness: There is no abdominal tenderness.  Musculoskeletal:        General: No swelling. Normal range of motion.     Cervical back: Neck supple.  Skin:    General: Skin is warm and dry.  Neurological:     General: No focal deficit present.  Mental Status: He is alert.  Psychiatric:        Mood and Affect: Mood normal.     ED Results / Procedures / Treatments   EKG None  Procedures Procedures  Medications Ordered in the ED Medications  HYDROmorphone  (DILAUDID ) injection 1 mg (1 mg Intravenous Given 10/16/23 0304)  ondansetron  (ZOFRAN ) injection 4 mg (4 mg Intravenous Given 10/16/23 0304)  ketorolac  (TORADOL ) 30 MG/ML injection 15 mg (15 mg Intravenous Given 10/16/23 0304)    Initial Impression and Plan  Patient here with L flank pain, consistent with renal colic. UA without signs of infection. I personally viewed the images from radiology studies and agree with radiologist interpretation: CT shows 2mm distal left ureteral stone in essentially same location as previous scan. Suspect this is the same stone. He did not have intrarenal stones noted at that visit.  Will give pain/nausea medications and reassess.   ED Course   Clinical Course as of 10/16/23 0351  Tue Oct 16, 2023  0350 Patient reports pain is improved. He is resting comfortably now. He has remaining meds from prior stone including flomax , percocet, toradol . Recommend he continue straining his urine and call his urologist in the AM for further management.  [CS]    Clinical Course User Index [CS] Charmayne Cooper, MD     MDM Rules/Calculators/A&P Medical Decision Making Problems Addressed: Ureterolithiasis: acute illness or injury  Amount and/or Complexity of Data Reviewed Labs: ordered. Decision-making details documented in ED Course. Radiology: ordered and independent interpretation performed. Decision-making details documented in ED Course.  Risk Prescription drug management. Parenteral controlled substances.     Final Clinical Impression(s) / ED Diagnoses Final diagnoses:  Ureterolithiasis    Rx / DC Orders ED Discharge Orders     None        Charmayne Cooper, MD 10/16/23 [phone]

## 2023-10-16 NOTE — ED Notes (Signed)
 Reviewed D/C information with the patient, pt verbalized understanding. No additional concerns at this time.

## 2023-10-16 NOTE — ED Triage Notes (Signed)
 Flank pain since 10pm. Hx of stones. Seen a month ago for same.

## 2023-10-19 ENCOUNTER — Other Ambulatory Visit: Payer: Self-pay | Admitting: Urology

## 2023-10-19 NOTE — Progress Notes (Signed)
 Anesthesia Review:  PCP: Cardiologist :  PPM/ ICD: Device Orders: Rep Notified:  Chest x-ray : EKG : Echo : Stress test: Cardiac Cath :   Activity level:  Sleep Study/ CPAP : Fasting Blood Sugar :      / Checks Blood Sugar -- times a day:    Blood Thinner/ Instructions /Last Dose: ASA / Instructions/ Last Dose :

## 2023-10-19 NOTE — Patient Instructions (Addendum)
 SURGICAL WAITING ROOM VISITATION  Patients having surgery or a procedure may have no more than 2 support people in the waiting area - these visitors may rotate.    Children under the age of 59 must have an adult with them who is not the patient.  Due to an increase in RSV and influenza rates and associated hospitalizations, children ages 29 and under may not visit patients in [facility] hospitals.  Visitors with respiratory illnesses are discouraged from visiting and should remain at home.  If the patient needs to stay at the hospital during part of their recovery, the visitor guidelines for inpatient rooms apply. Pre-op nurse will coordinate an appropriate time for 1 support person to accompany patient in pre-op.  This support person may not rotate.    Please refer to the [facility] website for the visitor guidelines for Inpatients (after your surgery is over and you are in a regular room).       Your procedure is scheduled on:  10/25/2023    Report to [facility] Main Entrance    Report to admitting at   1245 pm    Call this number if you have problems the morning of surgery [phone]   Do not eat food :After Midnight.   After Midnight you may have the following liquids until _ 1200 noon day of surgery _____   Water  Non-Citrus Juices (without pulp, NO RED-Apple, White grape, White cranberry) Black Coffee (NO MILK/CREAM OR CREAMERS, sugar ok)  Clear Tea (NO MILK/CREAM OR CREAMERS, sugar ok) regular and decaf                             Plain Jell-O (NO RED)                                           Fruit ices (not with fruit pulp, NO RED)                                     Popsicles (NO RED)                                                               Sports drinks like Gatorade (NO RED)                             If you have questions, please contact your surgeon's office.      Oral Hygiene is also important to reduce your risk of infection.                                     Remember - BRUSH YOUR TEETH THE MORNING OF SURGERY WITH YOUR REGULAR TOOTHPASTE  DENTURES WILL BE REMOVED PRIOR TO SURGERY PLEASE DO NOT APPLY "Poly grip" OR ADHESIVES!!!   Do NOT smoke after Midnight   Stop all vitamins and herbal supplements 7 days before surgery.   Take these medicines the  morning of surgery with A SIP OF WATER :   orixatral, flonase , omeprazole    DO NOT TAKE ANY ORAL DIABETIC MEDICATIONS DAY OF YOUR SURGERY  Bring CPAP mask and tubing day of surgery.                              You may not have any metal on your body including hair pins, jewelry, and body piercing             Do not wear make-up, lotions, powders, perfumes/cologne, or deodorant  Do not wear nail polish including gel and S&S, artificial/acrylic nails, or any other type of covering on natural nails including finger and toenails. If you have artificial nails, gel coating, etc. that needs to be removed by a nail salon please have this removed prior to surgery or surgery may need to be canceled/ delayed if the surgeon/ anesthesia feels like they are unable to be safely monitored.   Do not shave  48 hours prior to surgery.               Men may shave face and neck.   Do not bring valuables to the hospital. [facility] IS NOT             RESPONSIBLE   FOR VALUABLES.   Contacts, glasses, dentures or bridgework may not be worn into surgery.   Bring small overnight bag day of surgery.   DO NOT BRING YOUR HOME MEDICATIONS TO THE HOSPITAL. PHARMACY WILL DISPENSE MEDICATIONS LISTED ON YOUR MEDICATION LIST TO YOU DURING YOUR ADMISSION IN THE HOSPITAL!    Patients discharged on the day of surgery will not be allowed to drive home.  Someone NEEDS to stay with you for the first 24 hours after anesthesia.   Special Instructions: Bring a copy of your healthcare power of attorney and living will documents the day of surgery if you haven't scanned them before.              Please read over  the following fact sheets you were given: IF YOU HAVE QUESTIONS ABOUT YOUR PRE-OP INSTRUCTIONS PLEASE CALL [phone]   If you received a COVID test during your pre-op visit  it is requested that you wear a mask when out in public, stay away from anyone that may not be feeling well and notify your surgeon if you develop symptoms. If you test positive for Covid or have been in contact with anyone that has tested positive in the last 10 days please notify you surgeon.    [facility] - Preparing for Surgery Before surgery, you can play an important role.  Because skin is not sterile, your skin needs to be as free of germs as possible.  You can reduce the number of germs on your skin by washing with CHG (chlorahexidine gluconate) soap before surgery.  CHG is an antiseptic cleaner which kills germs and bonds with the skin to continue killing germs even after washing. Please DO NOT use if you have an allergy to CHG or antibacterial soaps.  If your skin becomes reddened/irritated stop using the CHG and inform your nurse when you arrive at Short Stay. Do not shave (including legs and underarms) for at least 48 hours prior to the first CHG shower.  You may shave your face/neck. Please follow these instructions carefully:  1.  Shower with CHG Soap the night before surgery and the  morning of Surgery.  2.  If you choose to wash your hair, wash your hair first as usual with your  normal  shampoo.  3.  After you shampoo, rinse your hair and body thoroughly to remove the  shampoo.                           4.  Use CHG as you would any other liquid soap.  You can apply chg directly  to the skin and wash                       Gently with a scrungie or clean washcloth.  5.  Apply the CHG Soap to your body ONLY FROM THE NECK DOWN.   Do not use on face/ open                           Wound or open sores. Avoid contact with eyes, ears mouth and genitals (private parts).                       Wash face,  Genitals  (private parts) with your normal soap.             6.  Wash thoroughly, paying special attention to the area where your surgery  will be performed.  7.  Thoroughly rinse your body with warm water  from the neck down.  8.  DO NOT shower/wash with your normal soap after using and rinsing off  the CHG Soap.                9.  Pat yourself dry with a clean towel.            10.  Wear clean pajamas.            11.  Place clean sheets on your bed the night of your first shower and do not  sleep with pets. Day of Surgery : Do not apply any lotions/deodorants the morning of surgery.  Please wear clean clothes to the hospital/surgery center.  FAILURE TO FOLLOW THESE INSTRUCTIONS MAY RESULT IN THE CANCELLATION OF YOUR SURGERY PATIENT SIGNATURE_________________________________  NURSE SIGNATURE__________________________________  ________________________________________________________________________

## 2023-10-24 ENCOUNTER — Other Ambulatory Visit: Payer: Self-pay

## 2023-10-24 ENCOUNTER — Encounter (HOSPITAL_COMMUNITY): Payer: Self-pay

## 2023-10-24 ENCOUNTER — Encounter (HOSPITAL_COMMUNITY)
Admission: RE | Admit: 2023-10-24 | Discharge: 2023-10-24 | Disposition: A | Discharge status: Home / Self Care | Source: Ambulatory Visit | Attending: Urology | Admitting: Urology

## 2023-10-24 DIAGNOSIS — Z01818 Encounter for other preprocedural examination: Secondary | ICD-10-CM

## 2023-10-24 HISTORY — DX: Pneumonia, unspecified organism: J18.9

## 2023-10-25 ENCOUNTER — Ambulatory Visit (HOSPITAL_COMMUNITY)
Admission: RE | Admit: 2023-10-25 | Discharge: 2023-10-25 | Disposition: A | Discharge status: Home / Self Care | Attending: Urology | Admitting: Urology

## 2023-10-25 ENCOUNTER — Ambulatory Visit (HOSPITAL_COMMUNITY): Admitting: Physician Assistant

## 2023-10-25 ENCOUNTER — Ambulatory Visit (HOSPITAL_COMMUNITY): Admitting: Anesthesiology

## 2023-10-25 ENCOUNTER — Ambulatory Visit (HOSPITAL_COMMUNITY)

## 2023-10-25 ENCOUNTER — Encounter (HOSPITAL_COMMUNITY): Payer: Self-pay | Admitting: Urology

## 2023-10-25 ENCOUNTER — Encounter (HOSPITAL_COMMUNITY)
Admission: RE | Disposition: A | Discharge status: Home / Self Care | Payer: Self-pay | Source: Home / Self Care | Attending: Urology

## 2023-10-25 DIAGNOSIS — G709 Myoneural disorder, unspecified: Secondary | ICD-10-CM | POA: Diagnosis not present

## 2023-10-25 DIAGNOSIS — K449 Diaphragmatic hernia without obstruction or gangrene: Secondary | ICD-10-CM | POA: Insufficient documentation

## 2023-10-25 DIAGNOSIS — I2510 Atherosclerotic heart disease of native coronary artery without angina pectoris: Secondary | ICD-10-CM | POA: Diagnosis not present

## 2023-10-25 DIAGNOSIS — G4733 Obstructive sleep apnea (adult) (pediatric): Secondary | ICD-10-CM | POA: Insufficient documentation

## 2023-10-25 DIAGNOSIS — Z01818 Encounter for other preprocedural examination: Secondary | ICD-10-CM

## 2023-10-25 DIAGNOSIS — Z8249 Family history of ischemic heart disease and other diseases of the circulatory system: Secondary | ICD-10-CM | POA: Insufficient documentation

## 2023-10-25 DIAGNOSIS — K219 Gastro-esophageal reflux disease without esophagitis: Secondary | ICD-10-CM | POA: Insufficient documentation

## 2023-10-25 DIAGNOSIS — N201 Calculus of ureter: Secondary | ICD-10-CM | POA: Diagnosis present

## 2023-10-25 HISTORY — PX: CYSTOSCOPY/URETEROSCOPY/HOLMIUM LASER/STENT PLACEMENT: SHX6546

## 2023-10-25 HISTORY — PX: CYSTOSCOPY/RETROGRADE/URETEROSCOPY/STONE EXTRACTION WITH BASKET: SHX5317

## 2023-10-25 SURGERY — CYSTOSCOPY/URETEROSCOPY/HOLMIUM LASER/STENT PLACEMENT
Anesthesia: General | Laterality: Left

## 2023-10-25 MED ORDER — MIDAZOLAM HCL 2 MG/2ML IJ SOLN
INTRAMUSCULAR | Status: AC
Start: 2023-10-25 — End: ?
  Filled 2023-10-25: qty 2

## 2023-10-25 MED ORDER — ONDANSETRON HCL 4 MG/2ML IJ SOLN
INTRAMUSCULAR | Status: DC | PRN
  Administered 2023-10-25: 4 mg via INTRAVENOUS

## 2023-10-25 MED ORDER — FENTANYL CITRATE (PF) 100 MCG/2ML IJ SOLN
INTRAMUSCULAR | Status: AC
  Filled 2023-10-25: qty 2

## 2023-10-25 MED ORDER — FENTANYL CITRATE (PF) 100 MCG/2ML IJ SOLN
INTRAMUSCULAR | Status: DC | PRN
  Administered 2023-10-25: 50 ug via INTRAVENOUS

## 2023-10-25 MED ORDER — LACTATED RINGERS IV SOLN: INTRAVENOUS | Status: DC

## 2023-10-25 MED ORDER — ACETAMINOPHEN 10 MG/ML IV SOLN: 1000.0000 mg | Freq: Once | INTRAVENOUS | Status: DC | PRN

## 2023-10-25 MED ORDER — DEXAMETHASONE SODIUM PHOSPHATE 10 MG/ML IJ SOLN
INTRAMUSCULAR | Status: DC | PRN
Start: 2023-10-25 — End: 2023-10-25
  Administered 2023-10-25: 10 mg via INTRAVENOUS

## 2023-10-25 MED ORDER — PHENAZOPYRIDINE HCL 200 MG PO TABS: 200.0000 mg | ORAL_TABLET | Freq: Three times a day (TID) | ORAL | 0 refills | Status: DC | PRN

## 2023-10-25 MED ORDER — MIDAZOLAM HCL 2 MG/2ML IJ SOLN
INTRAMUSCULAR | Status: DC | PRN
Start: 2023-10-25 — End: 2023-10-25
  Administered 2023-10-25: 2 mg via INTRAVENOUS

## 2023-10-25 MED ORDER — FENTANYL CITRATE PF 50 MCG/ML IJ SOSY: 25.0000 ug | PREFILLED_SYRINGE | INTRAMUSCULAR | Status: DC | PRN

## 2023-10-25 MED ORDER — CHLORHEXIDINE GLUCONATE 0.12 % MT SOLN: 15.0000 mL | Freq: Once | OROMUCOSAL | Status: DC

## 2023-10-25 MED ORDER — SODIUM CHLORIDE 0.9 % IR SOLN
Status: DC | PRN
Start: 2023-10-25 — End: 2023-10-25
  Administered 2023-10-25: 3000 mL via INTRAVESICAL

## 2023-10-25 MED ORDER — OXYCODONE HCL 5 MG PO TABS: 5.0000 mg | ORAL_TABLET | Freq: Once | ORAL | Status: DC | PRN

## 2023-10-25 MED ORDER — METHOCARBAMOL 750 MG PO TABS: 750.0000 mg | ORAL_TABLET | Freq: Four times a day (QID) | ORAL | 0 refills | Status: AC

## 2023-10-25 MED ORDER — OXYCODONE HCL 5 MG/5ML PO SOLN: 5.0000 mg | Freq: Once | ORAL | Status: DC | PRN

## 2023-10-25 MED ORDER — PROPOFOL 10 MG/ML IV BOLUS
INTRAVENOUS | Status: DC | PRN
Start: 2023-10-25 — End: 2023-10-25
  Administered 2023-10-25: 200 mg via INTRAVENOUS

## 2023-10-25 MED ORDER — TAMSULOSIN HCL 0.4 MG PO CAPS: 0.4000 mg | ORAL_CAPSULE | Freq: Every day | ORAL | 0 refills | Status: DC

## 2023-10-25 MED ORDER — LIDOCAINE HCL (PF) 2 % IJ SOLN
INTRAMUSCULAR | Status: AC
  Filled 2023-10-25: qty 5

## 2023-10-25 MED ORDER — LIDOCAINE HCL (CARDIAC) PF 100 MG/5ML IV SOSY
PREFILLED_SYRINGE | INTRAVENOUS | Status: DC | PRN
  Administered 2023-10-25: 100 mg via INTRAVENOUS

## 2023-10-25 MED ORDER — ORAL CARE MOUTH RINSE: 15.0000 mL | Freq: Once | OROMUCOSAL | Status: DC

## 2023-10-25 MED ORDER — ONDANSETRON HCL 4 MG/2ML IJ SOLN
INTRAMUSCULAR | Status: AC
  Filled 2023-10-25: qty 2

## 2023-10-25 MED ORDER — HYOSCYAMINE SULFATE 0.125 MG PO TBDP: 0.1250 mg | ORAL_TABLET | Freq: Four times a day (QID) | ORAL | 0 refills | Status: DC | PRN

## 2023-10-25 MED ORDER — CIPROFLOXACIN IN D5W 400 MG/200ML IV SOLN
400.0000 mg | INTRAVENOUS | Status: AC
  Administered 2023-10-25: 400 mg via INTRAVENOUS
  Filled 2023-10-25: qty 200

## 2023-10-25 MED ORDER — PROPOFOL 10 MG/ML IV BOLUS
INTRAVENOUS | Status: AC
  Filled 2023-10-25: qty 20

## 2023-10-25 MED ORDER — DEXAMETHASONE SODIUM PHOSPHATE 10 MG/ML IJ SOLN
INTRAMUSCULAR | Status: AC
Start: 2023-10-25 — End: ?
  Filled 2023-10-25: qty 1

## 2023-10-25 MED ORDER — AMISULPRIDE (ANTIEMETIC) 5 MG/2ML IV SOLN: 10.0000 mg | Freq: Once | INTRAVENOUS | Status: DC | PRN

## 2023-10-25 MED ORDER — IOHEXOL 300 MG/ML  SOLN
INTRAMUSCULAR | Status: DC | PRN
Start: 2023-10-25 — End: 2023-10-25
  Administered 2023-10-25: 10 mL

## 2023-10-25 SURGICAL SUPPLY — 22 items
BAG URO CATCHER STRL LF (MISCELLANEOUS) ×1 IMPLANT
BASKET STONE NCOMPASS (UROLOGICAL SUPPLIES) IMPLANT
BASKET ZERO TIP NITINOL 2.4FR (BASKET) IMPLANT
CATH URETL OPEN 5X70 (CATHETERS) ×1 IMPLANT
CLOTH BEACON ORANGE TIMEOUT ST (SAFETY) ×1 IMPLANT
EXTRACTOR STONE 1.7FRX115CM (UROLOGICAL SUPPLIES) IMPLANT
FIBER LASER MOSES 200 DFL (Laser) IMPLANT
FIBER LASER MOSES 365 DFL (Laser) IMPLANT
GLOVE BIO SURGEON STRL SZ8 (GLOVE) ×1 IMPLANT
GOWN STRL REUS W/ TWL XL LVL3 (GOWN DISPOSABLE) ×1 IMPLANT
GUIDEWIRE ANG ZIPWIRE 038X150 (WIRE) IMPLANT
GUIDEWIRE STR DUAL SENSOR (WIRE) ×1 IMPLANT
KIT TURNOVER KIT A (KITS) IMPLANT
MANIFOLD NEPTUNE II (INSTRUMENTS) ×1 IMPLANT
PACK CYSTO (CUSTOM PROCEDURE TRAY) ×1 IMPLANT
SHEATH NAVIGATOR HD 11/13X36 (SHEATH) ×1 IMPLANT
SHEATH NAVIGATOR HD 12/14X28 (SHEATH) IMPLANT
SHEATH NAVIGATOR HD 12/14X36 (SHEATH) IMPLANT
STENT URET 6FRX26 CONTOUR (STENTS) IMPLANT
TRACTIP FLEXIVA PULSE ID 200 (Laser) IMPLANT
TUBING CONNECTING 10 (TUBING) ×1 IMPLANT
TUBING UROLOGY SET (TUBING) ×1 IMPLANT

## 2023-10-25 NOTE — Discharge Instructions (Addendum)
 DISCHARGE INSTRUCTIONS FOR Ureteroscopy and/or Ureteral Stent Placement  MEDICATIONS:  1.  Robaxin 2. Tamsulosin   3. Hyoscyamine   4. Methocarbamol   ACTIVITY:  1. No strenuous activity x 1week  2. No driving while on narcotic pain medications  3. Drink plenty of water   4. Continue to walk at home - it is normal to see blood in the urine while the stent is in place, so keep active, but don't over do it.  5. May return to work/school tomorrow or when you feel ready  6. You may experience some pain when urinating in the kidney on the side that was operated on while the stent is in place this is normal  WHAT IS NORMAL TO EXPERIENCE: It is normal to feel the urge to urinate while the stent is in place It is normal to have blood in your urine while the stent is in place  It sometimes can be normal to have pain in your kidney when you urinate   BATHING:  1. You can shower and we recommend daily showers  2. You have a string coming from your urethra: The stent string is attached to your ureteral stent. Do not pull on this until instructed.   DIET: You may return to your normal diet immediately. Because of the raw surface of your bladder, alcohol, spicy foods, acid type foods and drinks with caffeine may cause irritation or frequency and should be used in moderation. To keep your urine flowing freely and to avoid constipation, drink plenty of fluids during the day ( 8-10 glasses ). Tip: Avoid cranberry juice because it is very acidic.  SIGNS/SYMPTOMS TO CALL:  Please call us  if you have a fever greater than 101.5, uncontrolled nausea/vomiting, uncontrolled pain, dizziness, unable to urinate, bloody urine with clots greater than the size of a quarter, chest pain, shortness of breath, leg swelling, leg pain, redness around wound, drainage from wound, or any other concerns or questions.   You can reach us  at [phone].   FOLLOW-UP:  1. You may remove your stent in on Monday June 2nd. To do  this go into the shower, grab hold of the tether coming from your urethra. Pull the tether consistent motion until the stent is removed from your body. The stent will be around 10 inches long with a curl on either end.

## 2023-10-25 NOTE — Anesthesia Preprocedure Evaluation (Signed)
 Anesthesia Evaluation  Patient identified by MRN, date of birth, ID band Patient awake    Reviewed: Allergy & Precautions, NPO status , Patient's Chart, lab work & pertinent test results  Airway Mallampati: II  TM Distance: >3 FB Neck ROM: Full    Dental no notable dental hx.    Pulmonary sleep apnea and Continuous Positive Airway Pressure Ventilation    Pulmonary exam normal        Cardiovascular + CAD  Normal cardiovascular exam     Neuro/Psych  Neuromuscular disease  negative psych ROS   GI/Hepatic Neg liver ROS, hiatal hernia,GERD  Medicated and Controlled,,  Endo/Other  negative endocrine ROS    Renal/GU Renal disease     Musculoskeletal negative musculoskeletal ROS (+)    Abdominal   Peds  Hematology negative hematology ROS (+)   Anesthesia Other Findings LEFT URETERAL CALCULI  Reproductive/Obstetrics                             Anesthesia Physical Anesthesia Plan  ASA: 2  Anesthesia Plan: General   Post-op Pain Management:    Induction: Intravenous  PONV Risk Score and Plan: 2 and Ondansetron , Dexamethasone , Midazolam  and Treatment may vary due to age or medical condition  Airway Management Planned: LMA  Additional Equipment:   Intra-op Plan:   Post-operative Plan: Extubation in OR  Informed Consent: I have reviewed the patients History and Physical, chart, labs and discussed the procedure including the risks, benefits and alternatives for the proposed anesthesia with the patient or authorized representative who has indicated his/her understanding and acceptance.     Dental advisory given  Plan Discussed with: CRNA  Anesthesia Plan Comments:        Anesthesia Quick Evaluation

## 2023-10-25 NOTE — Transfer of Care (Signed)
 Immediate Anesthesia Transfer of Care Note  Patient: Lucas Rocha  Procedure(s) Performed: CYSTOSCOPY/URETEROSCOPY/HOLMIUM LASER/STENT PLACEMENT (Left) CYSTOSCOPY, WITH CALCULUS REMOVAL USING BASKET (Left)  Patient Location: PACU  Anesthesia Type:General  Level of Consciousness: sedated  Airway & Oxygen Therapy: Patient Spontanous Breathing and Patient connected to face mask oxygen  Post-op Assessment: Report given to RN and Post -op Vital signs reviewed and stable  Post vital signs: Reviewed and stable  Last Vitals:  Vitals Value Taken Time  BP    Temp    Pulse 61 10/25/23 1520  Resp 13 10/25/23 1520  SpO2 100 % 10/25/23 1520  Vitals shown include unfiled device data.  Last Pain:  Vitals:   10/25/23 1307  TempSrc:   PainSc: 0-No pain         Complications: No notable events documented.

## 2023-10-25 NOTE — Anesthesia Procedure Notes (Signed)
 Procedure Name: LMA Insertion Date/Time: 10/25/2023 2:37 PM  Performed by: Micky Albee, CRNAPre-anesthesia Checklist: Patient identified, Emergency Drugs available, Suction available, Patient being monitored and Timeout performed Patient Re-evaluated:Patient Re-evaluated prior to induction Preoxygenation: Pre-oxygenation with 100% oxygen Induction Type: IV induction LMA: LMA inserted LMA Size: 5.0 Tube type: Oral Number of attempts: 2 Placement Confirmation: positive ETCO2 and breath sounds checked- equal and bilateral Tube secured with: Tape Dental Injury: Teeth and Oropharynx as per pre-operative assessment

## 2023-10-25 NOTE — Op Note (Signed)
 Preoperative diagnosis: left ureteral calculus  Postoperative diagnosis: left ureteral calculus  Procedure:  Cystoscopy left ureteroscopy and stone removal Ureteroscopic laser lithotripsy left 81F x 26 ureteral stent placement w/ strings  left retrograde pyelography with interpretation  Surgeon: Dr. Aimee Alf  Anesthesia: General  Complications: None  Intraoperative findings:  Initial narrowing of the distal ureter able to pass with the scope Distal left ureteral stone dusted all fragments removed into the bladder 6 x 26 double-J stent with strings placed in the left ureter under fluoroscopy  Left retrograde pyelogram interpretation: Patent ureter, no signs of extravasation no ureteral narrowing, good contrast drainage  EBL: Minimal  Specimens: None  Disposition of specimens: [facility] for stone analysis  Indication: Lucas Rocha is a 58 y.o.   patient with a  left ureteral stone and has attempted trial of passage but is failed.  After reviewing the management options for treatment, the patient elected to proceed with the above surgical procedure(s). We have discussed the potential benefits and risks of the procedure, side effects of the proposed treatment, the likelihood of the patient achieving the goals of the procedure, and any potential problems that might occur during the procedure or recuperation. Informed consent has been obtained.   Description of procedure:  The patient was taken to the operating room and general anesthesia was induced.  The patient was placed in the dorsal lithotomy position, prepped and draped in the usual sterile fashion, and preoperative antibiotics were administered. A preoperative time-out was performed.   Cystourethroscopy was performed.  The patient's urethra was examined and was normal.  Bladder demonstrated high bladder neck with mild bilobar hypertrophy. Pan cystoscopy was performed and the bladder  systematically examined in its entirety. There was no evidence for any bladder tumors, stones, or other mucosal pathology.    Attention then turned to the left ureteral orifice  A 0.38 sensor guidewire was then advanced up the left ureter into the renal pelvis under fluoroscopic guidance. The 4.5 Fr semirigid ureteroscope was then advanced into the ureter next to the guidewire and the calculus was identified.   The stone was then fragmented with the 200 micron holmium laser fiber on a setting of 0.5 and frequency of 5 Hz.   All stones were then removed from the ureter with an N-gage nitinol basket.  Reinspection of the ureter revealed no remaining visible stones or fragments.   Retrograde pyelogram was performed and scope demonstrated findings as noted above.  The semirigid ureteroscope was removed.  The 20 French cystoscope was then reintroduced and bladder bladder was drained.  Leaving the wire in place the entirety of this procedure.  A 6 x 26 double-J stent with strings was then placed over the wire into the left ureter under fluoroscopic guidance.  Proper placement flow in the kidney as well as the bladder.  Patient is in extubated proceed to the PACU in stable addition.  Disposition: The tether of the stent was left on and secured to the ventral aspect of the patient's penis.  Instructions for removing the stent have been provided to the patient. The patient has been scheduled for followup in 6 weeks with a renal ultrasound.

## 2023-10-25 NOTE — H&P (Signed)
 H&P  Chief Complaint: Distal left ureteral stone  History of Present Illness: 58 year old male who initially presented with a small distal left ureteral stone 2 mm in size.  Patient attempted trial of passage and symptoms resolved then started develop symptomatic pain when repeat CT scan confirmed that the stone did not pass.  He is here today for definitive management of that stone.  Past Medical History:  Diagnosis Date   Allergic rhinitis    Allergy Child   Penicillin sulfs drugs   BPH (benign prostatic hyperplasia)    Coronary artery calcification seen on CAT scan    05-03-2021  calcium  score = 54.9, all located LAD/ D1  (cardiologist-- dr Floria Hurst)   ED (erectile dysfunction)    GERD (gastroesophageal reflux disease)    Hiatal hernia    History of iron deficiency anemia    History of kidney stones    Hyperlipidemia    Lower urinary tract symptoms (LUTS)    OSA on CPAP    01-08-2023  per pt uses nightly   Pneumonia    RBBB (right bundle branch block)    Renal calculi 08/2021   right side   Sleep apnea 2020   Past Surgical History:  Procedure Laterality Date   COLONOSCOPY WITH PROPOFOL   05/24/2018   by dr Bridgett Camps   CYSTOSCOPY WITH RETROGRADE PYELOGRAM, URETEROSCOPY AND STENT PLACEMENT Right 05/14/2013   Procedure: CYSTOSCOPY WITH RETROGRADE PYELOGRAM, URETEROSCOPY AND STENT PLACEMENT;  Surgeon: Osborn Blaze, MD;  Location: [facility];  Service: Urology;  Laterality: Right;   CYSTOSCOPY WITH RETROGRADE PYELOGRAM, URETEROSCOPY AND STENT PLACEMENT Right 09/20/2021   Procedure: CYSTOSCOPY WITH RIGHT RETROGRADE PYELOGRAM, URETEROSCOPY AND STENT PLACEMENT POSSIBLE HOLMIUM LASER LITHOTRIPSY;  Surgeon: Homero Luster, MD;  Location: [facility];  Service: Urology;  Laterality: Right;   CYSTOSCOPY/URETEROSCOPY/HOLMIUM LASER/STENT PLACEMENT Right 01/16/2023   Procedure: CYSTOSCOPY RIGHT RETROGRADE PYELOGRAMURETEROSCOPY/HOLMIUM LASER/STENT PLACEMENT;  Surgeon:  Homero Luster, MD;  Location: [facility];  Service: Urology;  Laterality: Right;  1 HR FOR CASE   ESOPHAGOGASTRODUODENOSCOPY  11/28/2011   by dr pyrtle   EXTRACORPOREAL SHOCK WAVE LITHOTRIPSY Right 02/13/2013   @[facility]    FOOT GANGLION EXCISION Left 1999   HOLMIUM LASER APPLICATION Right 05/14/2013   Procedure: HOLMIUM LASER APPLICATION;  Surgeon: Osborn Blaze, MD;  Location: [facility];  Service: Urology;  Laterality: Right;    Home Medications:  No medications prior to admission.   Allergies:  Allergies  Allergen Reactions   Penicillins Hives     AND HYPERACTIVITY   Sulfa Antibiotics     Unknown childhood reaction    Family History  Problem Relation Age of Onset   Arthritis Mother    Cancer Father        unknown- not worked up 76   Diabetes Maternal Grandfather    Heart disease Other        Dad's side - grandfather, grandmother (big MI)   Diabetes Paternal Grandfather    Colon cancer Neg Hx    Colon polyps Neg Hx    Esophageal cancer Neg Hx    Stomach cancer Neg Hx    Rectal cancer Neg Hx    Social History:  reports that he has never smoked. He has never used smokeless tobacco. He reports current alcohol use. He reports that he does not use drugs.  ROS: A complete review of systems was performed.  All systems are negative except for pertinent findings as noted. ROS   Physical Exam:  Vital signs in last 24 hours:   General:  Alert and oriented, No acute distress Respiratory: No work of breathing room air Cardiovascular: regular rate and rhythm per monitor  Laboratory Data:  No results found for this or any previous visit (from the past 24 hours). No results found for this or any previous visit (from the past 240 hours). Creatinine: No results for input(s): "CREATININE" in the last 168 hours.  Impression/Assessment:  58 year old male who initially presented with a small distal left ureteral stone 2 mm in size.  Patient attempted  trial of passage and symptoms resolved then started develop symptomatic pain when repeat CT scan confirmed that the stone did not pass.  He is here today for definitive management of that stone.  Urine culture prior to procedure was negative.  .We discussed risk benefits alternatives to ureteroscopy.  This included bleeding infection and damage to surrounding structures surrounding structures including ureter as well as urethra.  We discussed the need for stent postoperatively as well as the potential symptoms of stent placement.  We discussed possible inability to complete procedure due to caliber of your ureter or inability to pass stone possibly requiring long-term stent versus nephrostomy tube.  We discussed need for possible second surgery.  Patient voiced their understanding and consent was obtained.   Plan:  Plan for left uteroscopy with laser lithotripsy possible stent placement  Thelbert Finner 10/25/2023, 6:58 AM

## 2023-10-26 ENCOUNTER — Encounter (HOSPITAL_COMMUNITY): Payer: Self-pay | Admitting: Urology

## 2023-10-26 NOTE — Anesthesia Postprocedure Evaluation (Signed)
 Anesthesia Post Note  Patient: Lucas Rocha  Procedure(s) Performed: CYSTOSCOPY/URETEROSCOPY/HOLMIUM LASER/STENT PLACEMENT (Left) CYSTOSCOPY, WITH CALCULUS REMOVAL USING BASKET (Left)     Patient location during evaluation: PACU Anesthesia Type: General Level of consciousness: awake and alert Pain management: pain level controlled Vital Signs Assessment: post-procedure vital signs reviewed and stable Respiratory status: spontaneous breathing, nonlabored ventilation, respiratory function stable and patient connected to nasal cannula oxygen Cardiovascular status: blood pressure returned to baseline and stable Postop Assessment: no apparent nausea or vomiting Anesthetic complications: no   No notable events documented.  Last Vitals:  Vitals:   10/25/23 1545 10/25/23 1554  BP: (!) 135/93 (!) 136/90  Pulse: 82 82  Resp: 14 16  Temp:    SpO2: 98% 98%    Last Pain:  Vitals:   10/25/23 1554  TempSrc:   PainSc: 0-No pain                 Willian Harrow

## 2023-11-02 ENCOUNTER — Ambulatory Visit: Admitting: Family Medicine

## 2023-11-12 ENCOUNTER — Ambulatory Visit (INDEPENDENT_AMBULATORY_CARE_PROVIDER_SITE_OTHER)

## 2023-11-12 DIAGNOSIS — J189 Pneumonia, unspecified organism: Secondary | ICD-10-CM | POA: Diagnosis not present

## 2023-11-13 ENCOUNTER — Ambulatory Visit: Payer: Self-pay | Admitting: Family Medicine

## 2023-11-16 ENCOUNTER — Encounter: Payer: Self-pay | Admitting: Family Medicine

## 2023-11-16 ENCOUNTER — Ambulatory Visit: Admitting: Family Medicine

## 2023-11-16 VITALS — BP 120/80 | HR 83 | Temp 98.2°F | Ht 72.0 in | Wt 189.0 lb

## 2023-11-16 DIAGNOSIS — I2583 Coronary atherosclerosis due to lipid rich plaque: Secondary | ICD-10-CM

## 2023-11-16 DIAGNOSIS — E785 Hyperlipidemia, unspecified: Secondary | ICD-10-CM

## 2023-11-16 DIAGNOSIS — Z8701 Personal history of pneumonia (recurrent): Secondary | ICD-10-CM

## 2023-11-16 DIAGNOSIS — I2510 Atherosclerotic heart disease of native coronary artery without angina pectoris: Secondary | ICD-10-CM

## 2023-11-16 DIAGNOSIS — I700 Atherosclerosis of aorta: Secondary | ICD-10-CM | POA: Diagnosis not present

## 2023-11-16 MED ORDER — FAMOTIDINE 20 MG PO TABS: 20.0000 mg | ORAL_TABLET | Freq: Two times a day (BID) | ORAL | 3 refills | Status: DC

## 2023-11-16 NOTE — Patient Instructions (Addendum)
 plans to add 2000 units per day of vitamin D    Consider B12 1000 mcg at least once a week but could take daily  Trial famotidine 20 mg twice daily instead of omeprazole   Recommended follow up: Return for next already scheduled visit or sooner if needed.

## 2023-11-16 NOTE — Progress Notes (Signed)
 Phone [phone] In person visit   Subjective:   Lucas Rocha is a 58 y.o. year old very pleasant male patient who presents for/with See problem oriented charting Chief Complaint  Patient presents with   Follow-up    Discuss chest x-ray   Past Medical History-  Patient Active Problem List   Diagnosis Date Noted   nonobstructive CAD (coronary artery disease) 06/01/2021    Priority: High   Aortic atherosclerosis (HCC) 06/01/2021    Priority: Medium    Hyperlipidemia, unspecified 06/01/2021    Priority: Medium    BPH (benign prostatic hyperplasia)     Priority: Medium    G E R D 12/28/2006    Priority: Medium    Erectile dysfunction 09/17/2017    Priority: Low   OSA (obstructive sleep apnea)     Priority: Low   Ureterolithiasis 02/07/2013    Priority: Low   Gastric polyp 12/12/2011    Priority: Low   Diverticulosis 12/07/2011    Priority: Low   Hx of adenomatous colonic polyps 12/07/2011    Priority: Low   Iron deficiency anemia 10/24/2011    Priority: Low   Allergic rhinitis 01/01/2007    Priority: Low    Medications- reviewed and updated Current Outpatient Medications  Medication Sig Dispense Refill   alfuzosin  (UROXATRAL ) 10 MG 24 hr tablet TAKE 1 TABLET BY MOUTH DAILY  WITH BREAKFAST 90 tablet 3   aspirin  EC 81 MG tablet Take 1 tablet (81 mg total) by mouth daily. Swallow whole. 90 tablet 3   famotidine (PEPCID) 20 MG tablet Take 1 tablet (20 mg total) by mouth 2 (two) times daily. 180 tablet 3   fexofenadine (ALLEGRA) 60 MG tablet Take 60 mg by mouth as needed.      rosuvastatin  (CRESTOR ) 5 MG tablet TAKE 1 TABLET BY MOUTH DAILY 90 tablet 3   sildenafil  (VIAGRA ) 100 MG tablet Take 1 tablet (100 mg total) by mouth daily as needed for erectile dysfunction. 10 tablet 11   fluticasone  (FLONASE ) 50 MCG/ACT nasal spray Place 2 sprays into both nostrils daily. 16 g 0   No current facility-administered medications for this visit.     Objective:  BP 120/80    Pulse 83   Temp 98.2 F (36.8 C) (Temporal)   Ht 6' (1.829 m)   Wt 189 lb (85.7 kg)   SpO2 97%   BMI 25.63 kg/m  Gen: NAD, resting comfortably CV: RRR no murmurs rubs or gallops Lungs: CTAB no crackles, wheeze, rhonchi Abdomen: soft/nontender/nondistended/normal bowel sounds. No rebound or guarding.  Ext: no edema Skin: warm, dry     Assessment and Plan   # History of pneumonia S: Patient with chest x-ray 09/20/2023 showing hazy airspace opacities in the right mid and lower lung zone concerning for infectious process.  He ended up having a CT angiogram with elevated D-dimer but thankfully no pulmonary embolism-CT angiogram confirmed pneumonia but they recommended 6 to 12-week recheck for resolution  He completed full 10 days of doxycyline. He had some lingering cough and congestion but over last week that has fully cleared A/P: pneumonia fully cleared on x-ray and clinically- no further treatment needed  -offered Prevnar 20 today but wants to hold off   #Nonobstructive CAD-has seen Dr. Sherill Ding 07/19/2021  #hyperlipidemia #Aortic atherosclerosis S: Medication:Rosuvastatin  5 mg daily, aspirin  81 mg, has nitroglycerin  available if needed- no chest pain. Prior shortness of breath with pneumonia but now resolved -In 2022-coronary artery calcium  score was 187 which was 90th  percentile Lab Results  Component Value Date   CHOL 134 02/20/2023   HDL 57.70 02/20/2023   LDLCALC 63 02/20/2023   TRIG 69.0 02/20/2023   CHOLHDL 2 02/20/2023   A/P: Nonobstructive CAD remain asymptomatic-continue current medication Hyperlipidemia-lipids at goal-continue current medication-recheck at physical Aortic atherosclerosis (presumed stable)- LDL goal ideally <70-at goal-continue current medicine  # Relative B12 deficiency-reviewed most recent labs and encouraged at least weekly B12 if not daily-can recheck at physical Lab Results  Component Value Date   VITAMINB12 360 09/20/2023   #Vitamin D   deficiency S: Medication: multivitamin only  Last vitamin D  Lab Results  Component Value Date   VD25OH 23.76 (L) 09/20/2023  A/P: slightly low- plans to add 2000 units per day of vitamin D -likely recheck at physical to make sure trending in the right direction   # GERD with hiatal hernia S:Medication: Omeprazole  20 mg- reflux better lately. Trying to reduce added sugars A/P: Good control but worried about memory effects of omeprazole  potentially-we opted toTrial famotidine 20 mg twice daily instead of omeprazole    #memory loss- about 3 years of issues. Forgets small details. Minicog normal 2022 and 2023. B12 slightly low at times, TSH normal. Declined rpr/HIV. Has declined MRI brain in the past.  We are going to try to adjust omeprazole  as above  Recommended follow up: Return for next already scheduled visit or sooner if needed. Future Appointments  Date Time Provider Department Center  01/02/2024  9:00 AM Almira Jaeger, MD [facility]    Lab/Order associations:   ICD-10-CM   1. History of pneumonia  Z87.01     2. Coronary artery disease due to lipid rich plaque  I25.10    I25.83     3. Hyperlipidemia, unspecified hyperlipidemia type  E78.5     4. Aortic atherosclerosis (HCC)  I70.0       Meds ordered this encounter  Medications   famotidine (PEPCID) 20 MG tablet    Sig: Take 1 tablet (20 mg total) by mouth 2 (two) times daily.    Dispense:  180 tablet    Refill:  3    Return precautions advised.  Clarisa Crooked, MD

## 2024-01-02 ENCOUNTER — Ambulatory Visit (INDEPENDENT_AMBULATORY_CARE_PROVIDER_SITE_OTHER): Payer: 59 | Admitting: Family Medicine

## 2024-01-02 ENCOUNTER — Encounter: Payer: Self-pay | Admitting: Family Medicine

## 2024-01-02 VITALS — BP 118/68 | HR 92 | Temp 97.9°F | Ht 72.0 in | Wt 186.8 lb

## 2024-01-02 DIAGNOSIS — Z125 Encounter for screening for malignant neoplasm of prostate: Secondary | ICD-10-CM

## 2024-01-02 DIAGNOSIS — E559 Vitamin D deficiency, unspecified: Secondary | ICD-10-CM

## 2024-01-02 DIAGNOSIS — I2583 Coronary atherosclerosis due to lipid rich plaque: Secondary | ICD-10-CM | POA: Diagnosis not present

## 2024-01-02 DIAGNOSIS — I2510 Atherosclerotic heart disease of native coronary artery without angina pectoris: Secondary | ICD-10-CM | POA: Diagnosis not present

## 2024-01-02 DIAGNOSIS — Z0000 Encounter for general adult medical examination without abnormal findings: Secondary | ICD-10-CM

## 2024-01-02 DIAGNOSIS — Z23 Encounter for immunization: Secondary | ICD-10-CM | POA: Diagnosis not present

## 2024-01-02 DIAGNOSIS — Z131 Encounter for screening for diabetes mellitus: Secondary | ICD-10-CM

## 2024-01-02 DIAGNOSIS — E785 Hyperlipidemia, unspecified: Secondary | ICD-10-CM | POA: Diagnosis not present

## 2024-01-02 DIAGNOSIS — E663 Overweight: Secondary | ICD-10-CM

## 2024-01-02 LAB — COMPREHENSIVE METABOLIC PANEL WITH GFR
ALT: 24 U/L (ref 0–53)
AST: 21 U/L (ref 0–37)
Albumin: 4.4 g/dL (ref 3.5–5.2)
Alkaline Phosphatase: 85 U/L (ref 39–117)
BUN: 12 mg/dL (ref 6–23)
CO2: 31 meq/L (ref 19–32)
Calcium: 10 mg/dL (ref 8.4–10.5)
Chloride: 102 meq/L (ref 96–112)
Creatinine, Ser: 0.95 mg/dL (ref 0.40–1.50)
GFR: 89.72 mL/min (ref >60.00)
Glucose, Bld: 95 mg/dL (ref 70–99)
Potassium: 4 meq/L (ref 3.5–5.1)
Sodium: 139 meq/L (ref 135–145)
Total Bilirubin: 0.7 mg/dL (ref 0.2–1.2)
Total Protein: 7.1 g/dL (ref 6.0–8.3)

## 2024-01-02 LAB — CBC WITH DIFFERENTIAL/PLATELET
Basophils Absolute: 0 K/uL (ref 0.0–0.1)
Basophils Relative: 1 % (ref 0.0–3.0)
Eosinophils Absolute: 0.2 K/uL (ref 0.0–0.7)
Eosinophils Relative: 3.7 % (ref 0.0–5.0)
HCT: 43.4 % (ref 39.0–52.0)
Hemoglobin: 14.4 g/dL (ref 13.0–17.0)
Lymphocytes Relative: 33.3 % (ref 12.0–46.0)
Lymphs Abs: 1.4 K/uL (ref 0.7–4.0)
MCHC: 33.2 g/dL (ref 30.0–36.0)
MCV: 91.2 fl (ref 78.0–100.0)
Monocytes Absolute: 0.5 K/uL (ref 0.1–1.0)
Monocytes Relative: 12 % (ref 3.0–12.0)
Neutro Abs: 2.1 K/uL (ref 1.4–7.7)
Neutrophils Relative %: 50 % (ref 43.0–77.0)
Platelets: 283 K/uL (ref 150.0–400.0)
RBC: 4.76 Mil/uL (ref 4.22–5.81)
RDW: 13.4 % (ref 11.5–15.5)
WBC: 4.3 K/uL (ref 4.0–10.5)

## 2024-01-02 LAB — LIPID PANEL
Cholesterol: 148 mg/dL (ref <200)
HDL: 63 mg/dL (ref >=40)
LDL Cholesterol (Calc): 70 mg/dL
Non-HDL Cholesterol (Calc): 85 mg/dL (ref <130)
Total CHOL/HDL Ratio: 2.3 (calc) (ref <5.0)
Triglycerides: 73 mg/dL (ref <150)

## 2024-01-02 LAB — PSA: PSA: 0.5 ng/mL (ref 0.10–4.00)

## 2024-01-02 LAB — HEMOGLOBIN A1C
Hgb A1c MFr Bld: 5.5 % (ref <5.7)
Mean Plasma Glucose: 111 mg/dL
eAG (mmol/L): 6.2 mmol/L

## 2024-01-02 NOTE — Patient Instructions (Addendum)
 Prevnar 20 today   Please stop by lab before you go If you have mychart- we will send your results within 3 business days of us  receiving them.  If you do not have mychart- we will call you about results within 5 business days of us  receiving them.  *please also note that you will see labs on mychart as soon as they post. I will later go in and write notes on them- will say notes from Dr. Katrinka   Recommended follow up: Return in about 1 year (around 01/01/2025) for physical or sooner if needed.Schedule b4 you leave.

## 2024-01-02 NOTE — Progress Notes (Signed)
 Phone: [phone]   Subjective:  Patient presents today for their annual physical. Chief complaint-noted.   See problem oriented charting- ROS- full  review of systems was completed and negative  Per full ROS sheet completed by patient except for topics noted under acute/chronic concerns   The following were reviewed and entered/updated in epic: Past Medical History:  Diagnosis Date   Allergic rhinitis    Allergy Child   Penicillin sulfs drugs   BPH (benign prostatic hyperplasia)    Coronary artery calcification seen on CAT scan    05-03-2021  calcium  score = 54.9, all located LAD/ D1  (cardiologist-- dr calhoun)   ED (erectile dysfunction)    GERD (gastroesophageal reflux disease)    Hiatal hernia    History of iron deficiency anemia    History of kidney stones    Hyperlipidemia    Lower urinary tract symptoms (LUTS)    OSA on CPAP    01-08-2023  per pt uses nightly   Pneumonia    RBBB (right bundle branch block)    Renal calculi 08/2021   right side   Sleep apnea 2020   Patient Active Problem List   Diagnosis Date Noted   nonobstructive CAD (coronary artery disease) 06/01/2021    Priority: High   Aortic atherosclerosis (HCC) 06/01/2021    Priority: Medium    Hyperlipidemia, unspecified 06/01/2021    Priority: Medium    BPH (benign prostatic hyperplasia)     Priority: Medium    G E R D 12/28/2006    Priority: Medium    Erectile dysfunction 09/17/2017    Priority: Low   OSA (obstructive sleep apnea)     Priority: Low   Ureterolithiasis 02/07/2013    Priority: Low   Gastric polyp 12/12/2011    Priority: Low   Diverticulosis 12/07/2011    Priority: Low   Hx of adenomatous colonic polyps 12/07/2011    Priority: Low   Iron deficiency anemia 10/24/2011    Priority: Low   Allergic rhinitis 01/01/2007    Priority: Low   Past Surgical History:  Procedure Laterality Date   COLONOSCOPY WITH PROPOFOL   05/24/2018   by dr albertus   CYSTOSCOPY WITH RETROGRADE  PYELOGRAM, URETEROSCOPY AND STENT PLACEMENT Right 05/14/2013   Procedure: CYSTOSCOPY WITH RETROGRADE PYELOGRAM, URETEROSCOPY AND STENT PLACEMENT;  Surgeon: Ricardo Likens, MD;  Location: [facility];  Service: Urology;  Laterality: Right;   CYSTOSCOPY WITH RETROGRADE PYELOGRAM, URETEROSCOPY AND STENT PLACEMENT Right 09/20/2021   Procedure: CYSTOSCOPY WITH RIGHT RETROGRADE PYELOGRAM, URETEROSCOPY AND STENT PLACEMENT POSSIBLE HOLMIUM LASER LITHOTRIPSY;  Surgeon: Watt Rush, MD;  Location: [facility];  Service: Urology;  Laterality: Right;   CYSTOSCOPY/RETROGRADE/URETEROSCOPY/STONE EXTRACTION WITH BASKET Left 10/25/2023   Procedure: CYSTOSCOPY, WITH CALCULUS REMOVAL USING BASKET;  Surgeon: Shane Steffan BROCKS, MD;  Location: [facility];  Service: Urology;  Laterality: Left;   CYSTOSCOPY/URETEROSCOPY/HOLMIUM LASER/STENT PLACEMENT Right 01/16/2023   Procedure: CYSTOSCOPY RIGHT RETROGRADE PYELOGRAMURETEROSCOPY/HOLMIUM LASER/STENT PLACEMENT;  Surgeon: Watt Rush, MD;  Location: [facility];  Service: Urology;  Laterality: Right;  1 HR FOR CASE   CYSTOSCOPY/URETEROSCOPY/HOLMIUM LASER/STENT PLACEMENT Left 10/25/2023   Procedure: CYSTOSCOPY/URETEROSCOPY/HOLMIUM LASER/STENT PLACEMENT;  Surgeon: Shane Steffan BROCKS, MD;  Location: [facility];  Service: Urology;  Laterality: Left;  CYSTOSCOPY/LEFT URETEROSCOPY/HOLMIUM LASER/STENT PLACEMENT/RETROGRADE PYELOGRAM/BASKET EXTRACTION   ESOPHAGOGASTRODUODENOSCOPY  11/28/2011   by dr pyrtle   EXTRACORPOREAL SHOCK WAVE LITHOTRIPSY Right 02/13/2013   @[facility]    FOOT GANGLION EXCISION Left 1999   HOLMIUM LASER APPLICATION Right 05/14/2013  Procedure: HOLMIUM LASER APPLICATION;  Surgeon: Ricardo Likens, MD;  Location: [facility];  Service: Urology;  Laterality: Right;    Family History  Problem Relation Age of Onset   Arthritis Mother    Cancer Father        unknown- not worked up 76   Diabetes Maternal Grandfather     Heart disease Other        Dad's side - grandfather, grandmother (big MI)   Diabetes Paternal Grandfather    Colon cancer Neg Hx    Colon polyps Neg Hx    Esophageal cancer Neg Hx    Stomach cancer Neg Hx    Rectal cancer Neg Hx     Medications- reviewed and updated Current Outpatient Medications  Medication Sig Dispense Refill   alfuzosin  (UROXATRAL ) 10 MG 24 hr tablet TAKE 1 TABLET BY MOUTH DAILY  WITH BREAKFAST 90 tablet 3   aspirin  EC 81 MG tablet Take 1 tablet (81 mg total) by mouth daily. Swallow whole. 90 tablet 3   famotidine  (PEPCID ) 20 MG tablet Take 1 tablet (20 mg total) by mouth 2 (two) times daily. 180 tablet 3   fexofenadine (ALLEGRA) 60 MG tablet Take 60 mg by mouth as needed.      fluticasone  (FLONASE ) 50 MCG/ACT nasal spray Place 2 sprays into both nostrils daily. 16 g 0   rosuvastatin  (CRESTOR ) 5 MG tablet TAKE 1 TABLET BY MOUTH DAILY 90 tablet 3   sildenafil  (VIAGRA ) 100 MG tablet Take 1 tablet (100 mg total) by mouth daily as needed for erectile dysfunction. 10 tablet 11   No current facility-administered medications for this visit.    Allergies-reviewed and updated Allergies  Allergen Reactions   Penicillins Hives     AND HYPERACTIVITY   Sulfa Antibiotics     Unknown childhood reaction    Social History   Social History Narrative   Married. 2 daughters. 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 for [employer]      Hobbies: workaholic    Objective  Objective:  BP 118/68 (BP Location: Left Arm, Patient Position: Sitting, Cuff Size: Normal)   Pulse 92   Temp 97.9 F (36.6 C) (Temporal)   Ht 6' (1.829 m)   Wt 186 lb 12.8 oz (84.7 kg)   SpO2 96%   BMI 25.33 kg/m  Gen: NAD, resting comfortably HEENT: Mucous membranes are moist.  Oropharynx normal Neck: no thyromegaly CV: RRR no murmurs rubs or gallops Lungs: CTAB no crackles, wheeze, rhonchi Abdomen: soft/nontender/nondistended/normal bowel sounds. No rebound or guarding.  Ext: no edema Skin: warm, dry Neuro: grossly normal, moves all extremities, PERRLA Declines genitourinary and rectal exam   Assessment and Plan  58 y.o. male presenting for annual physical.  Health Maintenance counseling: 1. Anticipatory guidance: Patient counseled regarding regular dental exams -q6 months, eye exams -yearly,  avoiding smoking and second hand smoke , limiting alcohol to 2 beverages per day - 2 a year, no illicit drugs .   2. Risk factor reduction:  Advised patient of need for regular exercise and diet rich and fruits and vegetables to reduce risk of heart attack and stroke.  Exercise- very active with work.  Diet/weight management-Down 4 pounds in last year.  BMI just over 25-still check A1c. Has cut  out sodas for most part- had been more regular. Sometimes misses lunch- thinks that helps as well Wt Readings from Last 3 Encounters:  01/02/24 186 lb 12.8 oz (84.7 kg)  11/16/23 189 lb (85.7 kg)  10/25/23 184 lb (83.5 kg)  3. Immunizations/screenings/ancillary studies-Prevnar 20 today.  Consider flu shot later in the season.  Had hepatitis B vaccinations in childhood. Opts out COVID-19 vaccination Immunization History  Administered Date(s) Administered   Influenza Split 02/26/2013   Influenza,inj,Quad PF,6+ Mos 01/28/2019, 02/16/2022   Influenza-Unspecified 02/16/2016, 01/27/2018   Tdap 10/24/2011, 02/16/2022   Zoster Recombinant(Shingrix ) 01/28/2019, 05/15/2019  4. Prostate cancer screening-  low risk prior trend other than short-term variance in September 2024- update  today  Lab Results  Component Value Date   PSA 0.53 04/11/2023   PSA 1.28 02/20/2023   PSA 0.28 02/16/2022   5. Colon cancer screening - 05/24/2018 with 1 year repeat as no polyps 6. Skin cancer screening-  sees [place] [facility] yearly. advised regular sunscreen use. Denies worrisome, changing, or new skin lesions.  7. Smoking associated screening (lung cancer screening, AAA screen 65-75, UA)- never smoker 8. STD screening - only active with wife  Status of chronic or acute concerns    #Nonobstructive CAD-has seen Dr. Alveta  #hyperlipidemia S: Medication:Rosuvastatin  5 mg daily, aspirin  81 mg, has nitroglycerin  available if needed- has not needed -In 2022-coronary artery calcium  score was 187 which was 90th percentile Lab Results  Component Value Date   CHOL 134 02/20/2023   HDL 57.70 02/20/2023   LDLCALC 63 02/20/2023   TRIG 69.0 02/20/2023   CHOLHDL 2 02/20/2023   A/P: Nonobstructive CAD- no chest pain or shortness of breath- continue current medications  Hyperlipidemia-at goal even for CAD- continue current medications    #BPH S: Medication: Alfuzosin  10 mg extended release.  No nocturia.  A/P: improved- continue current medications  . Cutting out caffeine helped   # GERD with hiatal hernia CT S:Medication: Famotidine  20 mg twice daily-prior omeprazole  20 mg B12 levels related to PPI use: Elevated in 2022 A/P: well controlled continue current medications    #Erectile dysfunction-sildenafil  available if needed-  no refill needed -Knows not to use nitroglycerin  if has used sildenafil  within 24 hours and proceed directly to emergency department   Nephrolithiasis-managed by Dr. Watt with [facility]- no recent issues  Recommended follow up: Return in about 1 year (around 01/01/2025) for physical or sooner if needed.Schedule b4 you leave.  Lab/Order associations:NOT fasting   ICD-10-CM   1. Routine general medical examination at a health care facility  Z00.00     2. Coronary artery disease due to lipid rich plaque  I25.10 Lipid panel   I25.83 CBC w/Diff    Comp Met (CMET)    3. Hyperlipidemia, unspecified hyperlipidemia type  E78.5     4. Vitamin D  deficiency  E55.9      5. Screening for prostate cancer  Z12.5 PSA    6. Screening for diabetes mellitus  Z13.1 Hemoglobin A1c    7. Overweight  E66.3 Hemoglobin A1c      No orders of the defined types were placed in this encounter.   Return precautions advised.  Garnette Lukes, MD

## 2024-01-02 NOTE — Addendum Note (Signed)
 Addended by: Phynix Horton on: 01/02/2024 09:39 AM   Modules accepted: Orders

## 2024-01-03 ENCOUNTER — Ambulatory Visit: Payer: Self-pay | Admitting: Family Medicine

## 2024-01-15 ENCOUNTER — Telehealth: Payer: Self-pay | Admitting: Family Medicine

## 2024-01-15 NOTE — Telephone Encounter (Unsigned)
 Copied from CRM [id number]. Topic: General - Other >> Jan 15, 2024 10:21 AM [place]ique E wrote: Reason for CRM: Patient questioning if he can have proof of his shingles vaccine emailed to the email on file: [email or web address].

## 2024-02-27 ENCOUNTER — Other Ambulatory Visit: Payer: Self-pay | Admitting: Family Medicine

## 2024-04-09 ENCOUNTER — Encounter: Payer: Self-pay | Admitting: Family Medicine

## 2024-04-09 ENCOUNTER — Ambulatory Visit (INDEPENDENT_AMBULATORY_CARE_PROVIDER_SITE_OTHER): Admitting: Family Medicine

## 2024-04-09 VITALS — BP 128/68 | HR 91 | Temp 98.0°F | Ht 72.0 in | Wt 190.6 lb

## 2024-04-09 DIAGNOSIS — E785 Hyperlipidemia, unspecified: Secondary | ICD-10-CM | POA: Diagnosis not present

## 2024-04-09 DIAGNOSIS — K219 Gastro-esophageal reflux disease without esophagitis: Secondary | ICD-10-CM | POA: Diagnosis not present

## 2024-04-09 DIAGNOSIS — I2510 Atherosclerotic heart disease of native coronary artery without angina pectoris: Secondary | ICD-10-CM

## 2024-04-09 DIAGNOSIS — R413 Other amnesia: Secondary | ICD-10-CM | POA: Diagnosis not present

## 2024-04-09 DIAGNOSIS — I2583 Coronary atherosclerosis due to lipid rich plaque: Secondary | ICD-10-CM

## 2024-04-09 NOTE — Patient Instructions (Addendum)
 We have placed a referral for you today to [facility]- please call their # if you do not hear within a week (may be listed below or you may see mychart message within a few days with #).   We will call you within two weeks about your referral to neurology through [facility].  Their phone number is [phone].  Please call them if you have not heard in 1-2 weeks   Recommended follow up: Return for as needed for new, worsening, persistent symptoms.

## 2024-04-09 NOTE — Progress Notes (Signed)
 Phone [phone] In person visit   Subjective:   Lucas Rocha is a 58 y.o. year old very pleasant male patient who presents for/with See problem oriented charting Chief Complaint  Patient presents with   Memory Changes    Pt states over the past several years his memory is getting progressively worse;     Past Medical History-  Patient Active Problem List   Diagnosis Date Noted   nonobstructive CAD (coronary artery disease) 06/01/2021    Priority: High   Aortic atherosclerosis 06/01/2021    Priority: Medium    Hyperlipidemia, unspecified 06/01/2021    Priority: Medium    BPH (benign prostatic hyperplasia)     Priority: Medium    G E R D 12/28/2006    Priority: Medium    Erectile dysfunction 09/17/2017    Priority: Low   OSA (obstructive sleep apnea)     Priority: Low   Ureterolithiasis 02/07/2013    Priority: Low   Gastric polyp 12/12/2011    Priority: Low   Diverticulosis 12/07/2011    Priority: Low   Hx of adenomatous colonic polyps 12/07/2011    Priority: Low   Iron deficiency anemia 10/24/2011    Priority: Low   Allergic rhinitis 01/01/2007    Priority: Low    Medications- reviewed and updated Current Outpatient Medications  Medication Sig Dispense Refill   alfuzosin  (UROXATRAL ) 10 MG 24 hr tablet TAKE 1 TABLET BY MOUTH DAILY  WITH BREAKFAST 90 tablet 3   aspirin  EC 81 MG tablet Take 1 tablet (81 mg total) by mouth daily. Swallow whole. 90 tablet 3   fexofenadine (ALLEGRA) 60 MG tablet Take 60 mg by mouth as needed.      fluticasone  (FLONASE ) 50 MCG/ACT nasal spray Place 2 sprays into both nostrils daily. 16 g 0   ketorolac  (TORADOL ) 10 MG tablet Take 10 mg by mouth every 8 (eight) hours as needed.     omeprazole  (PRILOSEC) 20 MG capsule TAKE 1 CAPSULE BY MOUTH DAILY 90 capsule 3   rosuvastatin  (CRESTOR ) 5 MG tablet TAKE 1 TABLET BY MOUTH DAILY 90 tablet 3   sildenafil  (VIAGRA ) 100 MG tablet Take 1 tablet (100 mg total) by mouth daily as needed for  erectile dysfunction. 10 tablet 11   No current facility-administered medications for this visit.     Objective:  BP 128/68 (BP Location: Left Arm, Patient Position: Sitting, Cuff Size: Normal)   Pulse 91   Temp 98 F (36.7 C) (Temporal)   Ht 6' (1.829 m)   Wt 190 lb 9.6 oz (86.5 kg)   SpO2 96%   BMI 25.85 kg/m  Gen: NAD, resting comfortably CV: RRR no murmurs rubs or gallops Lungs: CTAB no crackles, wheeze, rhonchi Ext: no edema Skin: warm, dry     Assessment and Plan   # Memory loss concern S: Patient states over the past several years he feels like his memory has continued to worsen. Thought it was before retirement in 2023- thought at first it was that he was overworked but ultimately even with reducing stressors has persisted.  - feels like if there is an interjection into what he's doing that he is more likely to forget -writes reminders on his hand -forgets where he places things -short term memory loss family has noted Lab Results  Component Value Date   VITAMINB12 360 09/20/2023  - but takes B12 1000mcg dailyhe believes Lab Results  Component Value Date   TSH 0.48 09/20/2023  -declines HIV and syphilis  testing -declines depressive symptoms -statins can be associated - did start statin around 2022 -difficulty with focus - can feel overstimulated in groups now which is new  Prior Mini-Cog's have been reassuring in 2022 in 2023.  Previously declined MRI brain.  We have thought could be stress related in the past but seems to be progressive  A/P: Patient is extremely concerned about his memory-feels like something is significantly wrong.  Wife has noted the same which heightens my concern.  His MMSE is only mildly decreased at 28 out of 30 but he had significant pause trying to find the season as well as the month which seems highly irregular for him - Does have history of hyperactivity and inattention-wonder if with aging we could be uncovering attention deficit  disorder -Symptoms seem to start sometime after starting rosuvastatin  and we consider holding this we opted for neurology consult first and we may reconsider this at a later date especially with known CAD  #Nonobstructive CAD-has seen Dr. Alveta 07/19/2021  #hyperlipidemia #Aortic atherosclerosis S: Medication:Rosuvastatin  5 mg daily, aspirin  81 mg, has nitroglycerin  available if needed  Lab Results  Component Value Date   CHOL 148 01/02/2024   HDL 63 01/02/2024   LDLCALC 70 01/02/2024   TRIG 73 01/02/2024   CHOLHDL 2.3 01/02/2024    A/P: Nonobstructive CAD asymptomatic-continue current medicine Hyperlipidemia-lipids right at goal with rosuvastatin  but with memory changes consider holding as above-keeping stable for now    # GERD with hiatal hernia CT S:Medication: omeprazole  20 mg B12 levels related to PPI use: Taking B12 with low normal B12 levels last visit A/P: Reflux well-controlled on omeprazole -failed Pepcid  in the past -Omeprazole  can cause low B12 levels which can be associated with memory loss but with flare of symptoms off of medicine we opted to hold steady  Recommended follow up: Return for as needed for new, worsening, persistent symptoms. Future Appointments  Date Time Provider Department Center  01/07/2025  8:00 AM Katrinka Garnette KIDD, MD [facility]    Lab/Order associations:   ICD-10-CM   1. Memory loss  R41.3 MR Brain Wo Contrast    Ambulatory referral to Neurology    2. Coronary artery disease due to lipid rich plaque  I25.10    I25.83     3. Hyperlipidemia, unspecified hyperlipidemia type  E78.5     4. Gastroesophageal reflux disease without esophagitis  K21.9        Return precautions advised.  Garnette Katrinka, MD

## 2024-04-17 ENCOUNTER — Other Ambulatory Visit

## 2024-04-17 ENCOUNTER — Encounter: Payer: Self-pay | Admitting: Physician Assistant

## 2024-04-22 ENCOUNTER — Telehealth: Payer: Self-pay | Admitting: Family Medicine

## 2024-04-22 ENCOUNTER — Ambulatory Visit
Admission: RE | Admit: 2024-04-22 | Discharge: 2024-04-22 | Disposition: A | Discharge status: Home / Self Care | Source: Ambulatory Visit | Attending: Family Medicine | Admitting: Family Medicine

## 2024-04-22 DIAGNOSIS — R413 Other amnesia: Secondary | ICD-10-CM

## 2024-04-22 NOTE — Telephone Encounter (Signed)
 Spoke with patient and will send immunization record via MyChart.   Copied from CRM [id number]. Topic: General - Other >> Apr 22, 2024  1:46 PM Alexandria E wrote: Reason for CRM: Patient called in stating that his employer is needing proof of his biometric screening, flu vaccine, colon cancer screening, shingles vaccine, tetanus vaccine, and the Covid vaccine. Patient would like this emailed to his email on file if doable.

## 2024-04-28 ENCOUNTER — Encounter: Payer: Self-pay | Admitting: Family Medicine

## 2024-04-28 ENCOUNTER — Ambulatory Visit: Payer: Self-pay | Admitting: Family Medicine

## 2024-04-30 MED ORDER — DOXYCYCLINE HYCLATE 100 MG PO TABS: 100.0000 mg | ORAL_TABLET | Freq: Two times a day (BID) | ORAL | 0 refills | Status: AC

## 2024-05-06 ENCOUNTER — Telehealth: Payer: Self-pay | Admitting: Family Medicine

## 2024-05-06 NOTE — Telephone Encounter (Signed)
 Will contact patient again and ask for biometric form.   Copied from CRM [id number]. Topic: Clinical - Medical Advice >> May 06, 2024  9:40 AM Charolett L wrote: Reason for CRM:  Patient is calling for the status of of his biometric screening, flu vaccine, colon cancer screening, shingles vaccine, tetanus vaccine, and the Covid vaccine. Patient would like this emailed to his email on file if doable. Patient is requesting a call back

## 2024-05-12 ENCOUNTER — Other Ambulatory Visit: Payer: Self-pay | Admitting: Family Medicine

## 2024-05-12 NOTE — Progress Notes (Incomplete)
 Memory concerns likely of multiple etiologies  Lucas Rocha is a very pleasant 58 y.o. year old RH male with a history of hypertension, hyperlipidemia, nonobstructive CAD, iron deficiency anemia, OSA, questionable ADHD-ADD per chart seen today for evaluation of memory concerns.  MoCA today is 20/30.SABRA Patient is able to participate on ADLs and to drive without difficulties. Mood is anxious.  Etiology is unclear, workup is in progress.  External causes including iron deficiency anemia, sleep apnea, attention issues, vitamin D  deficiency, and mood changes, medication such as omeprazole , could contribute to memory loss.  Most recent MRI of the brain was essentially normal for age, without any acute findings.  No family history of neurodegenerative disease.  If neuropsych evaluation yields any further testing including potential LP, we will proceed with  these studies.    Neurocognitive testing  to further evaluate cognitive concerns and determine other underlying cause of memory changes, including potential contribution from sleep, anxiety, attention, or depression.  If results are negative, he may need to be evaluated for evolving adult ADD Consider changing omeprazole  to an alternative medication (i.e. lansoprazole) as there is evidence that if taking greater than 3 years, can contribute to memory difficulties.  Recommend continuing evaluation for OSA and affect memory Check B12, TSH Monitor iron deficiency anemia, vitamin D  deficiency, vit D by PCP Continue to control mood as per PCP Recommend good control of cardiovascular risk factors Folllow up pending on the results from neuropsych evaluation    Discussed the use of AI scribe software for clinical note transcription with the patient, who gave verbal consent to proceed.  History of Present Illness Lucas Rocha is a 58 year old male who presents with persistent short-term memory concerns. He is accompanied by his wife.  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 He struggles with retaining new information, such as recent conversations and names of new acquaintances, often needing to write reminders. His long-term memory remains intact, as he can recall events from 25 years ago accurately. He describes an inability to return to a topic after being distracted. His wife notes frequent repetition of questions, such as asking about the time of an event or appointment multiple times.  He denies any history of attention deficit disorders but notes that his recall has always been dependent on his interest in the subject matter. He retired from a high-pressure job as a scientist, research (medical), initially attributing his memory issues to job-related stress, but the symptoms persisted post-retirement. He does not experience disorientation at home or in his neighborhood and does not have wandering tendencies. However, he sometimes feels overwhelmed in group settings with multiple conversations, leading him to remove himself from the situation. This behavior has developed over the past year.  No history of depression, anxiety, hallucinations, or seizures. He sleeps well, using a CPAP machine for sleep apnea, which he has had for at least five years. He is independent in daily activities, including choosing clothing and bathing, and has no significant appetite changes, though he favors sweets.  He has a history of benign prostatic hyperplasia (BPH) for which he takes medication. He experiences urge incontinence but denies any significant bowel issues. He has a history of iron deficiency anemia, B12 deficiency, and vitamin D  deficiency, which were noted during a previous episode of pneumonia. He is currently taking B12 supplements.      MRI brain,04/22/24  personally reviewed, remarkable for R maxillary sinus disease, normal volume, no  atrophy, no acute findings      Past Medical History:  Diagnosis Date   Allergic rhinitis    Allergy Child   Penicillin sulfs drugs   BPH (benign prostatic hyperplasia)    Coronary artery calcification seen on CAT scan    05-03-2021  calcium  score = 54.9, all located LAD/ D1  (cardiologist-- dr calhoun)   ED (erectile dysfunction)    GERD (gastroesophageal reflux disease)    Hiatal hernia    History of iron deficiency anemia    History of kidney stones    Hyperlipidemia    Lower urinary tract symptoms (LUTS)    OSA on CPAP    01-08-2023  per pt uses nightly   Pneumonia    RBBB (right bundle branch block)    Renal calculi 08/2021   right side   Sleep apnea 2020     Past Surgical History:  Procedure Laterality Date   COLONOSCOPY WITH PROPOFOL   05/24/2018   by dr albertus   CYSTOSCOPY WITH RETROGRADE PYELOGRAM, URETEROSCOPY AND STENT PLACEMENT Right 05/14/2013   Procedure: CYSTOSCOPY WITH RETROGRADE PYELOGRAM, URETEROSCOPY AND STENT PLACEMENT;  Surgeon: Ricardo Likens, MD;  Location: [facility];  Service: Urology;  Laterality: Right;   CYSTOSCOPY WITH RETROGRADE PYELOGRAM, URETEROSCOPY AND STENT PLACEMENT Right 09/20/2021   Procedure: CYSTOSCOPY WITH RIGHT RETROGRADE PYELOGRAM, URETEROSCOPY AND STENT PLACEMENT POSSIBLE HOLMIUM LASER LITHOTRIPSY;  Surgeon: Watt Rush, MD;  Location: [facility];  Service: Urology;  Laterality: Right;   CYSTOSCOPY/RETROGRADE/URETEROSCOPY/STONE EXTRACTION WITH BASKET Left 10/25/2023   Procedure: CYSTOSCOPY, WITH CALCULUS REMOVAL USING BASKET;  Surgeon: Shane Steffan BROCKS, MD;  Location: [facility];  Service: Urology;  Laterality: Left;   CYSTOSCOPY/URETEROSCOPY/HOLMIUM LASER/STENT PLACEMENT Right 01/16/2023   Procedure: CYSTOSCOPY RIGHT RETROGRADE PYELOGRAMURETEROSCOPY/HOLMIUM LASER/STENT PLACEMENT;  Surgeon: Watt Rush, MD;  Location: [facility];  Service: Urology;  Laterality: Right;  1 HR FOR CASE    CYSTOSCOPY/URETEROSCOPY/HOLMIUM LASER/STENT PLACEMENT Left 10/25/2023   Procedure: CYSTOSCOPY/URETEROSCOPY/HOLMIUM LASER/STENT PLACEMENT;  Surgeon: Shane Steffan BROCKS, MD;  Location: [facility];  Service: Urology;  Laterality: Left;  CYSTOSCOPY/LEFT URETEROSCOPY/HOLMIUM LASER/STENT PLACEMENT/RETROGRADE PYELOGRAM/BASKET EXTRACTION   ESOPHAGOGASTRODUODENOSCOPY  11/28/2011   by dr pyrtle   EXTRACORPOREAL SHOCK WAVE LITHOTRIPSY Right 02/13/2013   @[facility]    FOOT GANGLION EXCISION Left 1999   HOLMIUM LASER APPLICATION Right 05/14/2013   Procedure: HOLMIUM LASER APPLICATION;  Surgeon: Ricardo Likens, MD;  Location: [facility];  Service: Urology;  Laterality: Right;     Allergies[1]  Current Outpatient Medications  Medication Instructions   alfuzosin  (UROXATRAL ) 10 mg, Oral, Daily with breakfast   aspirin  EC 81 mg, Oral, Daily, Swallow whole.   fexofenadine (ALLEGRA) 60 mg, As needed   fluticasone  (FLONASE ) 50 MCG/ACT nasal spray 2 sprays, Each Nare, Daily   ketorolac  (TORADOL ) 10 mg, Every 8 hours PRN   omeprazole  (PRILOSEC) 20 mg, Oral, Daily   rosuvastatin  (CRESTOR ) 5 mg, Oral, Daily   sildenafil  (VIAGRA ) 100 mg, Oral, Daily PRN     VITALS:   Vitals:   05/14/24 0936  BP: 103/69  Pulse: 88  Resp: 20  SpO2: 98%  Weight: 192 lb (87.1 kg)  Height: 6' (1.829 m)         05/14/2024   12:00 PM  Montreal Cognitive Assessment   Visuospatial/ Executive (0/5) 4  Naming (0/3) 3  Attention: Read list of digits (0/2) 2  Attention: Read list of letters (0/1) 1  Attention: Serial 7 subtraction starting at 100 (0/3) 2  Language: Repeat phrase (0/2) 2  Language : Fluency (0/1) 1  Abstraction (0/2) 2  Delayed Recall (0/5) 0  Orientation (0/6) 3  Total 20  Adjusted Score (based on education) 20       04/09/2024    9:43 AM  MMSE - Mini Mental State Exam  Orientation to time 3  Orientation to Place 5  Registration 3  Attention/ Calculation 5  Recall 3  Language- name 2  objects 2  Language- repeat 1  Language- follow 3 step command 3  Language- read & follow direction 1  Write a sentence 1  Copy design 1  Total score 28     Neurological Exam    Orientation:  Alert and oriented to person, place and not to time. No aphasia or dysarthria. Fund of knowledge is appropriate. Recent memory impaired and remote memory intact.  Attention and concentration are normal.  Able to name objects and repeat phrases. Delayed recall  0/5 Cranial nerves: There is good facial symmetry. Extraocular muscles are intact and visual fields are full to confrontational testing. Speech is fluent and clear. No tongue deviation. Hearing is intact to conversational tone. Tone: Tone is good throughout. Abnormal movements: No tremors. No Asterixis. No Fasciculations Sensation: Sensation is intact to light touch. Vibration is intact at the bilateral big toe.  Coordination: The patient has no difficulty with RAM's or FNF bilaterally. Normal finger to nose  Motor: Strength is 5/5 in the bilateral upper and lower extremities. There is no pronator drift. There are no fasciculations noted. DTR's: Deep tendon reflexes are 2/4 bilaterally. Gait and Station: The patient is able to ambulate without difficulty The patient is able to heel toe walk. Gait is cautious and narrow. The patient is able to ambulate in a tandem fashion.       Thank you for allowing us  the opportunity to participate in the care of this nice patient. Please do not hesitate to contact us  for any questions or concerns.   Total time spent on today's visit was 60 minutes dedicated to this patient today, preparing to see patient, examining the patient, ordering tests and/or medications and counseling the patient, documenting clinical information in the EHR or other health record, independently interpreting results and communicating results to the patient/family, discussing treatment and goals, answering patient's questions and  coordinating care.  Cc:  Katrinka Garnette KIDD, MD  Camie Sevin 05/14/2024 12:46 PM       [1]  Allergies Allergen Reactions   Penicillins Hives     AND HYPERACTIVITY   Sulfa Antibiotics     Unknown childhood reaction

## 2024-05-14 ENCOUNTER — Ambulatory Visit

## 2024-05-14 ENCOUNTER — Encounter: Payer: Self-pay | Admitting: Physician Assistant

## 2024-05-14 ENCOUNTER — Other Ambulatory Visit

## 2024-05-14 ENCOUNTER — Ambulatory Visit: Admitting: Physician Assistant

## 2024-05-14 VITALS — BP 103/69 | HR 88 | Resp 20 | Ht 72.0 in | Wt 192.0 lb

## 2024-05-14 DIAGNOSIS — R413 Other amnesia: Secondary | ICD-10-CM | POA: Insufficient documentation

## 2024-05-14 DIAGNOSIS — R4189 Other symptoms and signs involving cognitive functions and awareness: Secondary | ICD-10-CM | POA: Insufficient documentation

## 2024-05-14 NOTE — Patient Instructions (Signed)
Labs today [address]

## 2024-05-15 ENCOUNTER — Ambulatory Visit: Payer: Self-pay | Admitting: Physician Assistant

## 2024-05-15 LAB — VITAMIN B12: Vitamin B-12: 1375 pg/mL — ABNORMAL HIGH (ref 200–1100)

## 2024-05-15 LAB — TSH: TSH: 0.57 m[IU]/L (ref 0.40–4.50)

## 2024-05-20 ENCOUNTER — Encounter: Payer: Self-pay | Admitting: Family Medicine

## 2024-05-20 ENCOUNTER — Ambulatory Visit: Admitting: Family Medicine

## 2024-05-20 VITALS — BP 124/88 | HR 133 | Temp 101.6°F | Wt 190.6 lb

## 2024-05-20 DIAGNOSIS — J101 Influenza due to other identified influenza virus with other respiratory manifestations: Secondary | ICD-10-CM | POA: Diagnosis not present

## 2024-05-20 DIAGNOSIS — R509 Fever, unspecified: Secondary | ICD-10-CM

## 2024-05-20 DIAGNOSIS — R0981 Nasal congestion: Secondary | ICD-10-CM

## 2024-05-20 LAB — POC COVID19 BINAXNOW: SARS Coronavirus 2 Ag: NEGATIVE

## 2024-05-20 LAB — POCT INFLUENZA A/B
Influenza A, POC: POSITIVE — AB
Influenza B, POC: NEGATIVE

## 2024-05-20 MED ORDER — OSELTAMIVIR PHOSPHATE 75 MG PO CAPS: 75.0000 mg | ORAL_CAPSULE | Freq: Two times a day (BID) | ORAL | 0 refills | Status: AC

## 2024-05-20 MED ORDER — ACETAMINOPHEN 500 MG PO TABS: 1000.0000 mg | ORAL_TABLET | Freq: Once | ORAL | Status: AC

## 2024-05-20 NOTE — Patient Instructions (Signed)
-  It was a pleasure to care for you today.  -POSITIVE for influenza A.  -Prescribed Tamiflu  75mg  tablet, take 1 tablet every 12 hours for 5 days.  -Rest, hydrate. -Alternate Tylenol  1000mg  and Ibuprofen  600-800mg  every 4 hours for pain, headache, and body aches. Recommend to eat something when taking Ibuprofen .   -Provided Tylenol  1000mg  tablet in office today for fever. -Discussed about reason for emergent care. -Follow up if not improved.

## 2024-05-20 NOTE — Progress Notes (Signed)
 "  Acute Office Visit   Subjective:  Patient ID: Lucas Rocha, male    DOB: 08/26/1965, 58 y.o.   MRN: [id number]  Chief Complaint  Patient presents with   Sinus Problem    X1 day     HPI Patient is present for an acute visit. His PCP is Dr. Garnette Lukes.  Patient is having the following symptoms:  Body chills  Nasal congestion/drainage  Headaches  Fever   Denies sore throat, ear pain/pressure, chest pain or SHOB, nausea, and vomiting.   Symptoms started this morning. He hs been around family members who had influenza.  ROS See HPI above      Objective:   BP 124/88   Pulse (!) 133   Temp (!) 101.6 F (38.7 C)   Wt 190 lb 9.6 oz (86.5 kg)   SpO2 98%   BMI 25.85 kg/m    Physical Exam Vitals reviewed.  Constitutional:      General: He is not in acute distress.    Appearance: Normal appearance. He is not ill-appearing, toxic-appearing or diaphoretic.  HENT:     Head: Normocephalic and atraumatic.     Right Ear: Tympanic membrane, ear canal and external ear normal. There is no impacted cerumen.     Left Ear: Tympanic membrane, ear canal and external ear normal. There is no impacted cerumen.     Nose: Congestion and rhinorrhea present.     Mouth/Throat:     Pharynx: Oropharynx is clear. Uvula midline. No pharyngeal swelling, oropharyngeal exudate, posterior oropharyngeal erythema or uvula swelling.  Eyes:     General:        Right eye: No discharge.        Left eye: No discharge.     Conjunctiva/sclera: Conjunctivae normal.  Cardiovascular:     Rate and Rhythm: Normal rate and regular rhythm.     Heart sounds: Normal heart sounds. No murmur heard.    No friction rub. No gallop.  Pulmonary:     Effort: Pulmonary effort is normal. No respiratory distress.     Breath sounds: Normal breath sounds.  Musculoskeletal:        General: Normal range of motion.  Skin:    General: Skin is warm and dry.  Neurological:     General: No focal deficit present.      Mental Status: He is alert and oriented to person, place, and time. Mental status is at baseline.  Psychiatric:        Mood and Affect: Mood normal.        Behavior: Behavior normal.        Thought Content: Thought content normal.        Judgment: Judgment normal.     Results for orders placed or performed in visit on 05/20/24  POC COVID-19 BinaxNow  Result Value Ref Range   SARS Coronavirus 2 Ag Negative Negative  POCT Influenza A/B  Result Value Ref Range   Influenza A, POC Positive (A) Negative   Influenza B, POC Negative Negative      Assessment & Plan:  Fever, unspecified fever cause -     POC COVID-19 BinaxNow -     POCT Influenza A/B -     Acetaminophen   Sinus congestion -     POC COVID-19 BinaxNow -     POCT Influenza A/B  Influenza A -     Oseltamivir  Phosphate; Take 1 capsule (75 mg total) by mouth 2 (two) times daily for 5 days.  Dispense: 10 capsule; Refill: 0  -POSITIVE for influenza A.  -Prescribed Tamiflu  75mg  tablet, take 1 tablet every 12 hours for 5 days.  -Rest, hydrate. -Alternate Tylenol  1000mg  and Ibuprofen  600-800mg  every 4 hours for pain, headache, and body aches. Recommend to eat something when taking Ibuprofen .   -Provided Tylenol  1000mg  tablet in office today for fever. -Discussed about reason for emergent care. -Follow up if not improved.   Adonijah Baena, NP "

## 2024-05-27 ENCOUNTER — Other Ambulatory Visit: Payer: Self-pay

## 2024-05-27 ENCOUNTER — Encounter: Payer: Self-pay | Admitting: Physician Assistant

## 2024-05-27 DIAGNOSIS — R413 Other amnesia: Secondary | ICD-10-CM

## 2024-05-27 NOTE — Progress Notes (Signed)
 Patient has been referred to neuropscy testing unfortuantely Dr.John Corina is booked out and I tried to contact [facility] to get patient in for neuropsy testing and they are only doing in house referrals. Due to age criteria at [facility] we are unable to test him Here. So per Camie Dina RIGGERS. We can order biomarkers. I contaceted patient and he wished to do so he will be here in the am to pick up orders and be checked into the lab.Thanked me so much for calling and he will be able to do do this. Will get it in for the end of the year.

## 2024-05-28 ENCOUNTER — Other Ambulatory Visit

## 2024-06-02 LAB — AD-DETECT ABETA 42/40 AND P-TAU217 EVALUATION, PLASMA
ABETA 40: 250 pg/mL
ABETA 42/40 RATIO: 0.188
ABETA 42: 47 pg/mL
AD DETECT PTAU217: 0.38 pg/mL — ABNORMAL HIGH
LIKELIHOOD SCORE: 0.1535

## 2024-06-03 ENCOUNTER — Other Ambulatory Visit: Payer: Self-pay | Admitting: Physician Assistant

## 2024-06-03 ENCOUNTER — Ambulatory Visit: Payer: Self-pay | Admitting: Physician Assistant

## 2024-06-03 DIAGNOSIS — R413 Other amnesia: Secondary | ICD-10-CM

## 2024-06-24 ENCOUNTER — Ambulatory Visit

## 2024-06-24 ENCOUNTER — Ambulatory Visit: Admitting: Physician Assistant

## 2024-06-26 ENCOUNTER — Encounter: Payer: Self-pay | Admitting: Physician Assistant

## 2024-06-26 ENCOUNTER — Ambulatory Visit: Admitting: Physician Assistant

## 2024-06-26 VITALS — BP 108/66 | HR 98 | Ht 72.0 in | Wt 198.0 lb

## 2024-06-26 DIAGNOSIS — Z860101 Personal history of adenomatous and serrated colon polyps: Secondary | ICD-10-CM

## 2024-06-26 DIAGNOSIS — K2100 Gastro-esophageal reflux disease with esophagitis, without bleeding: Secondary | ICD-10-CM

## 2024-06-26 DIAGNOSIS — K219 Gastro-esophageal reflux disease without esophagitis: Secondary | ICD-10-CM

## 2024-06-26 DIAGNOSIS — Z860100 Personal history of colon polyps, unspecified: Secondary | ICD-10-CM

## 2024-06-26 DIAGNOSIS — K449 Diaphragmatic hernia without obstruction or gangrene: Secondary | ICD-10-CM | POA: Diagnosis not present

## 2024-06-26 MED ORDER — LANSOPRAZOLE 30 MG PO CPDR: 30.0000 mg | DELAYED_RELEASE_CAPSULE | Freq: Every day | ORAL | 2 refills | Status: AC

## 2024-06-26 MED ORDER — LANSOPRAZOLE 30 MG PO CPDR: 30.0000 mg | DELAYED_RELEASE_CAPSULE | Freq: Every day | ORAL | 3 refills | Status: DC

## 2024-06-26 MED ORDER — NA SULFATE-K SULFATE-MG SULF 17.5-3.13-1.6 GM/177ML PO SOLN: 1.0000 | ORAL | 0 refills | Status: AC

## 2024-06-26 NOTE — Patient Instructions (Addendum)
 Stop omeprazole . Start lansoprazole  (Prevacid ) 30 mg 1 tablet once daily before breakfast.  Rx sent to pharmacy.  Proton pump inhibitors (PPIs) do not appear to cause memory impairment or dementia based on the most rigorous evidence available. While some observational studies have suggested an association, high-quality systematic reviews, meta-analyses, and a large randomized controlled trial have found no significant link after accounting for confounding factors.  The [employer] notes that a recent randomized controlled trial found no association between PPI use and dementia. Similarly, the American Gastroenterological Association's expert review acknowledges that while some observational studies reported increased dementia risk, patients who initiate PPIs typically have more baseline comorbidities (including depression, stroke, and polypharmacy), and these differences likely explain the observed associations rather than PPI exposure itself.   ________________________________________________________________________________________________________  Lucas Rocha have been scheduled for an endoscopy and colonoscopy. Please follow the written instructions given to you at your visit today.  If you use inhalers (even only as needed), please bring them with you on the day of your procedure.  I appreciate the opportunity to care for you. Lucas Rocha, PA___________________________________________________________________________

## 2024-06-27 ENCOUNTER — Encounter: Payer: Self-pay | Admitting: Physician Assistant

## 2024-07-01 ENCOUNTER — Institutional Professional Consult (permissible substitution): Payer: Self-pay | Admitting: Psychology

## 2024-07-01 ENCOUNTER — Ambulatory Visit: Payer: Self-pay

## 2024-07-01 ENCOUNTER — Encounter: Payer: Self-pay | Admitting: Psychology

## 2024-07-02 ENCOUNTER — Ambulatory Visit: Admitting: Psychology

## 2024-07-02 ENCOUNTER — Encounter: Payer: Self-pay | Admitting: Psychology

## 2024-07-02 ENCOUNTER — Ambulatory Visit

## 2024-07-02 DIAGNOSIS — F0670 Mild neurocognitive disorder due to known physiological condition without behavioral disturbance: Secondary | ICD-10-CM | POA: Insufficient documentation

## 2024-07-02 DIAGNOSIS — R4189 Other symptoms and signs involving cognitive functions and awareness: Secondary | ICD-10-CM

## 2024-07-02 HISTORY — DX: Mild neurocognitive disorder due to known physiological condition without behavioral disturbance: F06.70

## 2024-07-02 NOTE — Progress Notes (Unsigned)
 "   NEUROPSYCHOLOGICAL EVALUATION [facility] [place] Department of Neurology  Date of Evaluation: July 02, 2024  Reason for Referral:   Lucas Rocha is a 59 y.o. mixed-handed (naturally left-handed) Caucasian male referred by Camie Sevin, PA-C, to characterize his current cognitive functioning and assist with diagnostic clarity and treatment planning in the context of subjective cognitive decline.   Assessment and Plan:   Clinical Impression(s): Results across neurocognitive testing are interpreted by comparing an individual's performances to those of a neurologically healthy age- and often education-matched peer group. This implies that areas of impairment, when present, represent dysfunction beyond the typical aging process. With this understanding, Lucas Rocha' pattern of performance is suggestive of significant cognitive impairment surrounding:  Learning and Memory. Short-term memory is a cognitive domain that involves the ability to briefly hold a limited amount of information in mind for immediate use. It supports tasks such as remembering instructions, numbers, or words over a short period of time. Specifically, Lucas Rocha exhibited primary difficulties surrounding all aspects of verbal memory, including encoding (i.e., learning efficiency), delayed retrieval of previously learned information after a short duration of time had passed, and recognition of previously learned information via cueing. Visual memory performances were normatively adequate presently.   Additional weaknesses and/or performance variability were exhibited across:  Executive Functioning. Executive functioning is a cognitive domain that encompasses higher-order mental processes used to plan, organize, initiate, monitor, and regulate behavior toward goals. It includes abilities such as cognitive flexibility, inhibition, working memory, and problem-solving. These skills support self-control,  decision-making, and adaptation to changing demands.  Performances were appropriate relative to age-matched peers across processing speed, attention/concentration, receptive and expressive language, and visuospatial abilities.   Functionally, Lucas Rocha generally denied difficulties completing instrumental activities of daily living (ADLs) independently. His wife was in agreement. As such, given evidence for cognitive dysfunction described above, he best meets diagnostic criteria for a Mild Neurocognitive Disorder (mild cognitive impairment) at the present time.  The cause for ongoing memory impairment remains uncertain. However, I do feel that concerns surrounding the early stages of a neurodegenerative illness are at least reasonable. Lucas Rocha exhibited very flat learning curves across verbal memory learning trials and was fully amnestic (i.e., 0% retention) across 2/3 of these tasks after a brief delay. He further made the comment of having no recollection of ever being presented with some verbal information before when it had been read to him several times approximately 10 minutes earlier. He also generally did not benefit from cueing or reminders. Taken together, this does elicit concern for rapid forgetting and an evolving and already somewhat prominent storage impairment, which is a testing pattern commonly exhibited in Alzheimer's disease. In addition to testing concerns, recent blood plasma testing suggested a pTau217 elevation, which would further elevate concern for underlying Alzheimer's disease neuropathology. His Abeta42/40 ratio remained within normal limits, which could emphasize the early stages of this illness if it is indeed present. Additional testing to further understand this risk will be important moving forward.  Recommendations: Additional testing in the form of a lumbar puncture or amyloid PET scan could be beneficial in improving diagnostic clarity. This should be discussed  with Ms. Wertman.   If desired, Lucas Rocha could discuss memory medication options with Ms. Wertman as well. It is important to highlight that these medications have been shown to slow functional decline in some individuals. There is no current treatment which can stop or reverse cognitive decline when caused by a  neurodegenerative illness.   A repeat neuropsychological evaluation in 12-24 months is recommended to assess the trajectory of future cognitive decline should it occur. This will also aid in future efforts towards improved diagnostic clarity.  Performance across neurocognitive testing is not a strong predictor of an individual's safety operating a motor vehicle. Should his family wish to pursue a formalized driving evaluation, they could reach out to the following agencies: [employer] in [place]: [phone] [facility]: [phone] [facility]: [phone] [facility]: [phone] or [phone]  Should there be progression of current deficits over time, Lucas Rocha is unlikely to regain any independent living skills lost. Therefore, it is recommended that he remain as involved as possible in all aspects of household chores, finances, and medication management, with supervision to ensure adequate performance. He will likely benefit from the establishment and maintenance of a routine in order to maximize his functional abilities over time.  It will be helpful for Lucas Rocha to have another person with him when in situations where he may need to process information, weigh the pros and cons of different options, and make decisions, in order to ensure that he fully understands and recalls all information to be considered.  Lucas Rocha is encouraged to attend to lifestyle factors for brain health (e.g., regular physical exercise, good nutrition habits and consideration of the MIND-DASH diet, regular participation in cognitively-stimulating  activities, and general stress management techniques), which are likely to have benefits for both emotional adjustment and cognition. Optimal control of vascular risk factors (including safe cardiovascular exercise and adherence to dietary recommendations) is encouraged. Likewise, continued compliance with his CPAP machine will also be important. Continued participation in activities which provide mental stimulation and social interaction is also recommended.   Important information should be provided to Lucas Rocha in written format in all instances. This information should be placed in a highly frequented and easily visible location within his home to promote recall. External strategies such as written notes in a consistently used memory journal, visual and nonverbal auditory cues such as a calendar on the refrigerator or appointments with alarm, such as on a cell phone, can also help maximize recall.  Because he shows some recall for structured information, he may understand and retain new information better if it is presented to him in a meaningful or well-organized manner at the outset, such as grouping items into meaningful categories or presenting information in an outlined, bulleted, or story format.  To address problems with fluctuating attention and/or executive dysfunction, he may wish to consider:   -Avoiding external distractions when needing to concentrate   -Limiting exposure to fast paced environments with multiple sensory demands   -Writing down complicated information and using checklists   -Attempting and completing one task at a time (i.e., no multi-tasking)   -Verbalizing aloud each step of a task to maintain focus   -Taking frequent breaks during the completion of steps/tasks to avoid fatigue   -Reducing the amount of information considered at one time   -Scheduling more difficult activities for a time of day where he is usually most alert  Review of Records:   Past Medical  History:  Diagnosis Date   Allergic rhinitis    Allergy Child   Penicillin sulfs drugs   Aortic atherosclerosis 06/01/2021   On CT cardiac morphology     BPH (benign prostatic hyperplasia)    CAD (coronary artery disease), nonobstructive 06/01/2021   Coronary artery calcification seen on CAT scan  05-03-2021  calcium  score = 54.9, all located LAD/ D1  (cardiologist-- dr calhoun)   Diverticulosis 12/07/2011   Erectile dysfunction 09/17/2017   Gastric polyp 12/12/2011   EGD July 2013, fundic gland        GERD (gastroesophageal reflux disease) 12/28/2006   prilosec 20mg . Zantac doesn't work as well and more expensive     Hiatal hernia    History of adenomatous colonic polyps 12/07/2011   Colonoscopy July 2013     Hyperlipidemia 06/01/2021   Iron deficiency anemia 10/24/2011   Hx 2013- had GI workup- no cause was found and resolved on its own.      Lower urinary tract symptoms (LUTS)    OSA (obstructive sleep apnea)    OSA on cpap as of 2020  oral appliance in past     Pneumonia    RBBB (right bundle branch block)    Ureterolithiasis 02/07/2013   Last in 2014. Sees Dr. Alvaro      Past Surgical History:  Procedure Laterality Date   COLONOSCOPY WITH PROPOFOL   05/24/2018   by dr albertus   CYSTOSCOPY WITH RETROGRADE PYELOGRAM, URETEROSCOPY AND STENT PLACEMENT Right 05/14/2013   Procedure: CYSTOSCOPY WITH RETROGRADE PYELOGRAM, URETEROSCOPY AND STENT PLACEMENT;  Surgeon: Ricardo Alvaro, MD;  Location: [facility];  Service: Urology;  Laterality: Right;   CYSTOSCOPY WITH RETROGRADE PYELOGRAM, URETEROSCOPY AND STENT PLACEMENT Right 09/20/2021   Procedure: CYSTOSCOPY WITH RIGHT RETROGRADE PYELOGRAM, URETEROSCOPY AND STENT PLACEMENT POSSIBLE HOLMIUM LASER LITHOTRIPSY;  Surgeon: Watt Rush, MD;  Location[facility];  Service: Urology;  Laterality: Right;   CYSTOSCOPY/RETROGRADE/URETEROSCOPY/STONE EXTRACTION WITH BASKET Left 10/25/2023   Procedure: CYSTOSCOPY,  WITH CALCULUS REMOVAL USING BASKET;  Surgeon: Shane Steffan BROCKS, MD;  Location: [facility];  Service: Urology;  Laterality: Left;   CYSTOSCOPY/URETEROSCOPY/HOLMIUM LASER/STENT PLACEMENT Right 01/16/2023   Procedure: CYSTOSCOPY RIGHT RETROGRADE PYELOGRAMURETEROSCOPY/HOLMIUM LASER/STENT PLACEMENT;  Surgeon: Watt Rush, MD;  Location: [facility];  Service: Urology;  Laterality: Right;  1 HR FOR CASE   CYSTOSCOPY/URETEROSCOPY/HOLMIUM LASER/STENT PLACEMENT Left 10/25/2023   Procedure: CYSTOSCOPY/URETEROSCOPY/HOLMIUM LASER/STENT PLACEMENT;  Surgeon: Shane Steffan BROCKS, MD;  Location: [facility];  Service: Urology;  Laterality: Left;  CYSTOSCOPY/LEFT URETEROSCOPY/HOLMIUM LASER/STENT PLACEMENT/RETROGRADE PYELOGRAM/BASKET EXTRACTION   ESOPHAGOGASTRODUODENOSCOPY  11/28/2011   by dr pyrtle   EXTRACORPOREAL SHOCK WAVE LITHOTRIPSY Right 02/13/2013   @[facility]    FOOT GANGLION EXCISION Left 1999   HOLMIUM LASER APPLICATION Right 05/14/2013   Procedure: HOLMIUM LASER APPLICATION;  Surgeon: Ricardo Alvaro, MD;  Location: [facility];  Service: Urology;  Laterality: Right;    Current Outpatient Medications:    alfuzosin  (UROXATRAL ) 10 MG 24 hr tablet, TAKE 1 TABLET BY MOUTH DAILY  WITH BREAKFAST, Disp: 90 tablet, Rfl: 3   aspirin  EC 81 MG tablet, Take 1 tablet (81 mg total) by mouth daily. Swallow whole., Disp: 90 tablet, Rfl: 3   fexofenadine (ALLEGRA) 60 MG tablet, Take 60 mg by mouth as needed. , Disp: , Rfl:    fluticasone  (FLONASE ) 50 MCG/ACT nasal spray, Place 2 sprays into both nostrils daily., Disp: 16 g, Rfl: 0   ketorolac  (TORADOL ) 10 MG tablet, Take 10 mg by mouth every 8 (eight) hours as needed., Disp: , Rfl:    lansoprazole  (PREVACID ) 30 MG capsule, Take 1 capsule (30 mg total) by mouth daily at 12 noon., Disp: 30 capsule, Rfl: 2   Na Sulfate-K Sulfate-Mg Sulfate concentrate (SUPREP) 17.5-3.13-1.6 GM/177ML SOLN, Take 1 kit (354 mLs total) by mouth as directed., Disp: 354  mL, Rfl:  0   rosuvastatin  (CRESTOR ) 5 MG tablet, TAKE 1 TABLET BY MOUTH DAILY, Disp: 90 tablet, Rfl: 3   sildenafil  (VIAGRA ) 100 MG tablet, Take 1 tablet (100 mg total) by mouth daily as needed for erectile dysfunction., Disp: 10 tablet, Rfl: 11  Current Facility-Administered Medications:    acetaminophen  (TYLENOL ) tablet 1,000 mg, 1,000 mg, Oral, Once,      04/09/2024    9:43 AM  MMSE - Mini Mental State Exam  Orientation to time 3  Orientation to Place 5  Registration 3  Attention/ Calculation 5  Recall 3  Language- name 2 objects 2  Language- repeat 1  Language- follow 3 step command 3  Language- read & follow direction 1  Write a sentence 1  Copy design 1  Total score 28      05/14/2024   12:00 PM  Montreal Cognitive Assessment   Visuospatial/ Executive (0/5) 4  Naming (0/3) 3  Attention: Read list of digits (0/2) 2  Attention: Read list of letters (0/1) 1  Attention: Serial 7 subtraction starting at 100 (0/3) 2  Language: Repeat phrase (0/2) 2  Language : Fluency (0/1) 1  Abstraction (0/2) 2  Delayed Recall (0/5) 0  Orientation (0/6) 3  Total 20  Adjusted Score (based on education) 20   Neuroimaging: Brain MRI on 04/22/2024 was unremarkable.  Clinical Interview:   The following information was obtained during a clinical interview with Lucas Rocha and his wife prior to cognitive testing.  Cognitive Symptoms: Decreased short-term memory: Endorsed. He described largely generalized memory concerns. With direct questioning, he acknowledged trouble with information retrieval, such as names or details of recent conversations. He also reported some trouble misplacing things. His wife emphasized concerns surrounding rapid forgetting of new information, as well as increased repetition in day-to-day conversation. Difficulties were said to have been present since 2023. Per his wife, they may have mildly progressed over time.  Decreased long-term memory: Denied. Decreased  attention/concentration: Denied. He did note a tendency to zone out when stimuli becomes uninteresting to him. He did not report this dating back to childhood or adolescence; however, his wife stated that this was not necessarily a new tendency.  Reduced processing speed: Denied. Difficulties with executive functions: Endorsed. He and his wife reported a longstanding weakness surrounding multi-tasking. He also stated that things were not falling into place as easily as they have in the past.  Difficulties with emotion regulation: Denied. They also denied any prominent personality changes.  Difficulties with receptive language: Denied. Difficulties with word finding: Denied. Decreased visuoperceptual ability: Denied.  Difficulties completing ADLs: He is independent with medication management. His wife manages finances and bill paying which is longstanding in nature. He continues to drive and noted that he may not take the most direct route to his intended destination. While some of this was intentional (e.g., to avoid an often congested road), other times he noted only realizing that he had taken a longer route while in the midst of driving. No safety concerns were noted.   Additional Medical History: History of traumatic brain injury/concussion: Denied. History of stroke: Denied. History of seizure activity: Denied. History of known exposure to toxins: Denied. Symptoms of chronic pain: Denied. Experience of frequent headaches/migraines: Denied. Frequent instances of dizziness/vertigo: Denied.  Sensory changes: He utilizes reading glasses with benefit and reported a longstanding diminished sense of smell. Other sensory changes/difficulties (e.g., hearing, taste) were denied.  Balance/coordination difficulties: Denied. Other motor difficulties: Denied.  Other medical history:  Blood plasma testing on 05/28/2024 revealed a pTau217 elevation (0.38). His Abeta42/40 ratio remained within normal  limits (0.188).  Sleep History: Estimated hours obtained each night: 7-8 hours.  Difficulties falling asleep: Denied. Difficulties staying asleep: Denied. Feels rested and refreshed upon awakening: Endorsed.  History of snoring: Endorsed. History of waking up gasping for air: Endorsed. Witnessed breath cessation while asleep: Endorsed. He has been diagnosed with obstructive sleep apnea and utilizes his CPAP machine nightly.  History of vivid dreaming: Denied. Excessive movement while asleep: Denied. Instances of acting out his dreams: Denied.  Psychiatric/Behavioral Health History: Depression: He described his current mood as pretty good and denied to his knowledge any previous mental health concerns or formal diagnoses. He did acknowledge some frustration, largely surrounding subjective cognitive decline and memory loss. Current or remote suicidal ideation, intent, or plan was denied.  Anxiety: Denied. Mania: Denied. Trauma History: Denied. Visual/auditory hallucinations: Denied. Delusional thoughts: Denied.  Tobacco: Denied. Alcohol: He denied current alcohol consumption as well as a history of problematic alcohol abuse or dependence.  Recreational drugs: Denied.  Family History: Problem Relation Age of Onset   Arthritis Mother    Memory loss Mother        62s   Cancer Father        unknown- not worked up 66   Diabetes Maternal Grandfather    Diabetes Paternal Grandfather    Heart disease Other        Dad's side - grandfather, grandmother (big MI)   Memory loss Maternal Grandmother        4s   Colon cancer Neg Hx    Colon polyps Neg Hx    Esophageal cancer Neg Hx    Stomach cancer Neg Hx    Rectal cancer Neg Hx    This information was confirmed by Lucas Rocha.  Academic/Vocational History: Highest level of educational attainment: 16 years. 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 Academic performances  were said to often be interest dependant. He described himself as a good (A/B) student in courses relevant to his field of study.  History of developmental delay: Denied. History of grade repetition: Denied. Enrollment in special education courses: Denied. History of LD/ADHD: Denied.  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 He retired in 2023. Medical records do suggest some concern that memory dysfunction contributed to his retirement from this position. He is currently working full-time in an adjacent capacity at a desk job. He denied memory performances impacting current work performances and his wife noted that he is able to manage travel associated with this position without any known issue.    Evaluation Results:   Behavioral Observations: Lucas Rocha was accompanied by his wife, arrived to his appointment on time, and was appropriately dressed and groomed. He appeared alert. Observed gait and station were within normal limits. Gross motor functioning appeared intact upon informal observation and no abnormal movements (e.g., tremors) were noted. His affect was generally relaxed and positive. Spontaneous speech was fluent and word finding difficulties were not observed during the clinical interview. Thought processes were coherent, organized, and normal in content. Insight into his cognitive difficulties appeared adequate.   During testing, sustained attention was appropriate. Task engagement was adequate and he persisted when challenged. Overall, Lucas Rocha was cooperative with the clinical interview and subsequent testing procedures.   Adequacy of Effort: The validity  of neuropsychological testing is limited by the extent to which the individual being tested may be assumed to have exerted adequate effort during testing. Lucas Rocha expressed his intention to perform to the best of his abilities and  exhibited adequate task engagement and persistence. Scores across stand-alone and embedded performance validity measures were variable but generally within expectation. His sole below expectation performance was only a few points off from meeting clinical cut-offs and this score is more likely due to true memory impairment rather than poor engagement in my present opinion. As such, the results of the current evaluation are believed to be a valid representation of Lucas Rocha' current cognitive functioning.  Test Results: Mr. Jungbluth was largely oriented at the time of the current evaluation. He was two days off when stating the current date.  Intellectual abilities based upon educational and vocational attainment were estimated to be in the average range. Premorbid abilities were estimated to be within the average range based upon a single-word reading test.   Processing speed was mildly variable but overall appropriate, ranging from the below average to above average normative ranges. Basic attention was below average. More complex attention (e.g., working memory) was average. Executive functioning was variable, ranging from the exceptionally low to average normative ranges.  Assessed receptive language abilities were average. Likewise, Mr. Comer did not exhibit any difficulties comprehending task instructions and answered all questions asked of him appropriately. Assessed expressive language (e.g., verbal fluency and confrontation naming) was below average to average.     Assessed visuospatial/visuoconstructional abilities were average.    Learning (i.e., encoding) of novel information was below average across a shape learning task but exceptionally low to well below average across all three verbal tasks. Spontaneous delayed recall (i.e., retrieval) of previously learned information was commensurate with performances across learning trials. Retention rates were 0% across a list learning task, 56%  across a story learning task, 0% across a daily living task, and 100% across a shape learning task. Performance across recognition tasks was exceptionally low to below average, suggesting limited evidence for information consolidation.   Results of emotional screening instruments suggested that recent symptoms of generalized anxiety were in the minimal range, while symptoms of depression were within normal limits. A screening instrument assessing recent sleep quality suggested the presence of minimal sleep dysfunction.  Table of Scores:   Note: This summary of test scores accompanies the interpretive report and should not be considered in isolation without reference to the appropriate sections in the text. Descriptors are based on appropriate normative data and may be adjusted based on clinical judgment. Terms such as Within Normal Limits and Outside Normal Limits are used when a more specific description of the test score cannot be determined.       Percentile - Normative Descriptor > 98 - Exceptionally High 91-97 - Well Above Average 75-90 - Above Average 25-74 - Average 9-24 - Below Average 2-8 - Well Below Average < 2 - Exceptionally Low       Validity:   DESCRIPTOR       ACS WC: --- --- Outside Normal Limits  DCT: --- --- Within Normal Limits  NAB EVI: --- --- Within Normal Limits       Orientation:      Raw Score Percentile   NAB Orientation, Form 1 27/29 --- ---       Cognitive Screening:      Raw Score Percentile   SLUMS: 18/30 --- ---  Intellectual Functioning:      Standard Score Percentile   Test of Premorbid Functioning: 92 30 Average       Memory:     NAB Memory Module, Form 1: Standard Score/ T Score Percentile   Total Memory Index 57 <1 Exceptionally Low  List Learning       Total Trials 1-3 10/36 (19) <1 Exceptionally Low    List B 3/12 (37) 9 Below Average    Short Delay Free Recall 2/12 (19) <1 Exceptionally Low    Long Delay Free Recall 0/12 (19)  <1 Exceptionally Low    Retention Percentage 0 (<19) <1 Exceptionally Low    Recognition Discriminability -2 (<19) <1 Exceptionally Low  Shape Learning       Total Trials 1-3 13/27 (38) 12 Below Average    Delayed Recall 5/9 (40) 16 Below Average    Retention Percentage 100 (50) 50 Average    Recognition Discriminability 5 (38) 12 Below Average  Story Learning       Immediate Recall 30/80 (25) 1 Exceptionally Low    Delayed Recall 9/40 (28) 2 Well Below Average    Retention Percentage 56 (29) 2 Well Below Average  Daily Living Memory       Immediate Recall 30/51 (28) 2 Well Below Average    Delayed Recall 0/17 (19) <1 Exceptionally Low    Retention Percentage 0 (<19) <1 Exceptionally Low    Recognition Hits 5/10 (20) <1 Exceptionally Low       Attention/Executive Function:     Trail Making Test (TMT): Raw Score (T Score) Percentile     Part A 34 secs.,  1 error (41) 18 Below Average    Part B 86 secs.,  0 errors (43) 25 Average         Scaled Score Percentile   WAIS-5 Coding: 8 25 Average  WAIS-5 Naming Speed Quantity: 13 84 Above Average       NAB Attention Module, Form 1: T Score Percentile     Digits Forward 42 21 Below Average    Digits Backwards 44 27 Average       D-KEFS Color-Word Interference Test: Raw Score (Scaled Score) Percentile     Color Naming 30 secs. (10) 50 Average    Word Reading 23 secs. (10) 50 Average    Inhibition 97 secs. (3) 1 Exceptionally Low      Total Errors 2 errors (9) 37 Average    Inhibition/Switching 109 secs. (4) 2 Well Below Average      Total Errors 1 error (11) 63 Average       D-KEFS Verbal Fluency Test: Raw Score (Scaled Score) Percentile     Letter Total Correct 31 (8) 25 Average    Category Total Correct 37 (10) 50 Average    Category Switching Total Correct 5 (1) <1 Exceptionally Low    Category Switching Accuracy 2 (1) <1 Exceptionally Low      Total Set Loss Errors 6 (5) 5 Well Below Average      Total Repetition Errors 5  (8) 25 Average       Language:     Verbal Fluency Test: Raw Score (T Score) Percentile     Phonemic Fluency (FAS) 31 (39) 14 Below Average    Animal Fluency 21 (51) 54 Average        NAB Language Module, Form 1: T Score Percentile     Oral Production 42 21 Below Average    Auditory Comprehension 54  66 Average    Reading Comprehension 13/13 --- Within Normal Limits    Naming 31/31 (52) 58 Average    Writing 41 18 Below Average       Visuospatial/Visuoconstruction:      Raw Score Percentile   Clock Drawing: 10/10 --- Within Normal Limits       NAB Spatial Module, Form 1: T Score Percentile     Figure Drawing Copy 54 66 Average        Scaled Score Percentile   WAIS-5 Block Design: 8 25 Average       Mood and Personality:      Raw Score Percentile   Beck Depression Inventory - II: 10 --- Within Normal Limits  PROMIS Anxiety Questionnaire: 10 --- None to Slight       Additional Questionnaires:      Raw Score Percentile   PROMIS Sleep Disturbance Questionnaire: 20 --- None to Slight   Informed Consent and Coding/Compliance:   The current evaluation represents a clinical evaluation for the purposes previously outlined by the referral source and is in no way reflective of a forensic evaluation.   Mr. Blodgett was provided with a verbal description of the nature and purpose of the present neuropsychological evaluation. Also reviewed were the foreseeable risks and/or discomforts and benefits of the procedure, limits of confidentiality, and mandatory reporting requirements of this provider. The patient was given the opportunity to ask questions and receive answers about the evaluation. Oral consent to participate was provided by the patient.   This evaluation was conducted by Arthea KYM Maryland, Ph.D., ABPP-CN, board certified clinical neuropsychologist. Mr. Hollingshed completed a clinical interview with Dr. Maryland, billed as one unit [phone], and 154 minutes of cognitive testing and scoring, billed  as one unit [id number] and four additional units 96139. Psychometrist Evalene Pizza, B.S. assisted Dr. Maryland with test administration and scoring procedures. As a separate and discrete service, one unit [id number] and two units 96133 (173 minutes) were billed for Dr. Loralee time spent in interpretation and report writing.      "

## 2024-07-03 ENCOUNTER — Other Ambulatory Visit (HOSPITAL_COMMUNITY): Payer: Self-pay

## 2024-07-03 ENCOUNTER — Telehealth: Payer: Self-pay

## 2024-07-03 ENCOUNTER — Encounter: Payer: Self-pay | Admitting: Psychology

## 2024-07-03 NOTE — Telephone Encounter (Signed)
 Pharmacy Patient Advocate Encounter   Received notification from Patient Advice Request messages that prior authorization for Lansoprazole  30MG  dr capsules is required/requested.   Insurance verification completed.   The patient is insured through [facility].   Per test claim: PA required; PA submitted to above mentioned insurance via Latent Key/confirmation #/EOC [id number] Status is pending

## 2024-07-03 NOTE — Telephone Encounter (Signed)
 PA request has been Submitted. New Encounter has been or will be created for follow up. For additional info see Pharmacy Prior Auth telephone encounter from 07/03/2024.

## 2024-07-03 NOTE — Progress Notes (Signed)
" ° °  Psychometrician Note   Cognitive testing was administered to Lucas Rocha by Evalene Pizza, B.S. (psychometrist) under the supervision of Dr. Arthea KYM Maryland, Ph.D., ABPP, licensed psychologist on 07/02/2024. Lucas Rocha did not appear overtly distressed by the testing session per behavioral observation or responses across self-report questionnaires. Rest breaks were offered.   The battery of tests administered was selected by Dr. Zachary C. Merz, Ph.D., ABPP with consideration to Lucas Rocha's current level of functioning, the nature of his symptoms, emotional and behavioral responses during interview, level of literacy, observed level of motivation/effort, and the nature of the referral question. This battery was communicated to the psychometrist. Communication between Dr. Arthea KYM Maryland, Ph.D., ABPP and the psychometrist was ongoing throughout the evaluation and Dr. Arthea KYM Maryland, Ph.D., ABPP was immediately accessible at all times. Dr. Zachary C. Merz, Ph.D., ABPP provided supervision to the psychometrist on the date of this service to the extent necessary to assure the quality of all services provided.    Lucas Rocha will return within approximately 1-2 weeks for an interactive feedback session with Dr. Maryland at which time his test performances, clinical impressions, and treatment recommendations will be reviewed in detail. Lucas Rocha understands he can contact our office should he require our assistance before this time.  A total of 154 minutes of billable time were spent face-to-face with Lucas Rocha by the psychometrist. This includes both test administration and scoring time. Billing for these services is reflected in the clinical report generated by Dr. Arthea KYM Maryland, Ph.D., ABPP  This note reflects time spent with the psychometrician and does not include test scores or any clinical interpretations made by Dr. Maryland. The full report will follow in a separate note. "

## 2024-07-10 ENCOUNTER — Encounter: Payer: Self-pay | Admitting: Psychology

## 2024-07-17 ENCOUNTER — Ambulatory Visit: Payer: Self-pay | Admitting: Physician Assistant

## 2024-08-18 ENCOUNTER — Encounter: Admitting: Internal Medicine

## 2025-01-07 ENCOUNTER — Encounter: Admitting: Family Medicine
